# Patient Record
Sex: Female | Born: 1935 | Race: White | Hispanic: No | Smoking: Former smoker
Health system: Southern US, Community
[De-identification: ages and names within clinical notes are randomized; demographics above are authoritative.]

## PROBLEM LIST (undated history)

## (undated) DIAGNOSIS — IMO0002 Reserved for concepts with insufficient information to code with codable children: Secondary | ICD-10-CM

## (undated) DIAGNOSIS — I639 Cerebral infarction, unspecified: Secondary | ICD-10-CM

## (undated) DIAGNOSIS — N200 Calculus of kidney: Secondary | ICD-10-CM

## (undated) DIAGNOSIS — I10 Essential (primary) hypertension: Secondary | ICD-10-CM

## (undated) DIAGNOSIS — E7800 Pure hypercholesterolemia, unspecified: Secondary | ICD-10-CM

## (undated) HISTORY — DX: Essential (primary) hypertension: I10

## (undated) HISTORY — DX: Calculus of kidney: N20.0

## (undated) HISTORY — DX: Pure hypercholesterolemia, unspecified: E78.00

---

## 1965-02-23 HISTORY — PX: BREAST LUMPECTOMY: SHX2

## 1997-08-28 ENCOUNTER — Emergency Department (HOSPITAL_COMMUNITY): Admission: EM | Admit: 1997-08-28 | Discharge: 1997-08-28 | Payer: Self-pay | Admitting: Emergency Medicine

## 2005-11-03 ENCOUNTER — Ambulatory Visit: Payer: Self-pay | Admitting: Gastroenterology

## 2005-11-18 ENCOUNTER — Encounter (INDEPENDENT_AMBULATORY_CARE_PROVIDER_SITE_OTHER): Payer: Self-pay | Admitting: Specialist

## 2005-11-18 ENCOUNTER — Ambulatory Visit: Payer: Self-pay | Admitting: Gastroenterology

## 2006-03-23 ENCOUNTER — Encounter: Admission: RE | Admit: 2006-03-23 | Discharge: 2006-03-29 | Payer: Self-pay | Admitting: Internal Medicine

## 2006-10-06 ENCOUNTER — Encounter (HOSPITAL_COMMUNITY): Admission: RE | Admit: 2006-10-06 | Discharge: 2006-11-29 | Payer: Self-pay | Admitting: Internal Medicine

## 2007-11-23 ENCOUNTER — Ambulatory Visit (HOSPITAL_COMMUNITY): Admission: RE | Admit: 2007-11-23 | Discharge: 2007-11-23 | Payer: Self-pay | Admitting: Internal Medicine

## 2008-06-20 ENCOUNTER — Emergency Department (HOSPITAL_COMMUNITY): Admission: EM | Admit: 2008-06-20 | Discharge: 2008-06-20 | Payer: Self-pay | Admitting: Family Medicine

## 2008-10-09 ENCOUNTER — Encounter (INDEPENDENT_AMBULATORY_CARE_PROVIDER_SITE_OTHER): Payer: Self-pay | Admitting: *Deleted

## 2008-11-27 ENCOUNTER — Ambulatory Visit (HOSPITAL_COMMUNITY): Admission: RE | Admit: 2008-11-27 | Discharge: 2008-11-27 | Payer: Self-pay | Admitting: Internal Medicine

## 2009-04-25 ENCOUNTER — Telehealth: Payer: Self-pay | Admitting: Gastroenterology

## 2010-03-27 NOTE — Progress Notes (Signed)
Summary: Schedule Colonoscopy  Phone Note Outgoing Call Call back at [facility] Phone [phone]   Call placed by: Harlow Mares CMA Duncan Dull),  April 25, 2009 3:00 PM Call placed to: Patient Summary of Call: Left message on patients machine to call back. patient due for colonoscopy Initial call taken by: Harlow Mares CMA Duncan Dull),  April 25, 2009 3:00 PM  Follow-up for Phone Call        Has decided she is NOT going to have the Colonoscopy done. Did not want to give me a reason just repeated twice that she was not going to do it.  Follow-up by: Leanor Kail [facility] [place],  April 26, 2009 11:53 AM

## 2010-07-11 NOTE — Assessment & Plan Note (Signed)
 HEALTHCARE                           GASTROENTEROLOGY OFFICE NOTE   Jane Farley, Jane Farley                       MRN:          [id number]  DATE:11/03/2005                            DOB:          [birth year]    REASON FOR CONSULTATION:  Rectal bleeding.   Mrs. Jane Farley is a pleasant 77 year old white female referred through the  courtesy of Dr. Wylene Simmer, for evaluation.  Since her pregnancy 35 years ago,  she has been suffering from intermittent rectal bleeding consisting of blood  on the toilet tissue.  She may have protrusion of hemorrhoids with mild pain  as well.  Recently she passed some mucus along with blood.  Bowels are  regular.  She denies abdominal pain or melena.   PAST MEDICAL HISTORY:  Unremarkable.   FAMILY HISTORY:  Pertinent for 2 siblings and father with heart disease.   MEDICATIONS:  Boniva, calcium.   SHE HAS NO ALLERGIES.   She neither smokes nor drinks.  She is widowed and retired.   REVIEW OF SYSTEMS:  Reviewed and is negative.   PHYSICAL EXAMINATION:  VITAL SIGNS:  Pulse 64, blood pressure 168/98, weight  123.  HEENT: EOMI. PERRLA. Sclerae are anicteric.  Conjunctivae are pink.  NECK:  Supple without thyromegaly, adenopathy or carotid bruits.  CHEST:  Clear to auscultation and percussion without adventitious sounds.  CARDIAC:  Regular rhythm; normal S1 S2.  There are no murmurs, gallops or  rubs.  ABDOMEN:  Bowel sounds are normoactive.  Abdomen is soft, non-tender and non-  distended.  There are no abdominal masses, tenderness, splenic enlargement  or hepatomegaly.  EXTREMITIES:  Full range of motion.  No cyanosis, clubbing or edema.  RECTAL:  She has small external skin tags and a grade 3 hemorrhoid.  There  are no masses.  Stool is Hemoccult negative.   IMPRESSION:  Rectal bleeding probably secondary to hemorrhoids.   RECOMMENDATION:  1. AnaMantle p.r. q.h.s. for 7 days.  2. Colonoscopy.               Barbette Hair. Arlyce Dice, MD,FACG   RDK/MedQ  DD:  11/03/2005  DT:  11/03/2005  Job #:  [id number]   cc:   Gaspar Garbe, M.D.

## 2010-07-11 NOTE — Letter (Signed)
November 03, 2005     Ms. Jane Farley  [address]  [place], [place]  [address]   RE:  [name], [name]  MRN:  [id number]  /  DOB:  October 09, 1932   Dear Ms. Jane Farley:   It is my pleasure to have treated you recently as a new patient in my  office.  I appreciate your confidence and the opportunity to participate in  your care.   Since I do have a busy inpatient endoscopy schedule and office schedule, my  office hours vary weekly.  I am, however, available for emergency calls  every day through my office.  If I cannot promptly meet an urgent office  appointment, another one of our gastroenterologists will be able to assist  you.   My well-trained staff are prepared to help you at all times.  For  emergencies after office hours, a physician from our gastroenterology  section is always available through my 24-hour answering service.   While you are under my care, I encourage discussion of your questions and  concerns, and I will be happy to return your calls as soon as I am  available.   Once again, I welcome you as a new patient and I look forward to a happy and  healthy relationship.   Sincerely,     Jane Farley. Jane Dice, MD,FACG   RDK/MedQ  DD:  11/03/2005 DT:  11/03/2005 Job #:  [id number]

## 2010-07-11 NOTE — Letter (Signed)
November 03, 2005     Gaspar Garbe, M.D.  [address]  [place], [place] [address]   RE:  Jane Farley, Jane Farley  MRN:  [id number]  /  DOB:  Nov 13, 1932   Upon your kind referral, I had the pleasure of evaluating your patient and I  am pleased to offer my findings. I saw Dariane Natzke in the office today.  Enclosed is a copy of my progress note that details my findings and  recommendations.   Thank you for the opportunity to participate in your patient's care.    Sincerely,      Barbette Hair. Arlyce Dice, MD,FACG   RDK/MedQ  DD:  11/03/2005  DT:  11/03/2005  Job #:  [id number]

## 2010-07-16 ENCOUNTER — Ambulatory Visit (HOSPITAL_COMMUNITY): Payer: Medicare Other | Attending: Internal Medicine

## 2010-07-16 DIAGNOSIS — M810 Age-related osteoporosis without current pathological fracture: Secondary | ICD-10-CM | POA: Insufficient documentation

## 2010-09-21 ENCOUNTER — Emergency Department (HOSPITAL_COMMUNITY)
Admission: EM | Admit: 2010-09-21 | Discharge: 2010-09-21 | Disposition: A | Discharge status: Home / Self Care | Payer: Medicare Other | Attending: Emergency Medicine | Admitting: Emergency Medicine

## 2010-09-21 ENCOUNTER — Inpatient Hospital Stay (INDEPENDENT_AMBULATORY_CARE_PROVIDER_SITE_OTHER)
Admission: RE | Admit: 2010-09-21 | Discharge: 2010-09-21 | Disposition: A | Discharge status: Home / Self Care | Payer: Medicare Other | Source: Ambulatory Visit | Attending: Family Medicine | Admitting: Family Medicine

## 2010-09-21 ENCOUNTER — Emergency Department (HOSPITAL_COMMUNITY): Payer: Medicare Other

## 2010-09-21 DIAGNOSIS — N134 Hydroureter: Secondary | ICD-10-CM | POA: Insufficient documentation

## 2010-09-21 DIAGNOSIS — I714 Abdominal aortic aneurysm, without rupture, unspecified: Secondary | ICD-10-CM | POA: Insufficient documentation

## 2010-09-21 DIAGNOSIS — R109 Unspecified abdominal pain: Secondary | ICD-10-CM

## 2010-09-21 DIAGNOSIS — E7800 Pure hypercholesterolemia, unspecified: Secondary | ICD-10-CM | POA: Insufficient documentation

## 2010-09-21 DIAGNOSIS — M810 Age-related osteoporosis without current pathological fracture: Secondary | ICD-10-CM | POA: Insufficient documentation

## 2010-09-21 DIAGNOSIS — N200 Calculus of kidney: Secondary | ICD-10-CM | POA: Insufficient documentation

## 2010-09-21 DIAGNOSIS — I10 Essential (primary) hypertension: Secondary | ICD-10-CM | POA: Insufficient documentation

## 2010-09-21 LAB — POCT I-STAT, CHEM 8
Calcium, Ion: 1.19 mmol/L (ref 1.12–1.32)
Chloride: 107 mEq/L (ref 96–112)
HCT: 38 % (ref 36.0–46.0)
Sodium: 140 mEq/L (ref 135–145)

## 2010-09-21 LAB — POCT URINALYSIS DIP (DEVICE)
Bilirubin Urine: NEGATIVE
Glucose, UA: 100 mg/dL — AB
Ketones, ur: NEGATIVE mg/dL
Leukocytes, UA: NEGATIVE
Nitrite: NEGATIVE
pH: 5 (ref 5.0–8.0)

## 2010-09-26 ENCOUNTER — Other Ambulatory Visit (HOSPITAL_COMMUNITY): Payer: Self-pay | Admitting: Internal Medicine

## 2010-09-26 DIAGNOSIS — R160 Hepatomegaly, not elsewhere classified: Secondary | ICD-10-CM

## 2010-10-01 ENCOUNTER — Ambulatory Visit (HOSPITAL_COMMUNITY)
Admission: RE | Admit: 2010-10-01 | Discharge: 2010-10-01 | Disposition: A | Discharge status: Home / Self Care | Payer: Medicare Other | Source: Ambulatory Visit | Attending: Internal Medicine | Admitting: Internal Medicine

## 2010-10-01 DIAGNOSIS — R160 Hepatomegaly, not elsewhere classified: Secondary | ICD-10-CM

## 2010-10-01 DIAGNOSIS — I714 Abdominal aortic aneurysm, without rupture, unspecified: Secondary | ICD-10-CM | POA: Insufficient documentation

## 2010-10-01 DIAGNOSIS — K7689 Other specified diseases of liver: Secondary | ICD-10-CM | POA: Insufficient documentation

## 2010-10-01 DIAGNOSIS — R1909 Other intra-abdominal and pelvic swelling, mass and lump: Secondary | ICD-10-CM | POA: Insufficient documentation

## 2010-10-01 MED ORDER — GADOBENATE DIMEGLUMINE 529 MG/ML IV SOLN: 5.0000 mL | Freq: Once | INTRAVENOUS | Status: DC

## 2010-10-02 ENCOUNTER — Telehealth: Payer: Self-pay | Admitting: Gastroenterology

## 2010-10-02 NOTE — Telephone Encounter (Signed)
Pt scheduled to see Willette Cluster NP 10/03/10 @9am . Malachi Bonds to notify pt of appt date and time and fax records.

## 2010-10-03 ENCOUNTER — Encounter: Payer: Self-pay | Admitting: Nurse Practitioner

## 2010-10-03 ENCOUNTER — Ambulatory Visit (INDEPENDENT_AMBULATORY_CARE_PROVIDER_SITE_OTHER): Payer: Medicare Other | Admitting: Nurse Practitioner

## 2010-10-03 VITALS — BP 110/70 | HR 80 | Ht 62.0 in | Wt 128.0 lb

## 2010-10-03 DIAGNOSIS — R933 Abnormal findings on diagnostic imaging of other parts of digestive tract: Secondary | ICD-10-CM

## 2010-10-03 DIAGNOSIS — Z1211 Encounter for screening for malignant neoplasm of colon: Secondary | ICD-10-CM

## 2010-10-03 NOTE — Patient Instructions (Addendum)
We have scheduled the Endoscopic Ultrasound with Dr. Rob Bunting at [facility]. Directions provided.

## 2010-10-03 NOTE — Progress Notes (Signed)
10/03/2010 ANAPAULA SEVERT [id number] 1932-11-16   HISTORY OF PRESENT ILLNESS: Ms. Peeler is a 77 year old female who has not been seen here in several years but was formally followed by Dr. Arlyce Dice. She comes in for evaluation of an abnormal MRI of the abdomen. A few days ago the patient went to the emergency department with severe abdominal/flank pain, was found to have kidney stone which he later passed at home. CT urogram.incidental hepatic lesions. MRI done for followup and suggested that the hepatic lesions were actually hemangiomas but there was an incidental finding of an enhancing soft tissue mass located in the gastric body and left hepatic lobe. Findings discussed C4 disc. Patient had no abdominal pain or weight loss. No nausea vomiting. No signs of gastrointestinal bleeding.   Past Medical History  Diagnosis Date  . Hemorrhoids   . Nephrolithiasis   . Hypercholesterolemia   . Hypertension   . Osteoporosis    Past Surgical History  Procedure Date  . Breast lumpectomy [id number]    left    reports that she quit smoking about 8 years ago. She has never used smokeless tobacco. She reports that she does not drink alcohol or use illicit drugs. family history includes Diabetes in her brother and father. No Known Allergies    Outpatient Encounter Prescriptions as of 10/03/2010  Medication Sig Dispense Refill  . calcium carbonate (OS-CAL) 600 MG TABS Take 600 mg by mouth. Once daily       . losartan (COZAAR) 100 MG tablet Take 100 mg by mouth daily.        . simvastatin (ZOCOR) 20 MG tablet Take 20 mg by mouth at bedtime.        . Zoledronic Acid (RECLAST IV) Inject into the vein. Once a year          REVIEW OF SYSTEMS  : All other systems reviewed and negative except where noted in the History of Present Illness.   PHYSICAL EXAM: General: Well developed white female in no acute distress Head: Normocephalic and atraumatic Eyes:  sclerae anicteric,conjunctive pink. Ears: Normal  auditory acuity Mouth: No deformity or lesions Neck: Supple, no masses.  Lungs: Clear throughout to auscultation Heart: Regular rate and rhythm; no murmurs heard Abdomen: Soft, non distended, nontender. No masses or hepatomegaly noted. Normal Bowel sounds Rectal: not done Musculoskeletal: Symmetrical with no gross deformities  Skin: No lesions on visible extremities Extremities: No edema or deformities noted Neurological: Alert oriented x 4, grossly nonfocal Cervical Nodes:  No significant cervical adenopathy Psychological:  Alert and cooperative. Normal mood and affect  ASSESSMENT AND PLAN;

## 2010-10-03 NOTE — Assessment & Plan Note (Signed)
Our office call the patient March 2011 to schedule her call colonoscopy but patient declined. She may revisit this in the future once evaluation of abnormal MRI of the abdomen is complete and any necessary treatment carried out.

## 2010-10-03 NOTE — Assessment & Plan Note (Addendum)
Incidental finding on MRI of the abdomen:  Soft tissue mass between the gastric body and left hepatic lobe measuring 1.6 x 2.4 x 2.0 cm. This lesion demonstrates heterogeneous T2 signal and nonspecific enhancement. 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 No significant gastric wall thickening is seen. Patient will need an upper endoscopic ultrasound. I spoke with Dr. Christella Hartigan in our practice, he reviewed the MRI, patient will be scheduled for EUS  Of note: the MRI was done to followup on hepatic lesions seen on a CT urogram. The liver lesions felt to represent hemangiomas.

## 2010-10-06 NOTE — Progress Notes (Signed)
I AGREE.

## 2010-10-09 ENCOUNTER — Encounter: Payer: Medicare Other | Admitting: Gastroenterology

## 2010-10-09 ENCOUNTER — Ambulatory Visit (HOSPITAL_COMMUNITY)
Admission: RE | Admit: 2010-10-09 | Discharge: 2010-10-09 | Disposition: A | Discharge status: Home / Self Care | Payer: Medicare Other | Source: Ambulatory Visit | Attending: Gastroenterology | Admitting: Gastroenterology

## 2010-10-09 ENCOUNTER — Other Ambulatory Visit: Payer: Self-pay | Admitting: Gastroenterology

## 2010-10-09 DIAGNOSIS — Z79899 Other long term (current) drug therapy: Secondary | ICD-10-CM | POA: Insufficient documentation

## 2010-10-09 DIAGNOSIS — K319 Disease of stomach and duodenum, unspecified: Secondary | ICD-10-CM | POA: Insufficient documentation

## 2010-10-09 DIAGNOSIS — I10 Essential (primary) hypertension: Secondary | ICD-10-CM | POA: Insufficient documentation

## 2010-10-09 DIAGNOSIS — R1906 Epigastric swelling, mass or lump: Secondary | ICD-10-CM | POA: Insufficient documentation

## 2010-10-09 DIAGNOSIS — E7800 Pure hypercholesterolemia, unspecified: Secondary | ICD-10-CM | POA: Insufficient documentation

## 2010-10-09 DIAGNOSIS — R933 Abnormal findings on diagnostic imaging of other parts of digestive tract: Secondary | ICD-10-CM

## 2010-10-14 ENCOUNTER — Telehealth: Payer: Self-pay

## 2010-10-14 ENCOUNTER — Other Ambulatory Visit: Payer: Self-pay | Admitting: Gastroenterology

## 2010-10-14 DIAGNOSIS — K3189 Other diseases of stomach and duodenum: Secondary | ICD-10-CM

## 2010-10-14 NOTE — Telephone Encounter (Signed)
Pt aware.

## 2010-10-14 NOTE — Telephone Encounter (Signed)
Message copied by Donata Duff on Tue Oct 14, 2010  2:01 PM ------      Message from: Marnette Burgess      Created: Tue Oct 14, 2010  1:52 PM      Regarding: Appointment      Contact: [phone]       Patient is scheduled to see Dr. Violeta Gelinas on 10/29/10 @ 9:20A.M. And needs to arrive at 9:00A.M., pt has not been notified, if you have any questions please call [phone].            Thanks,      Elane Fritz      ----- Message -----         From: Chales Abrahams, CMA         Sent: 10/14/2010  12:55 PM           To: Marnette Burgess            referral to [facility] to consider resection of the mass next to stomach, liver.  Probably can be done laparascopically.                     Can you schedule Elane Fritz thanks

## 2010-10-29 ENCOUNTER — Encounter (INDEPENDENT_AMBULATORY_CARE_PROVIDER_SITE_OTHER): Payer: Self-pay | Admitting: General Surgery

## 2010-10-29 ENCOUNTER — Ambulatory Visit (INDEPENDENT_AMBULATORY_CARE_PROVIDER_SITE_OTHER): Payer: Medicare Other | Admitting: General Surgery

## 2010-10-29 VITALS — BP 170/98 | HR 64 | Temp 97.1°F | Ht 62.0 in | Wt 131.2 lb

## 2010-10-29 DIAGNOSIS — D214 Benign neoplasm of connective and other soft tissue of abdomen: Secondary | ICD-10-CM

## 2010-10-29 NOTE — Progress Notes (Signed)
Subjective:     Patient ID: Jane Farley, female   DOB: Mar 02, 1932, 77 y.o.   MRN: [id number]  HPI  Patient is referred for a stomach mass. Patient developed renal calculi and was seen in emergency department. At that time CT scan demonstrated a stone mass. Further evaluation with upper endoscopy by [facility] was done. MR of the abdomen was then completed. This demonstrates a mass in the stomach juxtaposed to the left hepatic lobe consistent with GI ST tumor. The patient has had no abdominal pain since resolution of her renal calculi. She is eating fine. She is moving her bowels without difficulty. She's had no blood in stools. Review of Systems  Constitutional: Negative.   HENT: Negative.   Eyes: Negative.   Respiratory: Negative.   Cardiovascular: Negative.  Negative for chest pain.  Gastrointestinal: Negative.  Negative for abdominal distention.  Musculoskeletal: Negative.   Neurological: Negative.   Hematological: Negative.        Objective:   Physical Exam  Constitutional: She is oriented to person, place, and time. She appears well-developed and well-nourished.  HENT:  Head: Normocephalic and atraumatic.  Eyes: Conjunctivae are normal. Pupils are equal, round, and reactive to light. Right eye exhibits no discharge.  Neck: Normal range of motion. Neck supple. No tracheal deviation present.  Cardiovascular: Normal rate and regular rhythm.  Exam reveals no friction rub.   Pulmonary/Chest: Effort normal. No respiratory distress. She has no wheezes. She has no rales. She exhibits no tenderness.  Abdominal: Soft. She exhibits no distension. There is no tenderness. There is no rebound and no guarding.  Musculoskeletal: Normal range of motion. She exhibits no tenderness.  Neurological: She is alert and oriented to person, place, and time.  Skin: Skin is warm and dry.  Lymph exam reveals no palpable cervical, supraclavicular, periumbilical, or inguinal lymphadenopathy bilaterally.   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 This will require general anesthesia and hospitalization. Procedure risks and benefits were discussed in some detail with the patient and her friend accompanies her today. These included but were not limited to bleeding, infection, injury to other organs, anastomotic leaks. In light of her incompletely controlled hypertension, we will have Dr. Wylene Simmer see her for preoperative evaluation, optimization of her blood pressure control, and clearance. We look forward to scheduling surgery shortly thereafter. I will be happy to see the patient again if she has further questions, otherwise we will see to scheduling at that time.

## 2010-10-29 NOTE — Patient Instructions (Signed)
We will request medical clearance from Dr Wylene Simmer and ask him to evaluate your blood pressure.  Once we get the clearance for surgery, we will schedule you and let you know.  Do not take any aspirin or blood thinners 5 days preop.

## 2010-11-07 ENCOUNTER — Telehealth (INDEPENDENT_AMBULATORY_CARE_PROVIDER_SITE_OTHER): Payer: Self-pay | Admitting: General Surgery

## 2010-11-07 NOTE — Telephone Encounter (Signed)
I have the clearance but Dr Janee Morn has been out of the office.  Orders are not ready but we should be able to get them Monday from him.

## 2010-12-01 ENCOUNTER — Encounter (HOSPITAL_COMMUNITY)
Admission: RE | Admit: 2010-12-01 | Discharge: 2010-12-01 | Disposition: A | Discharge status: Home / Self Care | Payer: Medicare Other | Source: Ambulatory Visit | Attending: General Surgery | Admitting: General Surgery

## 2010-12-01 ENCOUNTER — Other Ambulatory Visit (INDEPENDENT_AMBULATORY_CARE_PROVIDER_SITE_OTHER): Payer: Self-pay | Admitting: General Surgery

## 2010-12-01 DIAGNOSIS — K3189 Other diseases of stomach and duodenum: Secondary | ICD-10-CM

## 2010-12-01 LAB — CBC
Hemoglobin: 12.7 g/dL (ref 12.0–15.0)
MCV: 90.2 fL (ref 78.0–100.0)
Platelets: 268 10*3/uL (ref 150–400)
RBC: 4.27 MIL/uL (ref 3.87–5.11)
WBC: 7.3 10*3/uL (ref 4.0–10.5)

## 2010-12-01 LAB — BASIC METABOLIC PANEL
CO2: 27 mEq/L (ref 19–32)
Chloride: 101 mEq/L (ref 96–112)
Creatinine, Ser: 0.71 mg/dL (ref 0.50–1.10)
Sodium: 138 mEq/L (ref 135–145)

## 2010-12-01 LAB — SURGICAL PCR SCREEN
MRSA, PCR: NEGATIVE
Staphylococcus aureus: NEGATIVE

## 2010-12-02 NOTE — Progress Notes (Signed)
I called and spoke to [facility] at [facility] preop and told her labs and xray are ok for or.

## 2010-12-08 ENCOUNTER — Inpatient Hospital Stay (HOSPITAL_COMMUNITY)
Admission: RE | Admit: 2010-12-08 | Discharge: 2010-12-12 | DRG: 357 | Disposition: A | Discharge status: Home / Self Care | Payer: Medicare Other | Source: Ambulatory Visit | Attending: General Surgery | Admitting: General Surgery

## 2010-12-08 ENCOUNTER — Other Ambulatory Visit (INDEPENDENT_AMBULATORY_CARE_PROVIDER_SITE_OTHER): Payer: Self-pay | Admitting: General Surgery

## 2010-12-08 DIAGNOSIS — K560 Paralytic ileus: Secondary | ICD-10-CM | POA: Diagnosis not present

## 2010-12-08 DIAGNOSIS — Y838 Other surgical procedures as the cause of abnormal reaction of the patient, or of later complication, without mention of misadventure at the time of the procedure: Secondary | ICD-10-CM | POA: Diagnosis not present

## 2010-12-08 DIAGNOSIS — Z01812 Encounter for preprocedural laboratory examination: Secondary | ICD-10-CM

## 2010-12-08 DIAGNOSIS — K929 Disease of digestive system, unspecified: Secondary | ICD-10-CM | POA: Diagnosis not present

## 2010-12-08 DIAGNOSIS — Z01810 Encounter for preprocedural cardiovascular examination: Secondary | ICD-10-CM

## 2010-12-08 DIAGNOSIS — Y9210 Unspecified residential institution as the place of occurrence of the external cause: Secondary | ICD-10-CM | POA: Diagnosis not present

## 2010-12-08 DIAGNOSIS — R1909 Other intra-abdominal and pelvic swelling, mass and lump: Principal | ICD-10-CM | POA: Diagnosis present

## 2010-12-08 DIAGNOSIS — R110 Nausea: Secondary | ICD-10-CM | POA: Diagnosis not present

## 2010-12-08 DIAGNOSIS — Z01818 Encounter for other preprocedural examination: Secondary | ICD-10-CM

## 2010-12-08 DIAGNOSIS — R5381 Other malaise: Secondary | ICD-10-CM | POA: Diagnosis not present

## 2010-12-08 DIAGNOSIS — D367 Benign neoplasm of other specified sites: Secondary | ICD-10-CM

## 2010-12-08 HISTORY — PX: EXPLORATORY LAPAROTOMY WITH ABDOMINAL MASS EXCISION: SHX5169

## 2010-12-09 NOTE — Op Note (Signed)
NAMELASHAWNNA, LAMBRECHT NO.:  [id number]  MEDICAL RECORD NO.:  [id number]  LOCATION:  5127                         FACILITY:  [facility]  PHYSICIAN:  Gabrielle Dare. Janee Morn, M.D.DATE OF BIRTH:  Apr 05, 1932  DATE OF PROCEDURE:  12/08/2010 DATE OF DISCHARGE:                              OPERATIVE REPORT   PREOPERATIVE DIAGNOSIS:  Gastric mass.  POSTOPERATIVE DIAGNOSIS:  Mass in the falciform ligament.  PROCEDURES: 1. Exploratory laparotomies. 2. Excision of mass, falciform ligament 3. Esophagogastroduodenoscopy.  SURGEON:  Gabrielle Dare. Janee Morn, MD  ASSISTANT:  Anselm Pancoast. Zachery Dakins, MD  ANESTHESIA:  General endotracheal.  HISTORY OF PRESENT ILLNESS:  Ms. Waszak is a 77 year old female, who was noted on CT of the abdomen and pelvis to have a mass that appeared to be between the anterior wall of her stomach in the left lobe of her liver. MR of the abdomen and pelvis demonstrated this mass approximately 2 cm in size on the anterior gastric wall, to the left lobe of the liver. Upper endoscopy had previously been done by Dr. Christella Hartigan from [facility]. 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  The patient presents today for removal of this gastric mass.  PROCEDURE IN DETAIL:  Informed consent was obtained.  The patient was identified in the preop holding area.  She was brought to the operating room.  General endotracheal anesthesia was administered by the anesthesia staff.  She was prepped and draped in sterile fashion after Foley catheter was placed by nursing staff.  Time-out procedure was done.  Upper midline incision was made.  Subcutaneous tissues were dissected down revealing the anterior fascia.  This was divided along the midline and the peritoneal cavity was entered under direct vision without difficulty.  The fascia was opened to the length of  the incision.  Exploration revealed no mass directly on the anterior surface of the stomach; however, there was an approximately 2.2 x 2 cm mass lying on the anterior surface of the stomach arising from the falciform ligaments.  This was somewhat firm.  It had a thin pedicle coming off the falciform.  This was dissected down and margin of the normal falciform was taken and the mass was excised.  It was seemed well encapsulated.  The base of the pedicle was ligated with 2-0 silk.  There was excellent hemostasis.  The stomach was then further explored to make sure there were no other lesions.  The stomach was smooth and no other palpable masses.  The liver was smooth as well.  No other abnormalities were noted.  We then re-evaluated the stomach and again no palpable masses noted anywhere.  This appears to have been the lesion.  It is in the location consistent with the skin.  Decision was made at this time just to make sure that at this time the abdomen was first closed.  All counts were correct.  The fascia was closed with running looped #1 PDS from each end of the incision was closed in the middle.  Subcutaneous tissues were irrigated and skin was closed with staples.  Sponge, needle, and instrument counts were all correct.  Sterile dressing was applied.  We then proceeded with esophagogastroduodenoscopy.  The equipment was brought by the tech from endoscopy.  The upper endoscope was inserted via the patient's mouth down into the esophagus without difficulty.  The esophagus appeared grossly normal.  We advanced the scope into the stomach.  The stomach was insufflated.  There was no mass or discrete abnormality noted in the stomach.  Several pictures were taken.  The scope was advanced down to the second portion of the duodenum.  No ulcers or obstructing lesions or other masses were noted there.  The scope was withdrawn back into the stomach, and we took another look.  Again, no masses or  significant abnormalities were seen. The stomach was decompressed and the scope was removed.  The patient tolerated the procedure well without any apparent complications and was taken to the recovery room in stable condition.     Gabrielle Dare Janee Morn, M.D.     BET/MEDQ  D:  12/08/2010  T:  12/09/2010  Job:  [id number]  cc:   Rachael Fee, MD Gaspar Garbe, M.D. Barbette Hair. Arlyce Dice, MD,FACG  Electronically Signed by Violeta Gelinas M.D. on 12/09/2010 01:32:19 PM

## 2010-12-11 DIAGNOSIS — R5381 Other malaise: Secondary | ICD-10-CM

## 2010-12-15 ENCOUNTER — Encounter (INDEPENDENT_AMBULATORY_CARE_PROVIDER_SITE_OTHER): Payer: Self-pay

## 2010-12-17 ENCOUNTER — Ambulatory Visit (INDEPENDENT_AMBULATORY_CARE_PROVIDER_SITE_OTHER): Payer: Medicare Other | Admitting: General Surgery

## 2010-12-17 ENCOUNTER — Encounter (INDEPENDENT_AMBULATORY_CARE_PROVIDER_SITE_OTHER): Payer: Self-pay | Admitting: General Surgery

## 2010-12-17 VITALS — BP 138/90 | HR 60 | Temp 97.5°F | Resp 24 | Wt 129.0 lb

## 2010-12-17 DIAGNOSIS — R1900 Intra-abdominal and pelvic swelling, mass and lump, unspecified site: Secondary | ICD-10-CM

## 2010-12-17 NOTE — Progress Notes (Signed)
Subjective:     Patient ID: Jane Farley, female   DOB: August 08, 1932, 77 y.o.   MRN: [id number]  HPI  Patient is status post resection of mass falciform ligament. This was a benign vascular tumor. She's been doing well postoperatively. She is not taking any pain medication. She is eating well and moving her bowels. She is sleeping much better than she was in the hospital. Review of Systems     Objective:   Physical Exam On exam her upper midline incision is well-healed. Her abdomen is soft and nontender. The staples were removed from her wound and Steri-Strips were placed.    Assessment:     Doing well Status post resection of mass falciform ligament.   Plan:     No heavy lifting for 6 weeks after surgery. We will plan to see her back as needed.

## 2010-12-17 NOTE — Patient Instructions (Signed)
No lifting for 6 weeks after surgery

## 2010-12-21 NOTE — Discharge Summary (Signed)
  NAMEALEXIYA, Farley NO.:  [id number]  MEDICAL RECORD NO.:  [id number]  LOCATION:  5127                         FACILITY:  [facility]  PHYSICIAN:  Gabrielle Dare. Janee Morn, M.D.DATE OF BIRTH:  1932-03-05  DATE OF ADMISSION:  12/08/2010 DATE OF DISCHARGE:                              DISCHARGE SUMMARY   DISCHARGE DIAGNOSIS:  Status post resection of mass, falciform ligament.  HISTORY OF PRESENT ILLNESS:  Ms. Chestine Farley is a 77 year old female who was noted on CT scan of the abdomen and pelvis to have a mass that appeared to arise from the anterior wall of her stomach.  Upper endoscopy was unrevealing and she underwent MR.  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  HOSPITAL COURSE:  The patient underwent exploration for operative resection.  It was found that this mass actually rose from the falciform ligament.  It was excised with a good margin.  There were no masses of the stomach itself found intraoperatively.  At the completion of that procedure, I also performed an  EGD confirming no intragastric abnormalities.  This was consistent with Dr. Birdena Crandall findings. Postoperatively, the patient remained afebrile and hemodynamically stable.  She initially had some nausea but her mild postoperative ileus resolved.  She tolerated advancement of her diet and is discharged home on postop day 4 in stable condition.  Her pathology report revealed a benign vascular proliferation consistent with either a hemangioma or an AVM.  There were no malignant features.  DISCHARGED DIET:  Regular.  DISCHARGE ACTIVITY:  No lifting.  DISCHARGE MEDICATIONS:  Hydrocodone/APAP 5/325 one to two p.o. q.4 hours as needed for pain.  The patient is also to continue her home medications including losartan, NyQuil, calcium carbonate, Reclast, and simvastatin.  FOLLOWUP:  Follow up in 1 week with  myself.     Gabrielle Dare Janee Morn, M.D.     BET/MEDQ  D:  12/12/2010  T:  12/12/2010  Job:  [id number]  cc:   Rachael Fee, MD Gaspar Garbe, M.D. Barbette Hair. Arlyce Dice, MD,FACG  Electronically Signed by Violeta Gelinas M.D. on 12/21/2010 08:50:09 AM

## 2011-03-17 DIAGNOSIS — R3129 Other microscopic hematuria: Secondary | ICD-10-CM | POA: Diagnosis not present

## 2011-03-17 DIAGNOSIS — N201 Calculus of ureter: Secondary | ICD-10-CM | POA: Diagnosis not present

## 2011-06-16 DIAGNOSIS — R3129 Other microscopic hematuria: Secondary | ICD-10-CM | POA: Diagnosis not present

## 2011-11-05 ENCOUNTER — Other Ambulatory Visit: Payer: Self-pay | Admitting: Dermatology

## 2011-11-05 DIAGNOSIS — C44390 Other specified malignant neoplasm of skin of unspecified parts of face: Secondary | ICD-10-CM | POA: Diagnosis not present

## 2011-11-05 DIAGNOSIS — D485 Neoplasm of uncertain behavior of skin: Secondary | ICD-10-CM | POA: Diagnosis not present

## 2011-11-05 DIAGNOSIS — C44319 Basal cell carcinoma of skin of other parts of face: Secondary | ICD-10-CM | POA: Diagnosis not present

## 2011-12-30 DIAGNOSIS — M810 Age-related osteoporosis without current pathological fracture: Secondary | ICD-10-CM | POA: Diagnosis not present

## 2012-01-11 DIAGNOSIS — Z961 Presence of intraocular lens: Secondary | ICD-10-CM | POA: Diagnosis not present

## 2012-01-11 DIAGNOSIS — H52209 Unspecified astigmatism, unspecified eye: Secondary | ICD-10-CM | POA: Diagnosis not present

## 2012-01-13 DIAGNOSIS — M810 Age-related osteoporosis without current pathological fracture: Secondary | ICD-10-CM | POA: Diagnosis not present

## 2012-01-13 DIAGNOSIS — R82998 Other abnormal findings in urine: Secondary | ICD-10-CM | POA: Diagnosis not present

## 2012-01-13 DIAGNOSIS — E785 Hyperlipidemia, unspecified: Secondary | ICD-10-CM | POA: Diagnosis not present

## 2012-01-13 DIAGNOSIS — I10 Essential (primary) hypertension: Secondary | ICD-10-CM | POA: Diagnosis not present

## 2012-01-18 DIAGNOSIS — N200 Calculus of kidney: Secondary | ICD-10-CM | POA: Diagnosis not present

## 2012-01-18 DIAGNOSIS — E785 Hyperlipidemia, unspecified: Secondary | ICD-10-CM | POA: Diagnosis not present

## 2012-01-18 DIAGNOSIS — Z1331 Encounter for screening for depression: Secondary | ICD-10-CM | POA: Diagnosis not present

## 2012-01-18 DIAGNOSIS — Z0000 Encounter for general adult medical examination without abnormal findings: Secondary | ICD-10-CM | POA: Diagnosis not present

## 2012-01-18 DIAGNOSIS — I10 Essential (primary) hypertension: Secondary | ICD-10-CM | POA: Diagnosis not present

## 2012-01-19 DIAGNOSIS — Z1231 Encounter for screening mammogram for malignant neoplasm of breast: Secondary | ICD-10-CM | POA: Diagnosis not present

## 2012-01-25 DIAGNOSIS — Z1212 Encounter for screening for malignant neoplasm of rectum: Secondary | ICD-10-CM | POA: Diagnosis not present

## 2012-01-27 ENCOUNTER — Other Ambulatory Visit (HOSPITAL_COMMUNITY): Payer: Self-pay | Admitting: *Deleted

## 2012-01-27 DIAGNOSIS — N63 Unspecified lump in unspecified breast: Secondary | ICD-10-CM | POA: Diagnosis not present

## 2012-01-27 DIAGNOSIS — Z853 Personal history of malignant neoplasm of breast: Secondary | ICD-10-CM | POA: Diagnosis not present

## 2012-01-28 ENCOUNTER — Encounter (HOSPITAL_COMMUNITY): Payer: Medicare Other

## 2012-02-03 ENCOUNTER — Ambulatory Visit (HOSPITAL_COMMUNITY)
Admission: RE | Admit: 2012-02-03 | Discharge: 2012-02-03 | Disposition: A | Discharge status: Home / Self Care | Payer: Medicare Other | Source: Ambulatory Visit | Attending: Internal Medicine | Admitting: Internal Medicine

## 2012-02-03 DIAGNOSIS — M810 Age-related osteoporosis without current pathological fracture: Secondary | ICD-10-CM | POA: Insufficient documentation

## 2012-02-03 MED ORDER — ZOLEDRONIC ACID 5 MG/100ML IV SOLN
INTRAVENOUS | Status: AC
  Administered 2012-02-03: 5 mg via INTRAVENOUS
  Filled 2012-02-03: qty 100

## 2012-02-03 MED ORDER — ZOLEDRONIC ACID 5 MG/100ML IV SOLN
5.0000 mg | Freq: Once | INTRAVENOUS | Status: AC
  Administered 2012-02-03: 5 mg via INTRAVENOUS

## 2012-02-05 ENCOUNTER — Inpatient Hospital Stay (HOSPITAL_COMMUNITY): Admission: RE | Admit: 2012-02-05 | Payer: Medicare Other | Source: Ambulatory Visit

## 2012-02-08 DIAGNOSIS — L570 Actinic keratosis: Secondary | ICD-10-CM | POA: Diagnosis not present

## 2012-02-08 DIAGNOSIS — Z85828 Personal history of other malignant neoplasm of skin: Secondary | ICD-10-CM | POA: Diagnosis not present

## 2012-02-29 DIAGNOSIS — Z23 Encounter for immunization: Secondary | ICD-10-CM | POA: Diagnosis not present

## 2012-05-09 DIAGNOSIS — Z85828 Personal history of other malignant neoplasm of skin: Secondary | ICD-10-CM | POA: Diagnosis not present

## 2012-06-06 DIAGNOSIS — R5381 Other malaise: Secondary | ICD-10-CM | POA: Diagnosis not present

## 2012-06-06 DIAGNOSIS — IMO0002 Reserved for concepts with insufficient information to code with codable children: Secondary | ICD-10-CM | POA: Diagnosis not present

## 2012-06-06 DIAGNOSIS — I10 Essential (primary) hypertension: Secondary | ICD-10-CM | POA: Diagnosis not present

## 2012-06-06 DIAGNOSIS — G459 Transient cerebral ischemic attack, unspecified: Secondary | ICD-10-CM | POA: Diagnosis not present

## 2012-06-06 DIAGNOSIS — E785 Hyperlipidemia, unspecified: Secondary | ICD-10-CM | POA: Diagnosis not present

## 2012-06-08 DIAGNOSIS — G459 Transient cerebral ischemic attack, unspecified: Secondary | ICD-10-CM | POA: Diagnosis not present

## 2012-06-08 DIAGNOSIS — I658 Occlusion and stenosis of other precerebral arteries: Secondary | ICD-10-CM | POA: Diagnosis not present

## 2012-06-08 DIAGNOSIS — G9389 Other specified disorders of brain: Secondary | ICD-10-CM | POA: Diagnosis not present

## 2012-06-09 DIAGNOSIS — I10 Essential (primary) hypertension: Secondary | ICD-10-CM | POA: Diagnosis not present

## 2012-06-09 DIAGNOSIS — E785 Hyperlipidemia, unspecified: Secondary | ICD-10-CM | POA: Diagnosis not present

## 2012-06-09 DIAGNOSIS — I6350 Cerebral infarction due to unspecified occlusion or stenosis of unspecified cerebral artery: Secondary | ICD-10-CM | POA: Diagnosis not present

## 2012-06-09 DIAGNOSIS — IMO0002 Reserved for concepts with insufficient information to code with codable children: Secondary | ICD-10-CM | POA: Diagnosis not present

## 2012-06-13 ENCOUNTER — Other Ambulatory Visit (HOSPITAL_COMMUNITY): Payer: Self-pay | Admitting: Internal Medicine

## 2012-06-13 DIAGNOSIS — I639 Cerebral infarction, unspecified: Secondary | ICD-10-CM

## 2012-06-14 ENCOUNTER — Other Ambulatory Visit: Payer: Self-pay

## 2012-06-14 ENCOUNTER — Ambulatory Visit (HOSPITAL_COMMUNITY): Payer: Medicare Other | Attending: Cardiovascular Disease | Admitting: Radiology

## 2012-06-14 DIAGNOSIS — I6789 Other cerebrovascular disease: Secondary | ICD-10-CM

## 2012-06-14 DIAGNOSIS — I10 Essential (primary) hypertension: Secondary | ICD-10-CM | POA: Insufficient documentation

## 2012-06-14 DIAGNOSIS — E785 Hyperlipidemia, unspecified: Secondary | ICD-10-CM | POA: Insufficient documentation

## 2012-06-14 DIAGNOSIS — Z8673 Personal history of transient ischemic attack (TIA), and cerebral infarction without residual deficits: Secondary | ICD-10-CM | POA: Insufficient documentation

## 2012-06-14 DIAGNOSIS — I079 Rheumatic tricuspid valve disease, unspecified: Secondary | ICD-10-CM | POA: Diagnosis not present

## 2012-06-14 DIAGNOSIS — Z87891 Personal history of nicotine dependence: Secondary | ICD-10-CM | POA: Insufficient documentation

## 2012-06-14 DIAGNOSIS — I639 Cerebral infarction, unspecified: Secondary | ICD-10-CM

## 2012-06-14 NOTE — Progress Notes (Signed)
Echocardiogram performed.  

## 2012-06-15 ENCOUNTER — Encounter (HOSPITAL_COMMUNITY): Payer: Self-pay | Admitting: Internal Medicine

## 2012-06-15 NOTE — Progress Notes (Signed)
Echo Report faxed to Dr. Wylene Simmer

## 2012-06-23 DIAGNOSIS — H5340 Unspecified visual field defects: Secondary | ICD-10-CM | POA: Diagnosis not present

## 2012-06-23 DIAGNOSIS — H532 Diplopia: Secondary | ICD-10-CM | POA: Diagnosis not present

## 2012-06-23 DIAGNOSIS — H5010 Unspecified exotropia: Secondary | ICD-10-CM | POA: Diagnosis not present

## 2012-07-19 DIAGNOSIS — IMO0002 Reserved for concepts with insufficient information to code with codable children: Secondary | ICD-10-CM | POA: Diagnosis not present

## 2012-07-19 DIAGNOSIS — I10 Essential (primary) hypertension: Secondary | ICD-10-CM | POA: Diagnosis not present

## 2012-07-19 DIAGNOSIS — E785 Hyperlipidemia, unspecified: Secondary | ICD-10-CM | POA: Diagnosis not present

## 2012-07-19 DIAGNOSIS — R5383 Other fatigue: Secondary | ICD-10-CM | POA: Diagnosis not present

## 2012-07-19 DIAGNOSIS — I6350 Cerebral infarction due to unspecified occlusion or stenosis of unspecified cerebral artery: Secondary | ICD-10-CM | POA: Diagnosis not present

## 2012-07-19 DIAGNOSIS — M810 Age-related osteoporosis without current pathological fracture: Secondary | ICD-10-CM | POA: Diagnosis not present

## 2012-07-25 DIAGNOSIS — H532 Diplopia: Secondary | ICD-10-CM | POA: Diagnosis not present

## 2012-07-25 DIAGNOSIS — H5010 Unspecified exotropia: Secondary | ICD-10-CM | POA: Diagnosis not present

## 2012-07-28 DIAGNOSIS — Z09 Encounter for follow-up examination after completed treatment for conditions other than malignant neoplasm: Secondary | ICD-10-CM | POA: Diagnosis not present

## 2012-09-28 DIAGNOSIS — H532 Diplopia: Secondary | ICD-10-CM | POA: Diagnosis not present

## 2012-09-28 DIAGNOSIS — H5010 Unspecified exotropia: Secondary | ICD-10-CM | POA: Diagnosis not present

## 2012-10-03 ENCOUNTER — Other Ambulatory Visit: Payer: Self-pay | Admitting: Dermatology

## 2012-10-03 DIAGNOSIS — C44319 Basal cell carcinoma of skin of other parts of face: Secondary | ICD-10-CM | POA: Diagnosis not present

## 2012-10-03 DIAGNOSIS — D2330 Other benign neoplasm of skin of unspecified part of face: Secondary | ICD-10-CM | POA: Diagnosis not present

## 2012-10-11 ENCOUNTER — Emergency Department (INDEPENDENT_AMBULATORY_CARE_PROVIDER_SITE_OTHER)
Admission: EM | Admit: 2012-10-11 | Discharge: 2012-10-11 | Disposition: A | Discharge status: Nursing Home (Includes ICF) | Payer: Medicare Other | Source: Home / Self Care

## 2012-10-11 ENCOUNTER — Emergency Department (HOSPITAL_COMMUNITY)
Admission: EM | Admit: 2012-10-11 | Discharge: 2012-10-11 | Disposition: A | Discharge status: Home / Self Care | Payer: Medicare Other | Attending: Emergency Medicine | Admitting: Emergency Medicine

## 2012-10-11 ENCOUNTER — Encounter (HOSPITAL_COMMUNITY): Payer: Self-pay

## 2012-10-11 ENCOUNTER — Encounter (HOSPITAL_COMMUNITY): Payer: Self-pay | Admitting: Emergency Medicine

## 2012-10-11 ENCOUNTER — Emergency Department (HOSPITAL_COMMUNITY): Payer: Medicare Other

## 2012-10-11 DIAGNOSIS — Z8679 Personal history of other diseases of the circulatory system: Secondary | ICD-10-CM | POA: Diagnosis not present

## 2012-10-11 DIAGNOSIS — Z8673 Personal history of transient ischemic attack (TIA), and cerebral infarction without residual deficits: Secondary | ICD-10-CM

## 2012-10-11 DIAGNOSIS — Z7982 Long term (current) use of aspirin: Secondary | ICD-10-CM | POA: Insufficient documentation

## 2012-10-11 DIAGNOSIS — F29 Unspecified psychosis not due to a substance or known physiological condition: Secondary | ICD-10-CM | POA: Diagnosis not present

## 2012-10-11 DIAGNOSIS — R51 Headache: Secondary | ICD-10-CM | POA: Diagnosis not present

## 2012-10-11 DIAGNOSIS — Z79899 Other long term (current) drug therapy: Secondary | ICD-10-CM | POA: Diagnosis not present

## 2012-10-11 DIAGNOSIS — E7800 Pure hypercholesterolemia, unspecified: Secondary | ICD-10-CM | POA: Diagnosis not present

## 2012-10-11 DIAGNOSIS — Z87442 Personal history of urinary calculi: Secondary | ICD-10-CM | POA: Diagnosis not present

## 2012-10-11 DIAGNOSIS — I10 Essential (primary) hypertension: Secondary | ICD-10-CM | POA: Insufficient documentation

## 2012-10-11 DIAGNOSIS — Z87891 Personal history of nicotine dependence: Secondary | ICD-10-CM | POA: Insufficient documentation

## 2012-10-11 DIAGNOSIS — R410 Disorientation, unspecified: Secondary | ICD-10-CM

## 2012-10-11 LAB — POCT I-STAT, CHEM 8
BUN: 10 mg/dL (ref 6–23)
Creatinine, Ser: 0.6 mg/dL (ref 0.50–1.10)
Hemoglobin: 12.2 g/dL (ref 12.0–15.0)
Potassium: 3.8 mEq/L (ref 3.5–5.1)
Sodium: 140 mEq/L (ref 135–145)
TCO2: 23 mmol/L (ref 0–100)

## 2012-10-11 LAB — DIFFERENTIAL
Basophils Absolute: 0 10*3/uL (ref 0.0–0.1)
Basophils Relative: 0 % (ref 0–1)
Eosinophils Absolute: 0.1 10*3/uL (ref 0.0–0.7)
Eosinophils Relative: 1 % (ref 0–5)
Neutrophils Relative %: 77 % (ref 43–77)

## 2012-10-11 LAB — CBC
MCH: 30.4 pg (ref 26.0–34.0)
MCHC: 34.1 g/dL (ref 30.0–36.0)
MCV: 89.1 fL (ref 78.0–100.0)
Platelets: 229 10*3/uL (ref 150–400)
RBC: 4.14 MIL/uL (ref 3.87–5.11)
RDW: 13.3 % (ref 11.5–15.5)

## 2012-10-11 LAB — COMPREHENSIVE METABOLIC PANEL
ALT: 8 U/L (ref 0–35)
Albumin: 3.6 g/dL (ref 3.5–5.2)
Alkaline Phosphatase: 46 U/L (ref 39–117)
Calcium: 8.8 mg/dL (ref 8.4–10.5)
GFR calc Af Amer: >90 mL/min (ref >90)
Glucose, Bld: 104 mg/dL — ABNORMAL HIGH (ref 70–99)
Potassium: 4.4 mEq/L (ref 3.5–5.1)
Sodium: 139 mEq/L (ref 135–145)
Total Protein: 6.3 g/dL (ref 6.0–8.3)

## 2012-10-11 LAB — PROTIME-INR
INR: 0.95 (ref 0.00–1.49)
Prothrombin Time: 12.5 seconds (ref 11.6–15.2)

## 2012-10-11 LAB — GLUCOSE, CAPILLARY

## 2012-10-11 MED ORDER — MORPHINE SULFATE 4 MG/ML IJ SOLN
4.0000 mg | Freq: Once | INTRAMUSCULAR | Status: DC
  Filled 2012-10-11: qty 1

## 2012-10-11 MED ORDER — ACETAMINOPHEN 325 MG PO TABS
650.0000 mg | ORAL_TABLET | Freq: Once | ORAL | Status: AC
  Administered 2012-10-11: 650 mg via ORAL
  Filled 2012-10-11: qty 2

## 2012-10-11 MED ORDER — ONDANSETRON HCL 4 MG/2ML IJ SOLN
4.0000 mg | Freq: Once | INTRAMUSCULAR | Status: DC
  Filled 2012-10-11: qty 2

## 2012-10-11 MED ORDER — SODIUM CHLORIDE 0.9 % IV SOLN
Freq: Once | INTRAVENOUS | Status: AC
  Administered 2012-10-11: 10:00:00 via INTRAVENOUS

## 2012-10-11 NOTE — ED Notes (Signed)
[facility] called and given report. 

## 2012-10-11 NOTE — ED Notes (Signed)
Pt. Woke up with a headache between 2-3 am and also feeling tired.  Pt. Went to out urgent care and was transferred via [facility] for further eval.  [facility] reports pt. Ambulated to [place] without any difficulty.  Alert and oriented X4.  Continues to have the headache..  No neuro deficits noted.

## 2012-10-11 NOTE — ED Provider Notes (Signed)
CSN: [id number]     Arrival date & time 10/11/12  1055 History   First MD Initiated Contact with Patient 10/11/12 1057     Chief Complaint  Patient presents with  . Headache   HPI Comments: Pt woke up with a headache this am.  She has history of strokes and was concerned.  She went to an UC and was sent to the ED for further evaluation.  No slurred speech.  NO weakness or numbness.  No trouble with speech.  Patient is a 79 y.o. female presenting with headaches. The history is provided by the patient.  Headache Pain location:  Occipital Quality:  Sharp Radiates to:  Does not radiate Severity currently:  6/10 Onset quality: Pt woke up with a headache around 2am.  She does not usually have headaches. Timing:  Constant Chronicity:  New Similar to prior headaches: no   Context: not activity, not exposure to bright light, not caffeine, not eating, not stress and not loud noise   Worsened by:  Nothing tried Associated symptoms: no abdominal pain, no blurred vision, no dizziness, no fever, no focal weakness, no hearing loss, no nausea, no photophobia and no seizures     Past Medical History  Diagnosis Date  . Hemorrhoids   . Nephrolithiasis   . Hypercholesterolemia   . Hypertension   . Osteoporosis    Past Surgical History  Procedure Laterality Date  . Breast lumpectomy  02/23/1965    left  . Exploratory laparotomy with abdominal mass excision  12/08/2010   Family History  Problem Relation Age of Onset  . Diabetes Father   . Diabetes Brother    History  Substance Use Topics  . Smoking status: Former Smoker    Quit date: 03/04/2002  . Smokeless tobacco: Never Used  . Alcohol Use: No   OB History   Grav Para Term Preterm Abortions TAB SAB Ect Mult Living                 Review of Systems  Constitutional: Negative for fever.  HENT: Negative for hearing loss.   Eyes: Negative for blurred vision and photophobia.  Gastrointestinal: Negative for nausea and abdominal pain.   Neurological: Positive for headaches. Negative for dizziness, focal weakness and seizures.  All other systems reviewed and are negative.    Allergies  Review of patient's allergies indicates no known allergies.  Home Medications   Current Outpatient Rx  Name  Route  Sig  Dispense  Refill  . aspirin EC 81 MG tablet   Oral   Take 81 mg by mouth daily.         . calcium carbonate (OS-CAL) 600 MG TABS   Oral   Take 600 mg by mouth daily.          Marland Kitchen losartan (COZAAR) 100 MG tablet   Oral   Take 100 mg by mouth daily.           . simvastatin (ZOCOR) 20 MG tablet   Oral   Take 20 mg by mouth at bedtime.           . Zoledronic Acid (RECLAST IV)   Intravenous   Inject into the vein. Once a year           BP 113/73  Pulse 72  Temp(Src) 97.9 F (36.6 C) (Oral)  Resp 22  SpO2 96% Physical Exam  Nursing note and vitals reviewed. Constitutional: She is oriented to person, place, and time. She appears well-developed  and well-nourished. No distress.  HENT:  Head: Normocephalic and atraumatic.  Right Ear: External ear normal.  Left Ear: External ear normal.  Mouth/Throat: Oropharynx is clear and moist.  Eyes: Conjunctivae are normal. Right eye exhibits no discharge. Left eye exhibits no discharge. No scleral icterus.  Neck: Neck supple. No tracheal deviation present.  Cardiovascular: Normal rate, regular rhythm and intact distal pulses.   Pulmonary/Chest: Effort normal and breath sounds normal. No stridor. No respiratory distress. She has no wheezes. She has no rales.  Abdominal: Soft. Bowel sounds are normal. She exhibits no distension. There is no tenderness. There is no rebound and no guarding.  Musculoskeletal: She exhibits no edema and no tenderness.  Neurological: She is alert and oriented to person, place, and time. She has normal strength. No cranial nerve deficit ( no gross defecits noted) or sensory deficit. She exhibits normal muscle tone. She displays no  seizure activity. Coordination normal.  No pronator drift bilateral upper extrem, able to hold both legs off bed for 5 seconds, sensation intact in all extremities, no visual field cuts, no left or right sided neglect; oriented to month and year  Skin: Skin is warm and dry. No rash noted.  Psychiatric: She has a normal mood and affect.    ED Course  Procedures (including critical care time)  Labs Reviewed  COMPREHENSIVE METABOLIC PANEL - Abnormal; Notable for the following:    Glucose, Bld 104 (*)    GFR calc non Af Amer 81 (*)    All other components within normal limits  GLUCOSE, CAPILLARY - Abnormal; Notable for the following:    Glucose-Capillary 108 (*)    All other components within normal limits  POCT I-STAT, CHEM 8 - Abnormal; Notable for the following:    Calcium, Ion 1.06 (*)    All other components within normal limits  PROTIME-INR  APTT  CBC  DIFFERENTIAL   Ct Head Wo Contrast  10/11/2012   *RADIOLOGY REPORT*  Clinical Data: Headache  CT HEAD WITHOUT CONTRAST  Technique:  Contiguous axial images were obtained from the base of the skull through the vertex without contrast.  Comparison: None  Findings: Low density in the left putamen compatible with chronic ischemia.  Low densities in the sub insular white matter on the left also compatible with chronic ischemia. Mild chronic microvascular ischemia in the periventricular white matter.  Negative for acute infarct, hemorrhage, or mass lesion.  Ventricle size is normal.  2 cm retention cyst left maxillary sinus.  No focal skull abnormality.  IMPRESSION: Chronic ischemic changes left basal ganglia.  No acute abnormality.   Original Report Authenticated By: Janeece Riggers, M.D.   1. Headache     MDM  Patient has a normal neurologic exam. She's not complaining any difficulties with weakness or numbness. Patient's CT scan does not show any evidence of intracerebral hemorrhage or any other abnormality. The patient was offered pain  medications but just requested a Tylenol. Have a low suspicion for subarachnoid hemorrhage or meningitis. Feel that lumbar puncture is not necessary at this time.  Discussed the findings with the patient and her son. Warning signs and questions were discussed.  Celene Kras, MD 10/11/12 1400

## 2012-10-11 NOTE — ED Provider Notes (Addendum)
Jane Farley is a 79 y.o. female who presents to Urgent Care today for headache confusion and dizziness. The symptoms started at about 2:00 in the morning however the patient does not 100% sure. She notes a splitting headache associated with some dizziness. Her son notes that she seems to be a bit confused. She also notes fatigue. She denies any weakness numbness difficulty swallowing or speaking fevers or chills. She denies any nausea vomiting or diarrhea. She has not taken any medication for headache. She takes her medications at night. Her past medical history significant for a stroke several months ago   PMH reviewed. Recent stroke History  Substance Use Topics  . Smoking status: Former Smoker    Quit date: 03/04/2002  . Smokeless tobacco: Never Used  . Alcohol Use: No   ROS as above Medications reviewed. Current Facility-Administered Medications  Medication Dose Route Frequency Provider Last Rate Last Dose  . 0.9 %  sodium chloride infusion   Intravenous Once Rodolph Bong, MD       Current Outpatient Prescriptions  Medication Sig Dispense Refill  . calcium carbonate (OS-CAL) 600 MG TABS Take 600 mg by mouth. Once daily       . losartan (COZAAR) 100 MG tablet Take 100 mg by mouth daily.        . simvastatin (ZOCOR) 20 MG tablet Take 20 mg by mouth at bedtime.        . Zoledronic Acid (RECLAST IV) Inject into the vein. Once a year         Exam:  BP 187/107  Pulse 67  Temp(Src) 98.1 F (36.7 C) (Oral)  Resp 16  SpO2 99% Gen: Well NAD HEENT: EOMI,  MMM, PERRLA Lungs: CTABL Nl WOB Heart: RRR no MRG Abd: NABS, NT, ND Exts: Non edematous BL  LE, warm and well perfused.  Neuro: Alert. Not oriented to date. Not oriented to who the president is. Patient is oriented to location month year and day of the week.  Normal speech.  Cranial nerves II through XII are intact Sensation and strength are intact in bilateral upper and lower extremities Coordination is intact bilateral upper  extremities  No results found for this or any previous visit (from the past 24 hour(s)). No results found.  EKG: Normal sinus rhythm at 63 beats per minute. Flat lateral precordial T waves. No ST elevation or depression.  Assessment and Plan: 79 y.o. female with possible repeat stroke. Patient has a headache and some mild confusion without any other clear neurologic deficits. Her recent history significant for stroke.  I am concerned enough that I feel that she should be transferred to the emergency room for further evaluation and management.  Patient and son both expressed understanding and agreement.  We'll transfer via EMS for fear of fall with dizziness.  Started on IV at [facility]. Oxygen therapy and monitoring while pending transfer.       Rodolph Bong, MD 10/11/12 1018  Rodolph Bong, MD 10/11/12 1020

## 2012-10-11 NOTE — ED Notes (Signed)
Pt. Eating graham crackers and sprite

## 2012-10-11 NOTE — ED Notes (Signed)
Pt c/o headache onset this am around 0200/0300 BP is 182/107 upon arrival... Also states she feels very tired Hx of stroke back in April Denies: numbness/tingly, slurry speech, weakness Alert and talking to Dr. Denyse Amass w/no signs of acute distress.

## 2012-10-18 DIAGNOSIS — M810 Age-related osteoporosis without current pathological fracture: Secondary | ICD-10-CM | POA: Diagnosis not present

## 2012-10-18 DIAGNOSIS — D214 Benign neoplasm of connective and other soft tissue of abdomen: Secondary | ICD-10-CM | POA: Diagnosis not present

## 2012-10-18 DIAGNOSIS — I10 Essential (primary) hypertension: Secondary | ICD-10-CM | POA: Diagnosis not present

## 2012-10-18 DIAGNOSIS — R413 Other amnesia: Secondary | ICD-10-CM | POA: Diagnosis not present

## 2012-10-18 DIAGNOSIS — I6350 Cerebral infarction due to unspecified occlusion or stenosis of unspecified cerebral artery: Secondary | ICD-10-CM | POA: Diagnosis not present

## 2012-10-18 DIAGNOSIS — E785 Hyperlipidemia, unspecified: Secondary | ICD-10-CM | POA: Diagnosis not present

## 2012-10-18 DIAGNOSIS — Z23 Encounter for immunization: Secondary | ICD-10-CM | POA: Diagnosis not present

## 2012-10-19 ENCOUNTER — Emergency Department (HOSPITAL_COMMUNITY)
Admission: EM | Admit: 2012-10-19 | Discharge: 2012-10-19 | Disposition: A | Discharge status: Home / Self Care | Payer: Medicare Other | Attending: Emergency Medicine | Admitting: Emergency Medicine

## 2012-10-19 ENCOUNTER — Emergency Department (HOSPITAL_COMMUNITY): Payer: Medicare Other

## 2012-10-19 ENCOUNTER — Encounter (HOSPITAL_COMMUNITY): Payer: Self-pay | Admitting: Emergency Medicine

## 2012-10-19 DIAGNOSIS — Z7982 Long term (current) use of aspirin: Secondary | ICD-10-CM | POA: Insufficient documentation

## 2012-10-19 DIAGNOSIS — Z79899 Other long term (current) drug therapy: Secondary | ICD-10-CM | POA: Insufficient documentation

## 2012-10-19 DIAGNOSIS — E7800 Pure hypercholesterolemia, unspecified: Secondary | ICD-10-CM | POA: Diagnosis not present

## 2012-10-19 DIAGNOSIS — H5340 Unspecified visual field defects: Secondary | ICD-10-CM | POA: Diagnosis not present

## 2012-10-19 DIAGNOSIS — Z8719 Personal history of other diseases of the digestive system: Secondary | ICD-10-CM | POA: Insufficient documentation

## 2012-10-19 DIAGNOSIS — M810 Age-related osteoporosis without current pathological fracture: Secondary | ICD-10-CM | POA: Diagnosis not present

## 2012-10-19 DIAGNOSIS — Z87442 Personal history of urinary calculi: Secondary | ICD-10-CM | POA: Diagnosis not present

## 2012-10-19 DIAGNOSIS — H538 Other visual disturbances: Secondary | ICD-10-CM | POA: Diagnosis not present

## 2012-10-19 DIAGNOSIS — R51 Headache: Secondary | ICD-10-CM | POA: Diagnosis not present

## 2012-10-19 DIAGNOSIS — I10 Essential (primary) hypertension: Secondary | ICD-10-CM | POA: Diagnosis not present

## 2012-10-19 DIAGNOSIS — Z87891 Personal history of nicotine dependence: Secondary | ICD-10-CM | POA: Insufficient documentation

## 2012-10-19 DIAGNOSIS — G459 Transient cerebral ischemic attack, unspecified: Secondary | ICD-10-CM | POA: Insufficient documentation

## 2012-10-19 DIAGNOSIS — R6889 Other general symptoms and signs: Secondary | ICD-10-CM | POA: Diagnosis not present

## 2012-10-19 DIAGNOSIS — R42 Dizziness and giddiness: Secondary | ICD-10-CM | POA: Diagnosis not present

## 2012-10-19 LAB — CBC WITH DIFFERENTIAL/PLATELET
Hemoglobin: 12.7 g/dL (ref 12.0–15.0)
Lymphocytes Relative: 18 % (ref 12–46)
Lymphs Abs: 1.6 10*3/uL (ref 0.7–4.0)
MCV: 90.5 fL (ref 78.0–100.0)
Monocytes Relative: 6 % (ref 3–12)
Neutrophils Relative %: 74 % (ref 43–77)
Platelets: 211 10*3/uL (ref 150–400)
RBC: 4.02 MIL/uL (ref 3.87–5.11)
WBC: 8.6 10*3/uL (ref 4.0–10.5)

## 2012-10-19 LAB — URINALYSIS, ROUTINE W REFLEX MICROSCOPIC
Bilirubin Urine: NEGATIVE
Ketones, ur: NEGATIVE mg/dL
Nitrite: NEGATIVE
Protein, ur: NEGATIVE mg/dL
Urobilinogen, UA: 0.2 mg/dL (ref 0.0–1.0)
pH: 7 (ref 5.0–8.0)

## 2012-10-19 LAB — COMPREHENSIVE METABOLIC PANEL
ALT: 8 U/L (ref 0–35)
Alkaline Phosphatase: 46 U/L (ref 39–117)
BUN: 13 mg/dL (ref 6–23)
CO2: 24 mEq/L (ref 19–32)
GFR calc Af Amer: >90 mL/min (ref >90)
GFR calc non Af Amer: 81 mL/min — ABNORMAL LOW (ref >90)
Glucose, Bld: 105 mg/dL — ABNORMAL HIGH (ref 70–99)
Potassium: 3.9 mEq/L (ref 3.5–5.1)
Sodium: 137 mEq/L (ref 135–145)
Total Bilirubin: 0.5 mg/dL (ref 0.3–1.2)

## 2012-10-19 LAB — POCT I-STAT TROPONIN I: Troponin i, poc: 0.01 ng/mL (ref 0.00–0.08)

## 2012-10-19 LAB — URINE MICROSCOPIC-ADD ON

## 2012-10-19 MED ORDER — CLOPIDOGREL BISULFATE 75 MG PO TABS: 75.0000 mg | ORAL_TABLET | Freq: Every day | ORAL | Status: DC

## 2012-10-19 NOTE — ED Provider Notes (Signed)
CSN: [id number]     Arrival date & time 10/19/12  1114 History   First MD Initiated Contact with Patient 10/19/12 1157     Chief Complaint  Patient presents with  . Headache  . Visual Field Change    HPI   Patient has history of CVA in April of this year. She had some loss of vision. She waited 3 days. She saw her physician. She states he told her that she had a stroke.. was placed on aspirin. She takes a baby aspirin every night. She was watching television last night when she states her vision on one side became poor. She cannot recall which side. She still wears a grid on her right lens or glasses because of difficulty with vision related to her previous stroke. She did not have a headache. She states she "just didn't feel good". She denies headache nausea chest pain palpitations numbness weakness tingling disuse of extremities or other symptoms now. Past Medical History  Diagnosis Date  . Hemorrhoids   . Nephrolithiasis   . Hypercholesterolemia   . Hypertension   . Osteoporosis    Past Surgical History  Procedure Laterality Date  . Breast lumpectomy  02/23/1965    left  . Exploratory laparotomy with abdominal mass excision  12/08/2010   Family History  Problem Relation Age of Onset  . Diabetes Father   . Diabetes Brother    History  Substance Use Topics  . Smoking status: Former Smoker    Quit date: 03/04/2002  . Smokeless tobacco: Never Used  . Alcohol Use: No   OB History   Grav Para Term Preterm Abortions TAB SAB Ect Mult Living                 Review of Systems  Constitutional: Negative for fever, chills, diaphoresis, appetite change and fatigue.  HENT: Negative for sore throat, mouth sores and trouble swallowing.   Eyes: Positive for visual disturbance. Negative for photophobia.  Respiratory: Negative for cough, chest tightness, shortness of breath and wheezing.   Cardiovascular: Negative for chest pain.  Gastrointestinal: Negative for nausea, vomiting,  abdominal pain, diarrhea and abdominal distention.  Endocrine: Negative for polydipsia, polyphagia and polyuria.  Genitourinary: Negative for dysuria, frequency and hematuria.  Musculoskeletal: Negative for gait problem.  Skin: Negative for color change, pallor and rash.  Neurological: Positive for headaches. Negative for dizziness, syncope and light-headedness.  Hematological: Does not bruise/bleed easily.  Psychiatric/Behavioral: Negative for behavioral problems and confusion.    Allergies  Review of patient's allergies indicates no known allergies.  Home Medications   Current Outpatient Rx  Name  Route  Sig  Dispense  Refill  . aspirin EC 81 MG tablet   Oral   Take 81 mg by mouth daily.         . calcium carbonate (OS-CAL) 600 MG TABS   Oral   Take 600 mg by mouth daily.          Marland Kitchen losartan (COZAAR) 100 MG tablet   Oral   Take 100 mg by mouth daily.           . simvastatin (ZOCOR) 20 MG tablet   Oral   Take 20 mg by mouth at bedtime.           . Zoledronic Acid (RECLAST IV)   Intravenous   Inject into the vein. Once a year          . clopidogrel (PLAVIX) 75 MG tablet   Oral  Take 1 tablet (75 mg total) by mouth daily.   30 tablet   0    BP 170/89  Pulse 61  Temp(Src) 99.1 F (37.3 C) (Oral)  Resp 23  Wt 125 lb (56.7 kg)  BMI 22.86 kg/m2  SpO2 96% Physical Exam  Constitutional: She is oriented to person, place, and time. She appears well-developed and well-nourished. No distress.  HENT:  Head: Normocephalic.  Eyes: Conjunctivae are normal. Pupils are equal, round, and reactive to light. No scleral icterus.  Neck: Normal range of motion. Neck supple. No thyromegaly present.  Cardiovascular: Normal rate and regular rhythm.  Exam reveals no gallop and no friction rub.   No murmur heard. Sinus rhythm on the monitor  Pulmonary/Chest: Effort normal and breath sounds normal. No respiratory distress. She has no wheezes. She has no rales.  Abdominal:  Soft. Bowel sounds are normal. She exhibits no distension. There is no tenderness. There is no rebound.  Musculoskeletal: Normal range of motion.  Neurological: She is alert and oriented to person, place, and time.  Her cranial nerves are intact and symmetric. She has no visual field deficit to confrontation. She has no upper or lower facial droop. She has normal sensation to the face. Normal palate, normal tongue protrusion. Normal shoulder shrug. No pronator drift. Normal use of extremities. She has normal gait observed she walked to the room  Skin: Skin is warm and dry. No rash noted.  Psychiatric: She has a normal mood and affect. Her behavior is normal.    ED Course  Procedures (including critical care time)  EKG: Indication possible stroke. Interpretation sinus rhythm. Normal intervals. No acute ischemic changes. No ectopy.  Labs Review Labs Reviewed  COMPREHENSIVE METABOLIC PANEL - Abnormal; Notable for the following:    Glucose, Bld 105 (*)    GFR calc non Af Amer 81 (*)    All other components within normal limits  URINALYSIS, ROUTINE W REFLEX MICROSCOPIC - Abnormal; Notable for the following:    Hgb urine dipstick LARGE (*)    All other components within normal limits  URINE CULTURE  CBC WITH DIFFERENTIAL  URINE MICROSCOPIC-ADD ON  POCT I-STAT TROPONIN I   Imaging Review Ct Head Wo Contrast  10/19/2012   *RADIOLOGY REPORT*  Clinical Data: Vertigo, visual changes and headache.  History of prior cerebral infarction.  CT HEAD WITHOUT CONTRAST  Technique:  Contiguous axial images were obtained from the base of the skull through the vertex without contrast.  Comparison: 10/11/2012  Findings: Stable small vessel disease and old lacunar infarcts on the left.  The brain demonstrates no evidence of hemorrhage, infarction, edema, mass effect, extra-axial fluid collection, hydrocephalus or mass lesion.  The skull is unremarkable.  IMPRESSION: Stable head CT demonstrating chronic small  vessel disease and old lacunar infarcts.  No acute abnormalities are identified.   Original Report Authenticated By: Irish Lack, M.D.    MDM   1. TIA (transient ischemic attack)    No sign of acute stroke clinically now this may have been a TIA. EKG CT head labs pending urinalysis pending will reevaluate.  On reevaluation she states she feels "much better". No neurological symptoms at this time. Has small vessel disease on head CT but no acute changes.  She takes a full aspirin daily. Her discontinue this in favor of Plavix. Asked her to call her physician tomorrow regarding this change. Recheck with any symptoms do not resolve within one hours time.    Claudean Kinds, MD 10/19/12  1535 

## 2012-10-19 NOTE — ED Notes (Signed)
Pt arrives via EMS from home with sx overnight similar to previous stroke. States around lastnight had leftsided headache and partial vision loss. Pt then went to bed. Woke with headaceh resolved but just "didnt feel right." Called EMs. 12 lead unremarkable. Initial bp on scene 210/120, 20g rhand placed, cbg 155. Denies pain, alert, oriented x4, NAD at present.

## 2012-10-19 NOTE — ED Notes (Signed)
Pt aware we need urine sample.  

## 2012-10-19 NOTE — ED Notes (Signed)
Pt stated she was not able to provide urine specimen at this time. Urine cup placed at bedside.

## 2012-10-20 LAB — URINE CULTURE: Culture: NO GROWTH

## 2012-11-21 ENCOUNTER — Other Ambulatory Visit: Payer: Self-pay | Admitting: Dermatology

## 2012-11-21 DIAGNOSIS — L259 Unspecified contact dermatitis, unspecified cause: Secondary | ICD-10-CM | POA: Diagnosis not present

## 2012-11-21 DIAGNOSIS — C44319 Basal cell carcinoma of skin of other parts of face: Secondary | ICD-10-CM | POA: Diagnosis not present

## 2012-11-21 DIAGNOSIS — Z85828 Personal history of other malignant neoplasm of skin: Secondary | ICD-10-CM | POA: Diagnosis not present

## 2012-11-21 DIAGNOSIS — C4401 Basal cell carcinoma of skin of lip: Secondary | ICD-10-CM | POA: Diagnosis not present

## 2012-11-22 ENCOUNTER — Encounter (HOSPITAL_COMMUNITY): Payer: Self-pay | Admitting: Cardiology

## 2012-11-22 ENCOUNTER — Inpatient Hospital Stay (HOSPITAL_COMMUNITY)
Admission: EM | Admit: 2012-11-22 | Discharge: 2012-11-25 | DRG: 065 | Disposition: A | Discharge status: Skilled Nursing Facility | Payer: Medicare Other | Attending: Internal Medicine | Admitting: Internal Medicine

## 2012-11-22 ENCOUNTER — Emergency Department (HOSPITAL_COMMUNITY): Payer: Medicare Other

## 2012-11-22 DIAGNOSIS — Z833 Family history of diabetes mellitus: Secondary | ICD-10-CM | POA: Diagnosis not present

## 2012-11-22 DIAGNOSIS — Q211 Atrial septal defect: Secondary | ICD-10-CM | POA: Diagnosis not present

## 2012-11-22 DIAGNOSIS — R4789 Other speech disturbances: Secondary | ICD-10-CM | POA: Diagnosis not present

## 2012-11-22 DIAGNOSIS — Z7902 Long term (current) use of antithrombotics/antiplatelets: Secondary | ICD-10-CM | POA: Diagnosis not present

## 2012-11-22 DIAGNOSIS — Z87891 Personal history of nicotine dependence: Secondary | ICD-10-CM | POA: Diagnosis not present

## 2012-11-22 DIAGNOSIS — I639 Cerebral infarction, unspecified: Secondary | ICD-10-CM | POA: Diagnosis present

## 2012-11-22 DIAGNOSIS — E785 Hyperlipidemia, unspecified: Secondary | ICD-10-CM | POA: Diagnosis present

## 2012-11-22 DIAGNOSIS — M810 Age-related osteoporosis without current pathological fracture: Secondary | ICD-10-CM | POA: Diagnosis present

## 2012-11-22 DIAGNOSIS — Z8673 Personal history of transient ischemic attack (TIA), and cerebral infarction without residual deficits: Secondary | ICD-10-CM | POA: Diagnosis not present

## 2012-11-22 DIAGNOSIS — E7800 Pure hypercholesterolemia, unspecified: Secondary | ICD-10-CM | POA: Diagnosis present

## 2012-11-22 DIAGNOSIS — R29898 Other symptoms and signs involving the musculoskeletal system: Secondary | ICD-10-CM | POA: Diagnosis present

## 2012-11-22 DIAGNOSIS — R471 Dysarthria and anarthria: Secondary | ICD-10-CM | POA: Diagnosis present

## 2012-11-22 DIAGNOSIS — R4701 Aphasia: Secondary | ICD-10-CM | POA: Diagnosis present

## 2012-11-22 DIAGNOSIS — R29810 Facial weakness: Secondary | ICD-10-CM | POA: Diagnosis present

## 2012-11-22 DIAGNOSIS — Z8249 Family history of ischemic heart disease and other diseases of the circulatory system: Secondary | ICD-10-CM | POA: Diagnosis not present

## 2012-11-22 DIAGNOSIS — I6789 Other cerebrovascular disease: Secondary | ICD-10-CM | POA: Diagnosis not present

## 2012-11-22 DIAGNOSIS — I10 Essential (primary) hypertension: Secondary | ICD-10-CM | POA: Diagnosis not present

## 2012-11-22 DIAGNOSIS — I369 Nonrheumatic tricuspid valve disorder, unspecified: Secondary | ICD-10-CM | POA: Diagnosis not present

## 2012-11-22 DIAGNOSIS — I6340 Cerebral infarction due to embolism of unspecified cerebral artery: Secondary | ICD-10-CM | POA: Diagnosis not present

## 2012-11-22 DIAGNOSIS — I119 Hypertensive heart disease without heart failure: Secondary | ICD-10-CM | POA: Diagnosis not present

## 2012-11-22 DIAGNOSIS — I69920 Aphasia following unspecified cerebrovascular disease: Secondary | ICD-10-CM | POA: Diagnosis not present

## 2012-11-22 DIAGNOSIS — G8190 Hemiplegia, unspecified affecting unspecified side: Secondary | ICD-10-CM | POA: Diagnosis present

## 2012-11-22 DIAGNOSIS — G459 Transient cerebral ischemic attack, unspecified: Secondary | ICD-10-CM | POA: Diagnosis not present

## 2012-11-22 DIAGNOSIS — I6350 Cerebral infarction due to unspecified occlusion or stenosis of unspecified cerebral artery: Principal | ICD-10-CM

## 2012-11-22 DIAGNOSIS — Q2111 Secundum atrial septal defect: Secondary | ICD-10-CM

## 2012-11-22 DIAGNOSIS — I679 Cerebrovascular disease, unspecified: Secondary | ICD-10-CM | POA: Diagnosis not present

## 2012-11-22 DIAGNOSIS — Z79899 Other long term (current) drug therapy: Secondary | ICD-10-CM | POA: Diagnosis not present

## 2012-11-22 DIAGNOSIS — Z86718 Personal history of other venous thrombosis and embolism: Secondary | ICD-10-CM | POA: Diagnosis not present

## 2012-11-22 HISTORY — DX: Cerebral infarction, unspecified: I63.9

## 2012-11-22 LAB — CBC WITH DIFFERENTIAL/PLATELET
Lymphocytes Relative: 19 % (ref 12–46)
Lymphs Abs: 1.2 10*3/uL (ref 0.7–4.0)
Neutro Abs: 4.6 10*3/uL (ref 1.7–7.7)
Platelets: 253 10*3/uL (ref 150–400)
RBC: 4.02 MIL/uL (ref 3.87–5.11)
RDW: 13.1 % (ref 11.5–15.5)
WBC: 6.2 10*3/uL (ref 4.0–10.5)

## 2012-11-22 LAB — COMPREHENSIVE METABOLIC PANEL
Albumin: 3.7 g/dL (ref 3.5–5.2)
BUN: 10 mg/dL (ref 6–23)
Calcium: 8.9 mg/dL (ref 8.4–10.5)
Creatinine, Ser: 0.65 mg/dL (ref 0.50–1.10)
GFR calc Af Amer: >90 mL/min (ref >90)
Glucose, Bld: 107 mg/dL — ABNORMAL HIGH (ref 70–99)
Total Protein: 6.8 g/dL (ref 6.0–8.3)

## 2012-11-22 LAB — URINE MICROSCOPIC-ADD ON

## 2012-11-22 LAB — PROTIME-INR: Prothrombin Time: 12.4 seconds (ref 11.6–15.2)

## 2012-11-22 LAB — RAPID URINE DRUG SCREEN, HOSP PERFORMED
Amphetamines: NOT DETECTED
Opiates: NOT DETECTED
Tetrahydrocannabinol: NOT DETECTED

## 2012-11-22 LAB — URINALYSIS, ROUTINE W REFLEX MICROSCOPIC
Glucose, UA: NEGATIVE mg/dL
Leukocytes, UA: NEGATIVE
Nitrite: NEGATIVE
pH: 6.5 (ref 5.0–8.0)

## 2012-11-22 LAB — POCT I-STAT TROPONIN I: Troponin i, poc: 0.02 ng/mL (ref 0.00–0.08)

## 2012-11-22 LAB — ETHANOL: Alcohol, Ethyl (B): <11 mg/dL (ref 0–11)

## 2012-11-22 MED ORDER — ACETAMINOPHEN 325 MG PO TABS: 650.0000 mg | ORAL_TABLET | Freq: Four times a day (QID) | ORAL | Status: DC | PRN

## 2012-11-22 MED ORDER — ASPIRIN 300 MG RE SUPP
300.0000 mg | Freq: Once | RECTAL | Status: AC
  Administered 2012-11-22: 300 mg via RECTAL
  Filled 2012-11-22: qty 1

## 2012-11-22 MED ORDER — SODIUM CHLORIDE 0.9 % IV SOLN
250.0000 mL | INTRAVENOUS | Status: DC | PRN
  Administered 2012-11-23: 10 mL/h via INTRAVENOUS

## 2012-11-22 MED ORDER — ENOXAPARIN SODIUM 40 MG/0.4ML ~~LOC~~ SOLN
40.0000 mg | SUBCUTANEOUS | Status: DC
  Administered 2012-11-22 – 2012-11-24 (×3): 40 mg via SUBCUTANEOUS
  Filled 2012-11-22 (×4): qty 0.4

## 2012-11-22 MED ORDER — LOSARTAN POTASSIUM 50 MG PO TABS
100.0000 mg | ORAL_TABLET | Freq: Every day | ORAL | Status: DC
  Administered 2012-11-23 – 2012-11-25 (×3): 100 mg via ORAL
  Filled 2012-11-22 (×4): qty 2

## 2012-11-22 MED ORDER — CLOPIDOGREL BISULFATE 75 MG PO TABS
75.0000 mg | ORAL_TABLET | Freq: Every day | ORAL | Status: DC
  Administered 2012-11-23 – 2012-11-25 (×3): 75 mg via ORAL
  Filled 2012-11-22 (×3): qty 1

## 2012-11-22 MED ORDER — CALCIUM CARBONATE 1250 (500 CA) MG PO TABS
1.0000 | ORAL_TABLET | Freq: Every day | ORAL | Status: DC
  Administered 2012-11-23 – 2012-11-25 (×3): 500 mg via ORAL
  Filled 2012-11-22 (×4): qty 1

## 2012-11-22 MED ORDER — LORAZEPAM 2 MG/ML IJ SOLN
1.0000 mg | Freq: Once | INTRAMUSCULAR | Status: AC
  Administered 2012-11-22: 1 mg via INTRAVENOUS
  Filled 2012-11-22: qty 1

## 2012-11-22 MED ORDER — ASPIRIN EC 81 MG PO TBEC
81.0000 mg | DELAYED_RELEASE_TABLET | Freq: Every day | ORAL | Status: DC
  Administered 2012-11-23 – 2012-11-25 (×3): 81 mg via ORAL
  Filled 2012-11-22 (×4): qty 1

## 2012-11-22 MED ORDER — ACETAMINOPHEN 650 MG RE SUPP: 650.0000 mg | Freq: Four times a day (QID) | RECTAL | Status: DC | PRN

## 2012-11-22 MED ORDER — SIMVASTATIN 20 MG PO TABS
20.0000 mg | ORAL_TABLET | Freq: Every day | ORAL | Status: DC
  Administered 2012-11-23 – 2012-11-24 (×2): 20 mg via ORAL
  Filled 2012-11-22 (×4): qty 1

## 2012-11-22 MED ORDER — SODIUM CHLORIDE 0.9 % IJ SOLN
3.0000 mL | Freq: Two times a day (BID) | INTRAMUSCULAR | Status: DC
  Administered 2012-11-22 – 2012-11-24 (×4): 3 mL via INTRAVENOUS

## 2012-11-22 MED ORDER — SODIUM CHLORIDE 0.9 % IJ SOLN: 3.0000 mL | INTRAMUSCULAR | Status: DC | PRN

## 2012-11-22 MED ORDER — CALCIUM CARBONATE 600 MG PO TABS
600.0000 mg | ORAL_TABLET | Freq: Every day | ORAL | Status: DC
  Filled 2012-11-22: qty 1

## 2012-11-22 NOTE — ED Provider Notes (Signed)
CSN: [id number]     Arrival date & time 11/22/12  0945 History   First MD Initiated Contact with Patient 11/22/12 [phone]     Chief Complaint  Patient presents with  . Aphasia   (Consider location/radiation/quality/duration/timing/severity/associated sxs/prior Treatment) HPI Comments: Patient is a 79 year old female past medical history significant for CVA, TIA, hypertension, hypercholesterolemia presented to the emergency department for aphasia. She was last seen normal around 9 PM last evening, patient states she felt nauseous and developed vomiting last evening. She states that she woke up she was having difficulty with word finding. Patient's son states that this is exact same presentation that occurred last month when patient was diagnosed with a TIA. Patient had a full stroke evaluation done in April by Dr. Wylene Simmer including Echo, and Carotid studies.    The history is provided by the patient, the EMS personnel and a relative.    Past Medical History  Diagnosis Date  . Hemorrhoids   . Nephrolithiasis   . Hypercholesterolemia   . Hypertension   . Osteoporosis   . Stroke     TIA   Past Surgical History  Procedure Laterality Date  . Breast lumpectomy  02/23/1965    left  . Exploratory laparotomy with abdominal mass excision  12/08/2010   Family History  Problem Relation Age of Onset  . Diabetes Father   . Diabetes Brother    History  Substance Use Topics  . Smoking status: Former Smoker    Quit date: 03/04/2002  . Smokeless tobacco: Never Used  . Alcohol Use: No   OB History   Grav Para Term Preterm Abortions TAB SAB Ect Mult Living                 Review of Systems  Gastrointestinal: Positive for nausea and vomiting.  Neurological: Positive for speech difficulty and weakness.  All other systems reviewed and are negative.    Allergies  Review of patient's allergies indicates no known allergies.  Home Medications   Current Outpatient Rx  Name  Route  Sig   Dispense  Refill  . calcium carbonate (OS-CAL) 600 MG TABS   Oral   Take 600 mg by mouth daily.          . clopidogrel (PLAVIX) 75 MG tablet   Oral   Take 1 tablet (75 mg total) by mouth daily.   30 tablet   0   . losartan (COZAAR) 100 MG tablet   Oral   Take 100 mg by mouth daily.           Marland Kitchen OVER THE COUNTER MEDICATION   Oral   Take 15 mLs by mouth daily as needed (sleep).         . simvastatin (ZOCOR) 20 MG tablet   Oral   Take 20 mg by mouth at bedtime.           . Zoledronic Acid (RECLAST IV)   Intravenous   Inject into the vein. Once a year           BP 151/81  Pulse 65  Temp(Src) 98.1 F (36.7 C) (Oral)  Resp 19  SpO2 99% Physical Exam  Constitutional: She is oriented to person, place, and time. She appears well-developed and well-nourished.  Tearful   HENT:  Head: Normocephalic and atraumatic.  Right Ear: External ear normal.  Left Ear: External ear normal.  Nose: Nose normal.  Mouth/Throat: Oropharynx is clear and moist. No oropharyngeal exudate.  Eyes: Conjunctivae and EOM  are normal. Pupils are equal, round, and reactive to light.  Neck: Normal range of motion. Neck supple.  Cardiovascular: Normal rate, regular rhythm, normal heart sounds and intact distal pulses.   Pulmonary/Chest: Effort normal and breath sounds normal. No respiratory distress.  Abdominal: Soft. She exhibits no distension. There is no tenderness. There is no rebound and no guarding.  Musculoskeletal: She exhibits no edema and no tenderness.  Lymphadenopathy:    She has no cervical adenopathy.  Neurological: She is alert and oriented to person, place, and time. No cranial nerve deficit or sensory deficit.  Aphasic.  Right UE 4+/5. All other extremities 5/5 No pronator drift.  Bilateral heel-knee-shin intact.  Bilateral finger-nose-finger intact.   Skin: Skin is warm and dry. She is not diaphoretic.  Psychiatric: Her mood appears anxious. Her speech is slurred.    ED  Course  Procedures (including critical care time)  Medications  LORazepam (ATIVAN) injection 1 mg (1 mg Intravenous Given 11/22/12 1031)  aspirin suppository 300 mg (300 mg Rectal Given 11/22/12 1316)    Date: 11/22/2012  Rate: 65  Rhythm: normal sinus rhythm  QRS Axis: normal  Intervals: PR shortened  ST/T Wave abnormalities: normal  Conduction Disutrbances:none  Narrative Interpretation:   Old EKG Reviewed: changes noted  Labs Review Labs Reviewed  CBC WITH DIFFERENTIAL - Abnormal; Notable for the following:    HCT 35.3 (*)    All other components within normal limits  COMPREHENSIVE METABOLIC PANEL - Abnormal; Notable for the following:    Glucose, Bld 107 (*)    GFR calc non Af Amer 83 (*)    All other components within normal limits  URINALYSIS, ROUTINE W REFLEX MICROSCOPIC - Abnormal; Notable for the following:    Hgb urine dipstick MODERATE (*)    All other components within normal limits  GLUCOSE, CAPILLARY - Abnormal; Notable for the following:    Glucose-Capillary 115 (*)    All other components within normal limits  URINE CULTURE  URINE MICROSCOPIC-ADD ON  ETHANOL  PROTIME-INR  APTT  URINE RAPID DRUG SCREEN (HOSP PERFORMED)  POCT I-STAT TROPONIN I   Imaging Review Mr Shirlee Latch Wo Contrast  11/22/2012   CLINICAL DATA:  Nausea. Speech difficulty.  Evaluate for stroke.  EXAM: MRI HEAD WITHOUT CONTRAST  MRA HEAD WITHOUT CONTRAST  TECHNIQUE: Multiplanar, multiecho pulse sequences of the brain and surrounding structures were obtained without intravenous contrast. Angiographic images of the head were obtained using MRA technique without contrast.  COMPARISON:  CT head 10/19/2012 and 10/11/2012.  FINDINGS: MRI HEAD FINDINGS  Multiple foci of acute infarction are present in the posterior circulation. The largest area of infarction affects the left much greater than right mid to upper pons. Smaller multifocal areas of acute infarction are seen in the right and left cerebellar  hemispheres, and left occipital cortex and white matter. There is no associated acute or chronic hemorrhage. Flow voids are maintained.  Moderate atrophy is present. Chronic microvascular ischemic change affects the periventricular and subcortical white matter. Prominent perivascular spaces suggests longstanding hypertension. Remote left basal ganglia and left periventricular white matter lacunar infarcts. No chronic large vessel infarct.  Scalp and extracranial soft tissues unremarkable. Negative osseous structures. Bilateral cataract extraction. Chronic sinus disease with moderate retention cyst left maxillary region. No acute mastoid fluid.  Compared with prior CT, these findings are not visible.  MRA HEAD FINDINGS  The internal carotid arteries are patent throughout their upper cervical, skull base, and cavernous segments. There is mild non-stenotic  irregularity of the supraclinoid ICA vessels. ICA termini are widely patent. Mild irregularity both proximal anterior cerebral arteries. Mild irregularity of both proximal middle cerebral arteries. Fetal origin right PCA with mild irregularity of both proximal posterior cerebral vessels. No intracranial aneurysm.  There is moderate non stenotic irregularity of the proximal basilar artery (arrows) with the left vertebral as the dominant contributor. There is severe disease of the much smaller distal right vertebral with multiple tandem high-grade stenoses in its distal V4 segment. No stenosis or irregularity of the distal left vertebral. No flow-limiting stenosis of the basilar.  IMPRESSION: MRI HEAD IMPRESSION  Multifocal areas of acute infarction within the posterior circulation. The area of most significant involvement includes the left greater than right mid to upper pons. Atrophy and chronic microvascular ischemic change. Findings discussed with ordering provider.  MRA HEAD IMPRESSION  Mild non stenotic irregularity of the proximal basilar artery and both  supraclinoid internal carotid arteries. No flow-limiting basilar stenosis. Severe irregularity distal right vertebral.   Electronically Signed   By: Davonna Belling M.D.   On: 11/22/2012 13:43   Mr Brain Wo Contrast  11/22/2012   CLINICAL DATA:  Nausea. Speech difficulty.  Evaluate for stroke.  EXAM: MRI HEAD WITHOUT CONTRAST  MRA HEAD WITHOUT CONTRAST  TECHNIQUE: Multiplanar, multiecho pulse sequences of the brain and surrounding structures were obtained without intravenous contrast. Angiographic images of the head were obtained using MRA technique without contrast.  COMPARISON:  CT head 10/19/2012 and 10/11/2012.  FINDINGS: MRI HEAD FINDINGS  Multiple foci of acute infarction are present in the posterior circulation. The largest area of infarction affects the left much greater than right mid to upper pons. Smaller multifocal areas of acute infarction are seen in the right and left cerebellar hemispheres, and left occipital cortex and white matter. There is no associated acute or chronic hemorrhage. Flow voids are maintained.  Moderate atrophy is present. Chronic microvascular ischemic change affects the periventricular and subcortical white matter. Prominent perivascular spaces suggests longstanding hypertension. Remote left basal ganglia and left periventricular white matter lacunar infarcts. No chronic large vessel infarct.  Scalp and extracranial soft tissues unremarkable. Negative osseous structures. Bilateral cataract extraction. Chronic sinus disease with moderate retention cyst left maxillary region. No acute mastoid fluid.  Compared with prior CT, these findings are not visible.  MRA HEAD FINDINGS  The internal carotid arteries are patent throughout their upper cervical, skull base, and cavernous segments. There is mild non-stenotic irregularity of the supraclinoid ICA vessels. ICA termini are widely patent. Mild irregularity both proximal anterior cerebral arteries. Mild irregularity of both proximal  middle cerebral arteries. Fetal origin right PCA with mild irregularity of both proximal posterior cerebral vessels. No intracranial aneurysm.  There is moderate non stenotic irregularity of the proximal basilar artery (arrows) with the left vertebral as the dominant contributor. There is severe disease of the much smaller distal right vertebral with multiple tandem high-grade stenoses in its distal V4 segment. No stenosis or irregularity of the distal left vertebral. No flow-limiting stenosis of the basilar.  IMPRESSION: MRI HEAD IMPRESSION  Multifocal areas of acute infarction within the posterior circulation. The area of most significant involvement includes the left greater than right mid to upper pons. Atrophy and chronic microvascular ischemic change. Findings discussed with ordering provider.  MRA HEAD IMPRESSION  Mild non stenotic irregularity of the proximal basilar artery and both supraclinoid internal carotid arteries. No flow-limiting basilar stenosis. Severe irregularity distal right vertebral.   Electronically Signed   By: Jonny Ruiz  Curnes M.D.   On: 11/22/2012 13:43   CRITICAL CARE Performed by: Francee Piccolo L   Total critical care time: 30 minutes  Critical care time was exclusive of separately billable procedures and treating other patients.  Critical care was necessary to treat or prevent imminent or life-threatening deterioration.  Critical care was time spent personally by me on the following activities: development of treatment plan with patient and/or surrogate as well as nursing, discussions with consultants, evaluation of patient's response to treatment, examination of patient, obtaining history from patient or surrogate, ordering and performing treatments and interventions, ordering and review of laboratory studies, ordering and review of radiographic studies, pulse oximetry and re-evaluation of patient's condition.   MDM   1. Stroke    Patient presenting with aphasia  and right extremity weakness. No other neurofocal deficits on examination from patient's baseline. Patient has history of CVA and TIA within the past year. MR and MRA obtained and revealed large posterior and brainstem infarct.  I have reviewed nursing notes, vital signs, and all appropriate lab and imaging results for this patient. Patient will be admitted to Dr. Deneen Harts service with consultation obtained from Dr. Thad Ranger, from neurology. The patient appears reasonably stabilized for admission considering the current resources, flow, and capabilities available in the ED at this time, and I doubt any other [facility] requiring further screening and/or treatment in the ED prior to admission.Patient d/w with Dr. Patria Mane, agrees with plan.        Jeannetta Ellis, PA-C 11/22/12 1517

## 2012-11-22 NOTE — ED Notes (Signed)
Pt returned from MRI °

## 2012-11-22 NOTE — ED Notes (Signed)
Patient transported to MRI 

## 2012-11-22 NOTE — ED Provider Notes (Signed)
Medical screening examination/treatment/procedure(s) were conducted as a shared visit with non-physician practitioner(s) and myself.  I personally evaluated the patient during the encounter  Patient with acute stroke on MRI.  Patient be admitted to the hospital.  Neurology consultation.  Rectal aspirin given.  Failed her swallow study  Lyanne Co, MD 11/22/12 [phone]

## 2012-11-22 NOTE — Progress Notes (Signed)
Pt received from ED   with dx brain stem CVA . Transferred to bed and made comfortable . Cardiac monitor applied and CMT notified  Vs done with initial assessment  Rn will monitor

## 2012-11-22 NOTE — H&P (Addendum)
Jane Farley is an 79 y.o. female.   PCP:   Gaspar Garbe, MD   Chief Complaint:  Subacute Posterior CVA, Dysarthria.  HPI: 89 F with HTN, recent former smoker, Hyperlipemia who had a CVA 05/2012 in L Occiput/Thalmus c L field of vision Loss - needing Prisms.  She was worked up with Carotids and ECHO in April.  EF 60-65%.  LVH.  No Wall motion abnormalities.  No clot.  MRI c MRA carotids and circle of Willis 06/08/12:  Multifocal regions of Iscemic changes in L Cerebellar hemisphere and L Occipital Lobe.  Small additional lesions are present within the medial L Temporal Lobe and Upper Thalmus.  Moderately advanced chronic microvascular disease.  Maxillary Retention cyst. Negative MRA of Carotids.  Developmental varients of the circle of Willis without stenosis.  Carotid Dopplers = Multifocal plaque L>R carotids with < 60% stenosis B.  ASA 325 started.  She has done OK since April.  Seen by Dr Wylene Simmer 06/2012 and LDL72 c good BP control.  HA W/up c UC and ED in August.  Memory loss OV end of August with Dr T = B12, RPR, Thyroid and CMET all Normal.  She was switched to Plavix at end of August according to notes.  She was normal @ 9pm last night.  She felt nauseous and developed vomiting last evening. She states that she woke up and was having difficulty with word finding.  She presented to ED and MRI confirmed a acute posterior CVA.  Called for inpt admission. Labs reviewed and fine.  Seen in ED at 5:30 pm and she looks weak, frail, exhausted.  No pains. Having difficulty voicing her thoughts.  Received ASA 300 PR and Ativan in ED.  Past Medical History:  Past Medical History  Diagnosis Date  . Hemorrhoids   . Nephrolithiasis   . Hypercholesterolemia   . Hypertension   . Osteoporosis   . Stroke     TIA   Hyperlipidemia Hypertension 2012 Osteoporosis, Reclast at hospital for treatment Skin cancer face GI Stromal Tumor, 2012, s/p resection CVA, L occiput/thalamic, presumed, 05/2012 (L  field of vision loss), using prismatic glassses per [facility]   Past Surgical History  Procedure Laterality Date  . Breast lumpectomy  02/23/1965    left  . Exploratory laparotomy with abdominal mass excision  12/08/2010    Breast cyst Cataract B 2007 Dr Charlotte Sanes GI Stromal tumor, 2012, [place]   Allergies:  No Known Allergies   Medications: Prior to Admission medications   Medication Sig Start Date End Date Taking? Authorizing Provider  calcium carbonate (OS-CAL) 600 MG TABS Take 600 mg by mouth daily.    Yes Historical Provider, MD  clopidogrel (PLAVIX) 75 MG tablet Take 1 tablet (75 mg total) by mouth daily. 10/19/12  Yes Roney Marion, MD  losartan (COZAAR) 100 MG tablet Take 100 mg by mouth daily.     Yes Historical Provider, MD  OVER THE COUNTER MEDICATION Take 15 mLs by mouth daily as needed (sleep).   Yes Historical Provider, MD  simvastatin (ZOCOR) 20 MG tablet Take 20 mg by mouth at bedtime.     Yes Historical Provider, MD  Zoledronic Acid (RECLAST IV) Inject into the vein. Once a year     Historical Provider, MD   1)  Aspirin 325 Mg Tabs (Aspirin) .Marland Kitchen.. 1 tablet once daily 2)  Caltrate 600 1500 Mg Tabs (Calcium carbonate) .Marland Kitchen.. 1 tab po bid 3)  Reclast 5 Mg/149ml Soln (Zoledronic acid) .... Iv  q year @hosp . 4)  Simvastatin 40 Mg Tabs (Simvastatin) .Marland Kitchen.. 1 tab po qd 5)  Losartan Potassium 100 Mg Tabs (Losartan potassium) .Marland Kitchen.. 1 tablet once daily     (Not in a hospital admission)   Social History:  reports that she quit smoking about 10 years ago. She has never used smokeless tobacco. She reports that she does not drink alcohol or use illicit drugs. Patient is widowed w/1 son.   She Retired from [employer]. Smoker, 20+ years.   Does not drink alcohol.  Family History: Family History  Problem Relation Age of Onset  . Diabetes Father   . Diabetes Brother     Father died at age 45, MI DM2 Mother died at age 30, CVA 75 Brothers, 1 alve DM, 1 deceased at 15 MI, 1  deceased at 26 MI. 2 Sisters, 1 Pt of GMA Vanessa Kick (CHF) 1 Son   Review of Systems:  Review of Systems - Full ROS obtained. CVA. Global Weakness. Dysarthria. Aphasia    Physical Exam:  Blood pressure 151/81, pulse 65, temperature 98.1 F (36.7 C), temperature source Oral, resp. rate 19, SpO2 99.00%. Filed Vitals:   11/22/12 1130 11/22/12 1145 11/22/12 1200 11/22/12 1326  BP: 160/119 141/87 139/65 151/81  Pulse: 70 63 67 65  Temp:      TempSrc:      Resp: 15 17 18 19   SpO2: 98% 96% 98% 99%   General appearance: Alert, oriented, speech dysarthric speech. Very frustrated trying to talk. Tongue slightly to R. Head: Normocephalic, bandaid on R lip Eyes: conjunctivae/corneas clear. PERRL, EOM's intact.  Nose: Nares normal. Septum midline. Mucosa normal. No drainage or sinus tenderness. Throat/Op dry  Neck: no adenopathy, no carotid bruit, no JVD and thyroid not enlarged, symmetric, no tenderness/mass/nodules Resp: CTA Cardio: reg, no murmur.  Reg Rhythm on Tele GI: soft, non-tender; bowel sounds normal; no masses,  no organomegaly Extremities: extremities s edema r side upper and Lower Ext 4/5 weakness noted. Pulses: 2+ and symmetric Lymph nodes: no cervical lymphadenopathy Neurologic: See above    Labs on Admission:   Recent Labs  11/22/12 1030  NA 139  K 4.0  CL 105  CO2 24  GLUCOSE 107*  BUN 10  CREATININE 0.65  CALCIUM 8.9    Recent Labs  11/22/12 1030  AST 12  ALT 8  ALKPHOS 46  BILITOT 0.4  PROT 6.8  ALBUMIN 3.7   No results found for this basename: LIPASE, AMYLASE,  in the last 72 hours  Recent Labs  11/22/12 1030  WBC 6.2  NEUTROABS 4.6  HGB 12.2  HCT 35.3*  MCV 87.8  PLT 253   No results found for this basename: CKTOTAL, CKMB, CKMBINDEX, TROPONINI,  in the last 72 hours Lab Results  Component Value Date   INR 0.94 11/22/2012   INR 0.95 10/11/2012     LAB RESULT POCT:  Results for orders placed during the hospital  encounter of 11/22/12  CBC WITH DIFFERENTIAL      Result Value Range   WBC 6.2  4.0 - 10.5 K/uL   RBC 4.02  3.87 - 5.11 MIL/uL   Hemoglobin 12.2  12.0 - 15.0 g/dL   HCT 41.3 (*) 24.4 - 01.0 %   MCV 87.8  78.0 - 100.0 fL   MCH 30.3  26.0 - 34.0 pg   MCHC 34.6  30.0 - 36.0 g/dL   RDW 27.2  53.6 - 64.4 %   Platelets 253  150 -  400 K/uL   Neutrophils Relative % 73  43 - 77 %   Neutro Abs 4.6  1.7 - 7.7 K/uL   Lymphocytes Relative 19  12 - 46 %   Lymphs Abs 1.2  0.7 - 4.0 K/uL   Monocytes Relative 7  3 - 12 %   Monocytes Absolute 0.4  0.1 - 1.0 K/uL   Eosinophils Relative 1  0 - 5 %   Eosinophils Absolute 0.1  0.0 - 0.7 K/uL   Basophils Relative 0  0 - 1 %   Basophils Absolute 0.0  0.0 - 0.1 K/uL  COMPREHENSIVE METABOLIC PANEL      Result Value Range   Sodium 139  135 - 145 mEq/L   Potassium 4.0  3.5 - 5.1 mEq/L   Chloride 105  96 - 112 mEq/L   CO2 24  19 - 32 mEq/L   Glucose, Bld 107 (*) 70 - 99 mg/dL   BUN 10  6 - 23 mg/dL   Creatinine, Ser 1.61  0.50 - 1.10 mg/dL   Calcium 8.9  8.4 - 09.6 mg/dL   Total Protein 6.8  6.0 - 8.3 g/dL   Albumin 3.7  3.5 - 5.2 g/dL   AST 12  0 - 37 U/L   ALT 8  0 - 35 U/L   Alkaline Phosphatase 46  39 - 117 U/L   Total Bilirubin 0.4  0.3 - 1.2 mg/dL   GFR calc non Af Amer 83 (*) >90 mL/min   GFR calc Af Amer >90  >90 mL/min  URINALYSIS, ROUTINE W REFLEX MICROSCOPIC      Result Value Range   Color, Urine YELLOW  YELLOW   APPearance CLEAR  CLEAR   Specific Gravity, Urine 1.009  1.005 - 1.030   pH 6.5  5.0 - 8.0   Glucose, UA NEGATIVE  NEGATIVE mg/dL   Hgb urine dipstick MODERATE (*) NEGATIVE   Bilirubin Urine NEGATIVE  NEGATIVE   Ketones, ur NEGATIVE  NEGATIVE mg/dL   Protein, ur NEGATIVE  NEGATIVE mg/dL   Urobilinogen, UA 0.2  0.0 - 1.0 mg/dL   Nitrite NEGATIVE  NEGATIVE   Leukocytes, UA NEGATIVE  NEGATIVE  GLUCOSE, CAPILLARY      Result Value Range   Glucose-Capillary 115 (*) 70 - 99 mg/dL  URINE MICROSCOPIC-ADD ON      Result Value  Range   RBC / HPF 7-10  <3 RBC/hpf  ETHANOL      Result Value Range   Alcohol, Ethyl (B) <11  0 - 11 mg/dL  PROTIME-INR      Result Value Range   Prothrombin Time 12.4  11.6 - 15.2 seconds   INR 0.94  0.00 - 1.49  APTT      Result Value Range   aPTT 26  24 - 37 seconds  URINE RAPID DRUG SCREEN (HOSP PERFORMED)      Result Value Range   Opiates NONE DETECTED  NONE DETECTED   Cocaine NONE DETECTED  NONE DETECTED   Benzodiazepines NONE DETECTED  NONE DETECTED   Amphetamines NONE DETECTED  NONE DETECTED   Tetrahydrocannabinol NONE DETECTED  NONE DETECTED   Barbiturates NONE DETECTED  NONE DETECTED  POCT I-STAT TROPONIN I      Result Value Range   Troponin i, poc 0.02  0.00 - 0.08 ng/mL   Comment 3               Radiological Exams on Admission: Mr [facility] Contrast  11/22/2012  CLINICAL DATA:  Nausea. Speech difficulty.  Evaluate for stroke.  EXAM: MRI HEAD WITHOUT CONTRAST  MRA HEAD WITHOUT CONTRAST  TECHNIQUE: Multiplanar, multiecho pulse sequences of the brain and surrounding structures were obtained without intravenous contrast. Angiographic images of the head were obtained using MRA technique without contrast.  COMPARISON:  CT head 10/19/2012 and 10/11/2012.  FINDINGS: MRI HEAD FINDINGS  Multiple foci of acute infarction are present in the posterior circulation. The largest area of infarction affects the left much greater than right mid to upper pons. Smaller multifocal areas of acute infarction are seen in the right and left cerebellar hemispheres, and left occipital cortex and white matter. There is no associated acute or chronic hemorrhage. Flow voids are maintained.  Moderate atrophy is present. Chronic microvascular ischemic change affects the periventricular and subcortical white matter. Prominent perivascular spaces suggests longstanding hypertension. Remote left basal ganglia and left periventricular white matter lacunar infarcts. No chronic large vessel infarct.  Scalp and  extracranial soft tissues unremarkable. Negative osseous structures. Bilateral cataract extraction. Chronic sinus disease with moderate retention cyst left maxillary region. No acute mastoid fluid.  Compared with prior CT, these findings are not visible.  MRA HEAD FINDINGS  The internal carotid arteries are patent throughout their upper cervical, skull base, and cavernous segments. There is mild non-stenotic irregularity of the supraclinoid ICA vessels. ICA termini are widely patent. Mild irregularity both proximal anterior cerebral arteries. Mild irregularity of both proximal middle cerebral arteries. Fetal origin right PCA with mild irregularity of both proximal posterior cerebral vessels. No intracranial aneurysm.  There is moderate non stenotic irregularity of the proximal basilar artery (arrows) with the left vertebral as the dominant contributor. There is severe disease of the much smaller distal right vertebral with multiple tandem high-grade stenoses in its distal V4 segment. No stenosis or irregularity of the distal left vertebral. No flow-limiting stenosis of the basilar.  IMPRESSION: MRI HEAD IMPRESSION  Multifocal areas of acute infarction within the posterior circulation. The area of most significant involvement includes the left greater than right mid to upper pons. Atrophy and chronic microvascular ischemic change. Findings discussed with ordering provider.  MRA HEAD IMPRESSION  Mild non stenotic irregularity of the proximal basilar artery and both supraclinoid internal carotid arteries. No flow-limiting basilar stenosis. Severe irregularity distal right vertebral.   Electronically Signed   By: Davonna Belling M.D.   On: 11/22/2012 13:43   Mr Brain Wo Contrast  11/22/2012   CLINICAL DATA:  Nausea. Speech difficulty.  Evaluate for stroke.  EXAM: MRI HEAD WITHOUT CONTRAST  MRA HEAD WITHOUT CONTRAST  TECHNIQUE: Multiplanar, multiecho pulse sequences of the brain and surrounding structures were obtained  without intravenous contrast. Angiographic images of the head were obtained using MRA technique without contrast.  COMPARISON:  CT head 10/19/2012 and 10/11/2012.  FINDINGS: MRI HEAD FINDINGS  Multiple foci of acute infarction are present in the posterior circulation. The largest area of infarction affects the left much greater than right mid to upper pons. Smaller multifocal areas of acute infarction are seen in the right and left cerebellar hemispheres, and left occipital cortex and white matter. There is no associated acute or chronic hemorrhage. Flow voids are maintained.  Moderate atrophy is present. Chronic microvascular ischemic change affects the periventricular and subcortical white matter. Prominent perivascular spaces suggests longstanding hypertension. Remote left basal ganglia and left periventricular white matter lacunar infarcts. No chronic large vessel infarct.  Scalp and extracranial soft tissues unremarkable. Negative osseous structures. Bilateral cataract extraction. Chronic sinus  disease with moderate retention cyst left maxillary region. No acute mastoid fluid.  Compared with prior CT, these findings are not visible.  MRA HEAD FINDINGS  The internal carotid arteries are patent throughout their upper cervical, skull base, and cavernous segments. There is mild non-stenotic irregularity of the supraclinoid ICA vessels. ICA termini are widely patent. Mild irregularity both proximal anterior cerebral arteries. Mild irregularity of both proximal middle cerebral arteries. Fetal origin right PCA with mild irregularity of both proximal posterior cerebral vessels. No intracranial aneurysm.  There is moderate non stenotic irregularity of the proximal basilar artery (arrows) with the left vertebral as the dominant contributor. There is severe disease of the much smaller distal right vertebral with multiple tandem high-grade stenoses in its distal V4 segment. No stenosis or irregularity of the distal left  vertebral. No flow-limiting stenosis of the basilar.  IMPRESSION: MRI HEAD IMPRESSION  Multifocal areas of acute infarction within the posterior circulation. The area of most significant involvement includes the left greater than right mid to upper pons. Atrophy and chronic microvascular ischemic change. Findings discussed with ordering provider.  MRA HEAD IMPRESSION  Mild non stenotic irregularity of the proximal basilar artery and both supraclinoid internal carotid arteries. No flow-limiting basilar stenosis. Severe irregularity distal right vertebral.   Electronically Signed   By: Davonna Belling M.D.   On: 11/22/2012 13:43      Orders placed during the hospital encounter of 11/22/12  . ED EKG  . ED EKG  . EKG 12-LEAD  . EKG 12-LEAD     Assessment/Plan Principal Problem:   CVA (cerebral infarction) Active Problems:   Aphasia   HTN (hypertension)   Hyperlipidemia  Acute-subacute CVA - See MRI Report and Sxs of dysarthria and right hemiparesis Dr Thad Ranger from Neuro consulted. Her Recs: 1. ASA 81 mg to be taken along with Plavix 75mg   2. Echocardiogram  3. Will obtain records on previous work up  = See my HPI for Details 4. Frequent neuro checks  5. PT/OT/Speech therapy Her RFs had been well controlled. Admit and continue eval. Will need PT/OT/CW and admission to SNF after acute hospitalization done. Tele. She was on ASA until August and switched to Plavix. Dr Basilio Cairo recommends both. ST ordered as she failed bedside swallow.  Memory loss  Sleepy due to Ativan?  HTN - Normally well controlled at home and in office.  Continue current meds.  BP erratic in ED.  Do not lower too fast.  Lipids on Simvastatin c last LDL 72.  OSTEOPOROSIS - on Reclast and Ca/D  Former smoker  DVT Proph.  Devaris Quirk M 11/22/2012, 2:09 PM

## 2012-11-22 NOTE — ED Notes (Signed)
Pt to department via EMS- pt to department from home, reports she started feeling bad last night. LSN around 2200 last night. Pt with hx of TIA's over the past couple of months. Pt reports she is having trouble with her words and also feels nauseated. Pt A&Ox4 on arrival. Bp-153/100 HR-68 CBG-84

## 2012-11-22 NOTE — ED Notes (Signed)
Dr. Reynolds at the bedside.  

## 2012-11-22 NOTE — ED Notes (Signed)
Neurology MD at bedside

## 2012-11-22 NOTE — Consult Note (Signed)
Referring Physician: Patria Mane    Chief Complaint: Stroke  HPI:                                                                                                                                         Jane Farley is an 79 y.o. female with previous left thalamic stroke noted by CT on 10/11/12. Patient is a poor historian but states she felt sick last night and called her sister.  Due to having dysarthric speech her sister brought her to hospital. Per note from Dr. Timothy Lasso patient recently suffered from a CVA in April and had a stroke work up "showing Multifocal regions of Iscemic changes in L Cerebellar hemisphere and L Occipital Lobe. Small additional lesions are present within the medial L Temporal Lobe and Upper Thalmus. Moderately advanced chronic microvascular disease. Maxillary Retention cyst. Negative MRA of Carotids. Developmental varients of the circle of Willis without stenosis. Carotid Dopplers = Multifocal plaque L>R carotids with < 60% stenosis B."  Patient states she recently was placed on Plavix one week ago. Currently she is showing a right facial droop, severely dysarthric, showing right arm and legs weakness.    Date last known well: Date: 11/21/2012 Time last known well: Time: 21:00 tPA Given: No: out of window  Past Medical History  Diagnosis Date  . Hemorrhoids   . Nephrolithiasis   . Hypercholesterolemia   . Hypertension   . Osteoporosis   . Stroke     TIA    Past Surgical History  Procedure Laterality Date  . Breast lumpectomy  02/23/1965    left  . Exploratory laparotomy with abdominal mass excision  12/08/2010    Family History  Problem Relation Age of Onset  . Diabetes Father   . Diabetes Brother    Social History:  reports that she quit smoking about 10 years ago. She has never used smokeless tobacco. She reports that she does not drink alcohol or use illicit drugs.  Allergies: No Known Allergies  Medications:                                                                                                                            No current facility-administered medications for this encounter.   Current Outpatient Prescriptions  Medication Sig Dispense Refill  . calcium carbonate (OS-CAL) 600 MG TABS Take 600 mg by mouth  daily.       . clopidogrel (PLAVIX) 75 MG tablet Take 1 tablet (75 mg total) by mouth daily.  30 tablet  0  . losartan (COZAAR) 100 MG tablet Take 100 mg by mouth daily.        Marland Kitchen OVER THE COUNTER MEDICATION Take 15 mLs by mouth daily as needed (sleep).      . simvastatin (ZOCOR) 20 MG tablet Take 20 mg by mouth at bedtime.        . Zoledronic Acid (RECLAST IV) Inject into the vein. Once a year          ROS:                                                                                                                                       History obtained from the patient   General ROS: negative for - chills, fatigue, fever, night sweats, weight gain or weight loss Psychological ROS: negative for - behavioral disorder, hallucinations, memory difficulties, mood swings or suicidal ideation Ophthalmic ROS: negative for - blurry vision, double vision, eye pain or loss of vision ENT ROS: negative for - epistaxis, nasal discharge, oral lesions, sore throat, tinnitus or vertigo Allergy and Immunology ROS: negative for - hives or itchy/watery eyes Hematological and Lymphatic ROS: negative for - bleeding problems, bruising or swollen lymph nodes Endocrine ROS: negative for - galactorrhea, hair pattern changes, polydipsia/polyuria or temperature intolerance Respiratory ROS: negative for - cough, hemoptysis, shortness of breath or wheezing Cardiovascular ROS: negative for - chest pain, dyspnea on exertion, edema or irregular heartbeat Gastrointestinal ROS: negative for - abdominal pain, diarrhea, hematemesis, nausea/vomiting or stool incontinence Genito-Urinary ROS: negative for - dysuria, hematuria, incontinence or urinary  frequency/urgency Musculoskeletal ROS: negative for - joint swelling or muscular weakness Neurological ROS: as noted in HPI Dermatological ROS: negative for rash and skin lesion changes  Neurologic Examination:                                                                                                      Blood pressure 151/81, pulse 65, temperature 98.1 F (36.7 C), temperature source Oral, resp. rate 19, SpO2 99.00%.  Mental Status: Alert, oriented, speech dysarthric without evidence of aphasia.  Able to follow 3 step commands without difficulty. Cranial Nerves: II: Discs flat bilaterally; Visual fields grossly normal, pupils equal, round, reactive to light and accommodation III,IV, VI: ptosis not present, extra-ocular motions intact bilaterally V,VII: smile asymmetric on the right, facial light  touch sensation normal bilaterally VIII: hearing normal bilaterally IX,X: gag reflex present XI: bilateral shoulder shrug XII: midline tongue extension Motor: Right : Upper extremity   4/5    Left:     Upper extremity   5/5  Lower extremity   4/5     Lower extremity   5/5 --Drift on the right UE and LE Tone and bulk:normal tone throughout; no atrophy noted Sensory: Pinprick and light touch intact throughout, bilaterally Deep Tendon Reflexes:  Right: Upper Extremity   Left: Upper extremity   biceps (C-5 to C-6) 2/4   biceps (C-5 to C-6) 2/4 tricep (C7) 2/4    triceps (C7) 2/4 Brachioradialis (C6) 2/4  Brachioradialis (C6) 2/4 --brisk bilaterallyy  Lower Extremity Lower Extremity  quadriceps (L-2 to L-4) 2/4   quadriceps (L-2 to L-4) 2/4 Achilles (S1) 2/4   Achilles (S1) 2/4 Plantars: Up going bilaterally Cerebellar: normal finger-to-nose,  normal heel-to-shin test Gait: not tested CV: pulses palpable throughout    Results for orders placed during the hospital encounter of 11/22/12 (from the past 48 hour(s))  GLUCOSE, CAPILLARY     Status: Abnormal   Collection Time     11/22/12 10:27 AM      Result Value Range   Glucose-Capillary 115 (*) 70 - 99 mg/dL  CBC WITH DIFFERENTIAL     Status: Abnormal   Collection Time    11/22/12 10:30 AM      Result Value Range   WBC 6.2  4.0 - 10.5 K/uL   RBC 4.02  3.87 - 5.11 MIL/uL   Hemoglobin 12.2  12.0 - 15.0 g/dL   HCT 04.5 (*) 40.9 - 81.1 %   MCV 87.8  78.0 - 100.0 fL   MCH 30.3  26.0 - 34.0 pg   MCHC 34.6  30.0 - 36.0 g/dL   RDW 91.4  78.2 - 95.6 %   Platelets 253  150 - 400 K/uL   Neutrophils Relative % 73  43 - 77 %   Neutro Abs 4.6  1.7 - 7.7 K/uL   Lymphocytes Relative 19  12 - 46 %   Lymphs Abs 1.2  0.7 - 4.0 K/uL   Monocytes Relative 7  3 - 12 %   Monocytes Absolute 0.4  0.1 - 1.0 K/uL   Eosinophils Relative 1  0 - 5 %   Eosinophils Absolute 0.1  0.0 - 0.7 K/uL   Basophils Relative 0  0 - 1 %   Basophils Absolute 0.0  0.0 - 0.1 K/uL  COMPREHENSIVE METABOLIC PANEL     Status: Abnormal   Collection Time    11/22/12 10:30 AM      Result Value Range   Sodium 139  135 - 145 mEq/L   Potassium 4.0  3.5 - 5.1 mEq/L   Chloride 105  96 - 112 mEq/L   CO2 24  19 - 32 mEq/L   Glucose, Bld 107 (*) 70 - 99 mg/dL   BUN 10  6 - 23 mg/dL   Creatinine, Ser 2.13  0.50 - 1.10 mg/dL   Calcium 8.9  8.4 - 08.6 mg/dL   Total Protein 6.8  6.0 - 8.3 g/dL   Albumin 3.7  3.5 - 5.2 g/dL   AST 12  0 - 37 U/L   ALT 8  0 - 35 U/L   Alkaline Phosphatase 46  39 - 117 U/L   Total Bilirubin 0.4  0.3 - 1.2 mg/dL   GFR calc non Af Amer 83 (*) >  90 mL/min   GFR calc Af Amer >90  >90 mL/min   Comment: (NOTE)     The eGFR has been calculated using the CKD EPI equation.     This calculation has not been validated in all clinical situations.     eGFR's persistently <90 mL/min signify possible Chronic Kidney     Disease.  POCT I-STAT TROPONIN I     Status: None   Collection Time    11/22/12 10:34 AM      Result Value Range   Troponin i, poc 0.02  0.00 - 0.08 ng/mL   Comment 3            Comment: Due to the release kinetics of  cTnI,     a negative result within the first hours     of the onset of symptoms does not rule out     myocardial infarction with certainty.     If myocardial infarction is still suspected,     repeat the test at appropriate intervals.  URINALYSIS, ROUTINE W REFLEX MICROSCOPIC     Status: Abnormal   Collection Time    11/22/12 11:05 AM      Result Value Range   Color, Urine YELLOW  YELLOW   APPearance CLEAR  CLEAR   Specific Gravity, Urine 1.009  1.005 - 1.030   pH 6.5  5.0 - 8.0   Glucose, UA NEGATIVE  NEGATIVE mg/dL   Hgb urine dipstick MODERATE (*) NEGATIVE   Bilirubin Urine NEGATIVE  NEGATIVE   Ketones, ur NEGATIVE  NEGATIVE mg/dL   Protein, ur NEGATIVE  NEGATIVE mg/dL   Urobilinogen, UA 0.2  0.0 - 1.0 mg/dL   Nitrite NEGATIVE  NEGATIVE   Leukocytes, UA NEGATIVE  NEGATIVE  URINE MICROSCOPIC-ADD ON     Status: None   Collection Time    11/22/12 11:05 AM      Result Value Range   RBC / HPF 7-10  <3 RBC/hpf  URINE RAPID DRUG SCREEN (HOSP PERFORMED)     Status: None   Collection Time    11/22/12 11:05 AM      Result Value Range   Opiates NONE DETECTED  NONE DETECTED   Cocaine NONE DETECTED  NONE DETECTED   Benzodiazepines NONE DETECTED  NONE DETECTED   Amphetamines NONE DETECTED  NONE DETECTED   Tetrahydrocannabinol NONE DETECTED  NONE DETECTED   Barbiturates NONE DETECTED  NONE DETECTED   Comment:            DRUG SCREEN FOR MEDICAL PURPOSES     ONLY.  IF CONFIRMATION IS NEEDED     FOR ANY PURPOSE, NOTIFY LAB     WITHIN 5 DAYS.                LOWEST DETECTABLE LIMITS     FOR URINE DRUG SCREEN     Drug Class       Cutoff (ng/mL)     Amphetamine      1000     Barbiturate      200     Benzodiazepine   200     Tricyclics       300     Opiates          300     Cocaine          300     THC              50  ETHANOL     Status: None  Collection Time    11/22/12 12:17 PM      Result Value Range   Alcohol, Ethyl (B) <11  0 - 11 mg/dL   Comment:            LOWEST  DETECTABLE LIMIT FOR     SERUM ALCOHOL IS 11 mg/dL     FOR MEDICAL PURPOSES ONLY  PROTIME-INR     Status: None   Collection Time    11/22/12 12:17 PM      Result Value Range   Prothrombin Time 12.4  11.6 - 15.2 seconds   INR 0.94  0.00 - 1.49  APTT     Status: None   Collection Time    11/22/12 12:17 PM      Result Value Range   aPTT 26  24 - 37 seconds   Mr [facility] Contrast  11/22/2012   CLINICAL DATA:  Nausea. Speech difficulty.  Evaluate for stroke.  EXAM: MRI HEAD WITHOUT CONTRAST  MRA HEAD WITHOUT CONTRAST  TECHNIQUE: Multiplanar, multiecho pulse sequences of the brain and surrounding structures were obtained without intravenous contrast. Angiographic images of the head were obtained using MRA technique without contrast.  COMPARISON:  CT head 10/19/2012 and 10/11/2012.  FINDINGS: MRI HEAD FINDINGS  Multiple foci of acute infarction are present in the posterior circulation. The largest area of infarction affects the left much greater than right mid to upper pons. Smaller multifocal areas of acute infarction are seen in the right and left cerebellar hemispheres, and left occipital cortex and white matter. There is no associated acute or chronic hemorrhage. Flow voids are maintained.  Moderate atrophy is present. Chronic microvascular ischemic change affects the periventricular and subcortical white matter. Prominent perivascular spaces suggests longstanding hypertension. Remote left basal ganglia and left periventricular white matter lacunar infarcts. No chronic large vessel infarct.  Scalp and extracranial soft tissues unremarkable. Negative osseous structures. Bilateral cataract extraction. Chronic sinus disease with moderate retention cyst left maxillary region. No acute mastoid fluid.  Compared with prior CT, these findings are not visible.  MRA HEAD FINDINGS  The internal carotid arteries are patent throughout their upper cervical, skull base, and cavernous segments. There is mild  non-stenotic irregularity of the supraclinoid ICA vessels. ICA termini are widely patent. Mild irregularity both proximal anterior cerebral arteries. Mild irregularity of both proximal middle cerebral arteries. Fetal origin right PCA with mild irregularity of both proximal posterior cerebral vessels. No intracranial aneurysm.  There is moderate non stenotic irregularity of the proximal basilar artery (arrows) with the left vertebral as the dominant contributor. There is severe disease of the much smaller distal right vertebral with multiple tandem high-grade stenoses in its distal V4 segment. No stenosis or irregularity of the distal left vertebral. No flow-limiting stenosis of the basilar.  IMPRESSION: MRI HEAD IMPRESSION  Multifocal areas of acute infarction within the posterior circulation. The area of most significant involvement includes the left greater than right mid to upper pons. Atrophy and chronic microvascular ischemic change. Findings discussed with ordering provider.  MRA HEAD IMPRESSION  Mild non stenotic irregularity of the proximal basilar artery and both supraclinoid internal carotid arteries. No flow-limiting basilar stenosis. Severe irregularity distal right vertebral.   Electronically Signed   By: Davonna Belling M.D.   On: 11/22/2012 13:43   Mr [facility] Contrast  11/22/2012   CLINICAL DATA:  Nausea. Speech difficulty.  Evaluate for stroke.  EXAM: MRI HEAD WITHOUT CONTRAST  MRA HEAD WITHOUT CONTRAST  TECHNIQUE: Multiplanar, multiecho pulse  sequences of the brain and surrounding structures were obtained without intravenous contrast. Angiographic images of the head were obtained using MRA technique without contrast.  COMPARISON:  CT head 10/19/2012 and 10/11/2012.  FINDINGS: MRI HEAD FINDINGS  Multiple foci of acute infarction are present in the posterior circulation. The largest area of infarction affects the left much greater than right mid to upper pons. Smaller multifocal areas of acute  infarction are seen in the right and left cerebellar hemispheres, and left occipital cortex and white matter. There is no associated acute or chronic hemorrhage. Flow voids are maintained.  Moderate atrophy is present. Chronic microvascular ischemic change affects the periventricular and subcortical white matter. Prominent perivascular spaces suggests longstanding hypertension. Remote left basal ganglia and left periventricular white matter lacunar infarcts. No chronic large vessel infarct.  Scalp and extracranial soft tissues unremarkable. Negative osseous structures. Bilateral cataract extraction. Chronic sinus disease with moderate retention cyst left maxillary region. No acute mastoid fluid.  Compared with prior CT, these findings are not visible.  MRA HEAD FINDINGS  The internal carotid arteries are patent throughout their upper cervical, skull base, and cavernous segments. There is mild non-stenotic irregularity of the supraclinoid ICA vessels. ICA termini are widely patent. Mild irregularity both proximal anterior cerebral arteries. Mild irregularity of both proximal middle cerebral arteries. Fetal origin right PCA with mild irregularity of both proximal posterior cerebral vessels. No intracranial aneurysm.  There is moderate non stenotic irregularity of the proximal basilar artery (arrows) with the left vertebral as the dominant contributor. There is severe disease of the much smaller distal right vertebral with multiple tandem high-grade stenoses in its distal V4 segment. No stenosis or irregularity of the distal left vertebral. No flow-limiting stenosis of the basilar.  IMPRESSION: MRI HEAD IMPRESSION  Multifocal areas of acute infarction within the posterior circulation. The area of most significant involvement includes the left greater than right mid to upper pons. Atrophy and chronic microvascular ischemic change. Findings discussed with ordering provider.  MRA HEAD IMPRESSION  Mild non stenotic  irregularity of the proximal basilar artery and both supraclinoid internal carotid arteries. No flow-limiting basilar stenosis. Severe irregularity distal right vertebral.   Electronically Signed   By: Davonna Belling M.D.   On: 11/22/2012 13:43   Echo 06/14/12 Study Conclusions  - Left ventricle: The cavity size was normal. Wall thickness was increased in a pattern of mild LVH. Systolic function was normal. The estimated ejection fraction was in the range of 60% to 65%. Wall motion was normal; there were no regional wall motion abnormalities. There was an increased relative contribution of atrial contraction to ventricular filling. - Pulmonary arteries: PA peak pressure: 33mm Hg (S).   Felicie Morn PA-C [facility] [phone]  11/22/2012, 2:50 PM   Patient seen and examined.  Clinical course and management discussed.  Necessary edits performed.  I agree with the above.  Assessment and plan of care developed and discussed below.    Assessment: 79 y.o. female presenting with dysarthria and right hemiparesis.  Recently changed to Plavix from ASA.  MRI reviewed and shows scattered bilateral acute infarcts in the distribution of the posterior circulation.  Although these may be embolic origin, can not rule out a posterior circulation etiology.  MRA does show stenosis but not flow limiting.   Patient outside time window for tPA.  Stroke Risk Factors - stroke  Recommendations: 1.  ASA 81 mg to be taken along with Plavix 75mg  2.  Echocardiogram 3.  Will obtain records on  previous work up 4.  Frequent neuro checks 5.  PT/OT/Speech therapy  Thana Farr, MD [facility] [phone]  11/22/2012  4:54 PM

## 2012-11-22 NOTE — ED Notes (Signed)
Provider at the bedside to talk with family and patient.

## 2012-11-23 ENCOUNTER — Inpatient Hospital Stay (HOSPITAL_COMMUNITY): Payer: Medicare Other

## 2012-11-23 DIAGNOSIS — G8190 Hemiplegia, unspecified affecting unspecified side: Secondary | ICD-10-CM | POA: Diagnosis not present

## 2012-11-23 DIAGNOSIS — I679 Cerebrovascular disease, unspecified: Secondary | ICD-10-CM | POA: Diagnosis not present

## 2012-11-23 DIAGNOSIS — I6350 Cerebral infarction due to unspecified occlusion or stenosis of unspecified cerebral artery: Secondary | ICD-10-CM | POA: Diagnosis not present

## 2012-11-23 DIAGNOSIS — I369 Nonrheumatic tricuspid valve disorder, unspecified: Secondary | ICD-10-CM | POA: Diagnosis not present

## 2012-11-23 DIAGNOSIS — I69920 Aphasia following unspecified cerebrovascular disease: Secondary | ICD-10-CM | POA: Diagnosis not present

## 2012-11-23 DIAGNOSIS — E785 Hyperlipidemia, unspecified: Secondary | ICD-10-CM | POA: Diagnosis not present

## 2012-11-23 DIAGNOSIS — I10 Essential (primary) hypertension: Secondary | ICD-10-CM | POA: Diagnosis not present

## 2012-11-23 LAB — CBC
HCT: 34.2 % — ABNORMAL LOW (ref 36.0–46.0)
Hemoglobin: 11.9 g/dL — ABNORMAL LOW (ref 12.0–15.0)
MCHC: 34.8 g/dL (ref 30.0–36.0)
MCV: 87.9 fL (ref 78.0–100.0)
RDW: 13.1 % (ref 11.5–15.5)
WBC: 5.6 10*3/uL (ref 4.0–10.5)

## 2012-11-23 LAB — COMPREHENSIVE METABOLIC PANEL
ALT: 7 U/L (ref 0–35)
BUN: 10 mg/dL (ref 6–23)
CO2: 25 mEq/L (ref 19–32)
Calcium: 8.5 mg/dL (ref 8.4–10.5)
GFR calc Af Amer: >90 mL/min (ref >90)
GFR calc non Af Amer: 82 mL/min — ABNORMAL LOW (ref >90)
Glucose, Bld: 89 mg/dL (ref 70–99)
Total Bilirubin: 0.5 mg/dL (ref 0.3–1.2)
Total Protein: 6.4 g/dL (ref 6.0–8.3)

## 2012-11-23 LAB — URINE CULTURE: Culture: NO GROWTH

## 2012-11-23 LAB — HEMOGLOBIN A1C
Hgb A1c MFr Bld: 6 % — ABNORMAL HIGH (ref <5.7)
Mean Plasma Glucose: 126 mg/dL — ABNORMAL HIGH (ref <117)

## 2012-11-23 MED ORDER — IOHEXOL 350 MG/ML SOLN: 50.0000 mL | Freq: Once | INTRAVENOUS | Status: AC | PRN

## 2012-11-23 MED ORDER — ENSURE COMPLETE PO LIQD: 237.0000 mL | Freq: Two times a day (BID) | ORAL | Status: DC | PRN

## 2012-11-23 NOTE — Progress Notes (Signed)
*  PRELIMINARY RESULTS* Echocardiogram 2D Echocardiogram has been performed.  Jane Farley 11/23/2012, 12:12 PM

## 2012-11-23 NOTE — Progress Notes (Signed)
Subjective: She is by herself this AM, has a son who lives locally who will be here later today.  She feels frustrated.  Having some difficulty pronouncing words and getting them out.  Objective: Vital signs in last 24 hours: Temp:  [97.9 F (36.6 C)-98.9 F (37.2 C)] 98.4 F (36.9 C) (10/01 0600) Pulse Rate:  [57-96] 65 (10/01 0600) Resp:  [14-25] 18 (10/01 0600) BP: (119-168)/(63-134) 145/84 mmHg (10/01 0600) SpO2:  [92 %-99 %] 97 % (10/01 0600) Weight:  [58.514 kg (129 lb)] 58.514 kg (129 lb) (09/30 2209) Weight change:  Last BM Date: 11/21/12  Intake/Output from previous day:   Intake/Output this shift:   General appearance: Alert, oriented, dysarthric speech.  Head: Normocephalic, band-aid on lip  Eyes: conjunctivae/corneas clear. PERRL, EOM's intact.  Nose: Nares normal. Septum midline. Mucosa normal. No drainage or sinus tenderness.  Neck: no adenopathy, no carotid bruit, no JVD and thyroid not enlarged, symmetric, no tenderness/mass/nodules  Resp: Clear bilaterally Cardio: regular, no murmur. GI: soft, non-tender; bowel sounds normal; no masses, no organomegaly  Extremities: extremities s edema  r side upper and Lower Ext 4/5 weakness noted.  Pulses: 2+ and symmetric  Lymph nodes: no cervical lymphadenopathy  Neurologic: Grip weaker on R side, less leg strength on R as well.  Noted dysarthria   Lab Results:  Recent Labs  11/22/12 1030 11/23/12 0600  WBC 6.2 5.6  HGB 12.2 11.9*  HCT 35.3* 34.2*  PLT 253 224   BMET  Recent Labs  11/22/12 1030 11/23/12 0600  NA 139 139  K 4.0 3.5  CL 105 104  CO2 24 25  GLUCOSE 107* 89  BUN 10 10  CREATININE 0.65 0.69  CALCIUM 8.9 8.5    Studies/Results: Mr [facility] Contrast  11/22/2012   CLINICAL DATA:  Nausea. Speech difficulty.  Evaluate for stroke.  EXAM: MRI HEAD WITHOUT CONTRAST  MRA HEAD WITHOUT CONTRAST  TECHNIQUE: Multiplanar, multiecho pulse sequences of the brain and surrounding structures were  obtained without intravenous contrast. Angiographic images of the head were obtained using MRA technique without contrast.  COMPARISON:  CT head 10/19/2012 and 10/11/2012.  FINDINGS: MRI HEAD FINDINGS  Multiple foci of acute infarction are present in the posterior circulation. The largest area of infarction affects the left much greater than right mid to upper pons. Smaller multifocal areas of acute infarction are seen in the right and left cerebellar hemispheres, and left occipital cortex and white matter. There is no associated acute or chronic hemorrhage. Flow voids are maintained.  Moderate atrophy is present. Chronic microvascular ischemic change affects the periventricular and subcortical white matter. Prominent perivascular spaces suggests longstanding hypertension. Remote left basal ganglia and left periventricular white matter lacunar infarcts. No chronic large vessel infarct.  Scalp and extracranial soft tissues unremarkable. Negative osseous structures. Bilateral cataract extraction. Chronic sinus disease with moderate retention cyst left maxillary region. No acute mastoid fluid.  Compared with prior CT, these findings are not visible.  MRA HEAD FINDINGS  The internal carotid arteries are patent throughout their upper cervical, skull base, and cavernous segments. There is mild non-stenotic irregularity of the supraclinoid ICA vessels. ICA termini are widely patent. Mild irregularity both proximal anterior cerebral arteries. Mild irregularity of both proximal middle cerebral arteries. Fetal origin right PCA with mild irregularity of both proximal posterior cerebral vessels. No intracranial aneurysm.  There is moderate non stenotic irregularity of the proximal basilar artery (arrows) with the left vertebral as the dominant contributor. There is severe disease  of the much smaller distal right vertebral with multiple tandem high-grade stenoses in its distal V4 segment. No stenosis or irregularity of the  distal left vertebral. No flow-limiting stenosis of the basilar.  IMPRESSION: MRI HEAD IMPRESSION  Multifocal areas of acute infarction within the posterior circulation. The area of most significant involvement includes the left greater than right mid to upper pons. Atrophy and chronic microvascular ischemic change. Findings discussed with ordering provider.  MRA HEAD IMPRESSION  Mild non stenotic irregularity of the proximal basilar artery and both supraclinoid internal carotid arteries. No flow-limiting basilar stenosis. Severe irregularity distal right vertebral.   Electronically Signed   By: Davonna Belling M.D.   On: 11/22/2012 13:43   Mr Brain Wo Contrast  11/22/2012   CLINICAL DATA:  Nausea. Speech difficulty.  Evaluate for stroke.  EXAM: MRI HEAD WITHOUT CONTRAST  MRA HEAD WITHOUT CONTRAST  TECHNIQUE: Multiplanar, multiecho pulse sequences of the brain and surrounding structures were obtained without intravenous contrast. Angiographic images of the head were obtained using MRA technique without contrast.  COMPARISON:  CT head 10/19/2012 and 10/11/2012.  FINDINGS: MRI HEAD FINDINGS  Multiple foci of acute infarction are present in the posterior circulation. The largest area of infarction affects the left much greater than right mid to upper pons. Smaller multifocal areas of acute infarction are seen in the right and left cerebellar hemispheres, and left occipital cortex and white matter. There is no associated acute or chronic hemorrhage. Flow voids are maintained.  Moderate atrophy is present. Chronic microvascular ischemic change affects the periventricular and subcortical white matter. Prominent perivascular spaces suggests longstanding hypertension. Remote left basal ganglia and left periventricular white matter lacunar infarcts. No chronic large vessel infarct.  Scalp and extracranial soft tissues unremarkable. Negative osseous structures. Bilateral cataract extraction. Chronic sinus disease with  moderate retention cyst left maxillary region. No acute mastoid fluid.  Compared with prior CT, these findings are not visible.  MRA HEAD FINDINGS  The internal carotid arteries are patent throughout their upper cervical, skull base, and cavernous segments. There is mild non-stenotic irregularity of the supraclinoid ICA vessels. ICA termini are widely patent. Mild irregularity both proximal anterior cerebral arteries. Mild irregularity of both proximal middle cerebral arteries. Fetal origin right PCA with mild irregularity of both proximal posterior cerebral vessels. No intracranial aneurysm.  There is moderate non stenotic irregularity of the proximal basilar artery (arrows) with the left vertebral as the dominant contributor. There is severe disease of the much smaller distal right vertebral with multiple tandem high-grade stenoses in its distal V4 segment. No stenosis or irregularity of the distal left vertebral. No flow-limiting stenosis of the basilar.  IMPRESSION: MRI HEAD IMPRESSION  Multifocal areas of acute infarction within the posterior circulation. The area of most significant involvement includes the left greater than right mid to upper pons. Atrophy and chronic microvascular ischemic change. Findings discussed with ordering provider.  MRA HEAD IMPRESSION  Mild non stenotic irregularity of the proximal basilar artery and both supraclinoid internal carotid arteries. No flow-limiting basilar stenosis. Severe irregularity distal right vertebral.   Electronically Signed   By: Davonna Belling M.D.   On: 11/22/2012 13:43    Medications:  I have reviewed the patient's current medications. Scheduled: . aspirin EC  81 mg Oral Daily  . calcium carbonate  1 tablet Oral Daily  . clopidogrel  75 mg Oral Daily  . enoxaparin (LOVENOX) injection  40 mg Subcutaneous Q24H  . losartan  100 mg Oral Daily  . simvastatin  20 mg Oral QHS  . 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  Continuing antiplatelets, further workup underway per Stroke service including Echo and carotids/transcranial dopplers.  Her workup last April was done as an outpatient as she presented several days post event with ocular symptoms and confirmed CVA.  Will forward records to her chart here. Her last LDL was 72 on Simvastatin.  PT/OT and Speech evals pending, will get swallowing eval this AM to assess safety of feeding and thickener if needed.  HTN- Continue Losartan  Lipids- Simvastatin.  LOS: 1 day   Alayha Babineaux W 11/23/2012, 7:44 AM

## 2012-11-23 NOTE — Progress Notes (Signed)
Stroke Team Progress Note  HISTORY Jane Farley is an 79 y.o. female with previous left thalamic stroke noted by CT on 10/11/12. Patient is a poor historian but states she felt sick last night and called her sister. Due to having dysarthric speech her sister brought her to hospital. Per note from Dr. Timothy Lasso patient recently suffered from a CVA in April and had a stroke work up "showing Multifocal regions of Iscemic changes in L Cerebellar hemisphere and L Occipital Lobe. Small additional lesions are present within the medial L Temporal Lobe and Upper Thalmus. Moderately advanced chronic microvascular disease. Maxillary Retention cyst. Negative MRA of Carotids. Developmental varients of the circle of Willis without stenosis. Carotid Dopplers = Multifocal plaque L>R carotids with < 60% stenosis B."  Patient states she recently was placed on Plavix one week ago. Currently she is showing a right facial droop, severely dysarthric, showing right arm and legs weakness.   Date last known well: Date: 11/21/2012  Time last known well: Time: 21:00  tPA Given: No: out of window  She was admitted  for further evaluation and treatment.  SUBJECTIVE Her family is at the bedside.  Overall she feels her condition is stable. Dysarthric. Patient poor historian.   OBJECTIVE Most recent Vital Signs: Filed Vitals:   11/23/12 0400 11/23/12 0600 11/23/12 0900 11/23/12 1500  BP: 134/86 145/84 138/65 154/70  Pulse: 75 65 67 57  Temp: 98.6 F (37 C) 98.4 F (36.9 C) 98.7 F (37.1 C) 98.2 F (36.8 C)  TempSrc: Oral Oral Oral Oral  Resp: 18 18 18 20   Height:      Weight:      SpO2: 97% 97% 98% 97%   CBG (last 3)   Recent Labs  11/22/12 1027  GLUCAP 115*    IV Fluid Intake:     MEDICATIONS  . aspirin EC  81 mg Oral Daily  . calcium carbonate  1 tablet Oral Daily  . clopidogrel  75 mg Oral Daily  . enoxaparin (LOVENOX) injection  40 mg Subcutaneous Q24H  . losartan  100 mg Oral Daily  . simvastatin   20 mg Oral QHS  . sodium chloride  3 mL Intravenous Q12H   PRN:  sodium chloride, acetaminophen, acetaminophen, feeding supplement, iohexol, sodium chloride  Diet:  Cardiac thin liquids Activity:  Up with assistance DVT Prophylaxis:  Lovenox, SCD  CLINICALLY SIGNIFICANT STUDIES Basic Metabolic Panel:  Recent Labs Lab 11/22/12 1030 11/23/12 0600  NA 139 139  K 4.0 3.5  CL 105 104  CO2 24 25  GLUCOSE 107* 89  BUN 10 10  CREATININE 0.65 0.69  CALCIUM 8.9 8.5   Liver Function Tests:  Recent Labs Lab 11/22/12 1030 11/23/12 0600  AST 12 11  ALT 8 7  ALKPHOS 46 47  BILITOT 0.4 0.5  PROT 6.8 6.4  ALBUMIN 3.7 3.3*   CBC:  Recent Labs Lab 11/22/12 1030 11/23/12 0600  WBC 6.2 5.6  NEUTROABS 4.6  --   HGB 12.2 11.9*  HCT 35.3* 34.2*  MCV 87.8 87.9  PLT 253 224   Coagulation:  Recent Labs Lab 11/22/12 1217  LABPROT 12.4  INR 0.94   Cardiac Enzymes: No results found for this basename: CKTOTAL, CKMB, CKMBINDEX, TROPONINI,  in the last 168 hours Urinalysis:  Recent Labs Lab 11/22/12 1105  COLORURINE YELLOW  LABSPEC 1.009  PHURINE 6.5  GLUCOSEU NEGATIVE  HGBUR MODERATE*  BILIRUBINUR NEGATIVE  KETONESUR NEGATIVE  PROTEINUR NEGATIVE  UROBILINOGEN 0.2  NITRITE NEGATIVE  LEUKOCYTESUR NEGATIVE   Lipid Panel No results found for this basename: chol, trig, hdl, cholhdl, vldl, ldlcalc   HgbA1C  No results found for this basename: HGBA1C    Urine Drug Screen:     Component Value Date/Time   LABOPIA NONE DETECTED 11/22/2012 1105   COCAINSCRNUR NONE DETECTED 11/22/2012 1105   LABBENZ NONE DETECTED 11/22/2012 1105   AMPHETMU NONE DETECTED 11/22/2012 1105   THCU NONE DETECTED 11/22/2012 1105   LABBARB NONE DETECTED 11/22/2012 1105    Alcohol Level:  Recent Labs Lab 11/22/12 1217  ETH <11    [name] Wo Contrast 11/22/2012   Mild non stenotic irregularity of the proximal basilar artery and both supraclinoid internal carotid arteries. No flow-limiting  basilar stenosis. Severe irregularity distal right vertebral.     Mr Brain Wo Contrast 11/22/2012   Multifocal areas of acute infarction within the posterior circulation. The area of most significant involvement includes the left greater than right mid to upper pons. Atrophy and chronic microvascular ischemic change. Findings discussed with ordering provider.  CT of the brain    2D Echocardiogram  EF 60%, wall motion normal, No ASD or PFO.  Carotid Doppler  Bilateral: 1-39% ICA stenosis. Vertebral artery flow is antegrade  CXR    EKG  normal sinus rhythm.   Therapy Recommendations CIR  Physical Exam    Mental Status:  Alert, oriented, speech dysarthric without evidence of aphasia. Able to follow 3 step commands without difficulty.  Cranial Nerves:  II: Discs flat bilaterally; Visual fields grossly normal, pupils equal, round, reactive to light and accommodation  III,IV, VI: ptosis not present, extra-ocular motions intact bilaterally  V,VII: smile asymmetric on the right, facial light touch sensation normal bilaterally  VIII: hearing normal bilaterally  IX,X: gag reflex present  XI: bilateral shoulder shrug  XII: midline tongue extension  Motor:  Right : Upper extremity 4/5 Left: Upper extremity 5/5  Lower extremity 4/5 Lower extremity 5/5  --Drift on the right UE and LE  Tone and bulk:normal tone throughout; no atrophy noted  Sensory: Pinprick and light touch intact throughout, bilaterally  Deep Tendon Reflexes:  Right: Upper Extremity Left: Upper extremity  biceps (C-5 to C-6) 2/4 biceps (C-5 to C-6) 2/4  tricep (C7) 2/4 triceps (C7) 2/4  Brachioradialis (C6) 2/4 Brachioradialis (C6) 2/4  --brisk bilaterallyy  Lower Extremity Lower Extremity  quadriceps (L-2 to L-4) 2/4 quadriceps (L-2 to L-4) 2/4  Achilles (S1) 2/4 Achilles (S1) 2/4  Plantars:  Up going bilaterally  Cerebellar:  normal finger-to-nose, normal heel-to-shin test  Gait: not tested  CV: pulses palpable  throughout    ASSESSMENT Ms. Jane Farley is a 79 y.o. female presenting with dysarthria, right facial droop, right hemiparesis. Imaging confirms multifocal areas of acute infarction within the posterior circulation including the left greater than right mid to upper pons.  Infarct felt to be due to right vertebral artery occlusion/high grade stenosis with distal embolization  On clopidogrel 75 mg orally every day prior to admission. Now on aspirin 81 mg orally every day and clopidogrel 75 mg orally every day for secondary stroke prevention. Patient with resultant dysarthria, right hemiparesis. Work up underway.   Dyslipidemia  Hypertension  posterior  Hospital day # 1  TREATMENT/PLAN  Continue aspirin 81 mg orally every day and clopidogrel 75 mg orally every day for secondary stroke prevention.  Lipid, hgb a1c pending  CT Angio head/neck to evaluate anatomy, post circulation  CIR recommended  Risk factor management  Gwendolyn Lima. Manson Passey, [facility], MBA, MHA [facility] Pager: [phone] 11/23/2012 5:11 PM  I have personally obtained a history, examined the patient, evaluated imaging results, and formulated the assessment and plan of care. I agree with the above. Delia Heady, MD

## 2012-11-23 NOTE — Progress Notes (Signed)
Rehab Admissions Coordinator Note:  Patient was screened by Brock Ra for appropriateness for an Inpatient Acute Rehab Consult.  At this time, we are recommending Inpatient Rehab consult.  Brock Ra 11/23/2012, 11:43 AM  I can be reached at [phone].

## 2012-11-23 NOTE — Progress Notes (Signed)
VASCULAR LAB PRELIMINARY  PRELIMINARY  PRELIMINARY  PRELIMINARY  Carotid duplex  completed.    Preliminary report:  Bilateral:  1-39% ICA stenosis.  Vertebral artery flow is antegrade.      Monice Lundy, RVT 11/23/2012, 11:49 AM

## 2012-11-23 NOTE — Evaluation (Signed)
Occupational Therapy Evaluation Patient Details Name: Jane Farley MRN: [id number] DOB: 1933/02/05 Today's Date: 11/23/2012 Time: 4098-1191 OT Time Calculation (min): 30 min  OT Assessment / Plan / Recommendation History of present illness 79 yo female admitted for difficulty with words. MRI (+) for posterior CVA. Pt with hx in APril 2014 CVA with visual deficits. Pt currently with correct lens in glasses to prevent diplopia.   Clinical Impression   PT admitted with posterior CVA. Pt currently with functional limitiations due to the deficits listed below (see OT problem list).  Pt will benefit from skilled OT to increase their independence and safety with adls and balance to allow discharge CIR.     OT Assessment  Patient needs continued OT Services    Follow Up Recommendations  CIR    Barriers to Discharge      Equipment Recommendations  None recommended by OT    Recommendations for Other Services Rehab consult  Frequency  Min 2X/week    Precautions / Restrictions Precautions Precautions: Fall   Pertinent Vitals/Pain No pain "can't stand staying in this bed"    ADL  Eating/Feeding: Set up (pt with spillage on bed and gown from lunch meal) Where Assessed - Eating/Feeding: Bed level Toilet Transfer: Minimal assistance Toilet Transfer Method: Sit to stand Toilet Transfer Equipment: Raised toilet seat with arms (or 3-in-1 over toilet) Equipment Used: Gait belt;Rolling walker Transfers/Ambulation Related to ADLs: Pt required extended time to initiate transfer. Pt verbalized it takes forever for me to get out of my bed. Pt reluctant to use RW and ask "Do I really have to use that?  " Pt required (A) for sit<>Stand pt allowed extended time to attempt and unsuccessful ADL Comments: Pt reports vision is fine. She states "i see double sometimes but if i have my glasses its normal" Pt with lines in Rt lens from previous visual deficits in April 2014. Pt completing problem solving  worksheet that requires number cancellation with scanning. pt is able to complete worksheet with initial cues to complete with extended time. pt able to copy house drawing without deficits. Pt demonstrates decr transfers, incr time for bed mobility and general weakness. Pt states "i am not going to a nursing home" Pt refusing any optino other than CIR or home. Pt wants to go home tomorrow. Pt educated multiple times by PA Pam from CIR and OT that she is unable to go home tomorrow. Pt when asked "what is different" pt states I am not back to normal my speech isnt good and neither is my balance. Pt is very independent and unsafe to live alone at this time. Pt will require constant (A) for mobility. pt reports " at home I never sit there is always something to be doing"    OT Diagnosis: Generalized weakness;Disturbance of vision  OT Problem List: Decreased strength;Decreased activity tolerance;Impaired balance (sitting and/or standing);Impaired vision/perception;Decreased safety awareness;Decreased knowledge of use of DME or AE;Decreased knowledge of precautions OT Treatment Interventions: Self-care/ADL training;Therapeutic exercise;Energy conservation;Therapeutic activities;Visual/perceptual remediation/compensation;Patient/family education;Balance training   OT Goals(Current goals can be found in the care plan section) Acute Rehab OT Goals Patient Stated Goal: to go home tomorrow OT Goal Formulation: With patient Time For Goal Achievement: 12/07/12 Potential to Achieve Goals: Good ADL Goals Pt Will Perform Upper Body Bathing: with min assist;standing Pt Will Perform Lower Body Bathing: with min assist;sit to/from stand Pt Will Transfer to Toilet: with min assist;bedside commode Pt Will Perform Tub/Shower Transfer: with min assist;tub bench  Visit Information  Last OT Received On: 11/23/12 Assistance Needed: +1 History of Present Illness: 79 yo female admitted for difficulty with words. MRI (+)  for posterior CVA. Pt with hx in APril 2014 CVA with visual deficits. Pt currently with correct lens in glasses to prevent diplopia.       Prior Functioning     Home Living Family/patient expects to be discharged to:: Private residence Living Arrangements: Alone Available Help at Discharge: Available PRN/intermittently;Family (per pt, son can stay 24 hours if needed) Type of Home: House Home Access: Level entry Home Layout: One level Home Equipment: Bedside commode;Walker - 2 wheels (x4 - they are at her sisters house)  Lives With: Alone Prior Function Level of Independence: Independent Comments: driving, takes care of 9 cats, helps a second sister with transportation, cares for sister who just lost husband "I guess I'll have to stop doing that now"   Communication Communication: Expressive difficulties Dominant Hand: Right         Vision/Perception Vision - History Baseline Vision: Wears glasses all the time Visual History: Other (comment) (corrective lens and visual deficits from 05/2012) Vision - Assessment Vision Assessment: Vision tested Ocular Range of Motion: Within Functional Limits Additional Comments: pt with baseline visual deficits that include diplopia. Pt able to complete task in central vision without deficits.    Cognition  Cognition Arousal/Alertness: Awake/alert Behavior During Therapy: WFL for tasks assessed/performed;Flat affect Overall Cognitive Status: Within Functional Limits for tasks assessed    Extremity/Trunk Assessment Upper Extremity Assessment Upper Extremity Assessment: Generalized weakness Lower Extremity Assessment Lower Extremity Assessment: Generalized weakness     Mobility Bed Mobility Bed Mobility: Supine to Sit;Sitting - Scoot to Edge of Bed Supine to Sit: 4: Min guard;With rails Sitting - Scoot to Edge of Bed: 4: Min guard;With rail Details for Bed Mobility Assistance: pt needed extended time multiple attempts and extensive  effort to complete bed mobility. pt very persistent and determined to do it without therapist (A) Transfers Transfers: Sit to Stand;Stand to Sit Sit to Stand: 4: Min assist;With upper extremity assist;From bed Stand to Sit: 4: Min assist;With upper extremity assist;To chair/3-in-1 Details for Transfer Assistance: pt with uncontrolled descend to chair surface and needing BIL UE support on arm rest. Pt unable to complete sit<>Stand to lift buttock off the surface.      Exercise     Balance     End of Session OT - End of Session Activity Tolerance: Patient tolerated treatment well Patient left: in chair;with call bell/phone within reach Nurse Communication: Mobility status;Precautions  GO     Harolyn Rutherford 11/23/2012, 4:02 PM Pager: [phone]

## 2012-11-23 NOTE — Evaluation (Signed)
Physical Therapy Evaluation Patient Details Name: Jane Farley MRN: [id number] DOB: [birth year] Today's Date: 11/23/2012 Time: 4098-1191 PT Time Calculation (min): 32 min  PT Assessment / Plan / Recommendation History of Present Illness   79 yo pt with hx previous CVA 05/2010 w/ L visual field loss presented to ED where confirmed acute posterior CVA per MRI  Clinical Impression  Pt presents with significant changes in functional mobility following posterior circulation cva impacting ability to walk, now requiring assistive device and human assistance.  Will benefit from intensive rehab to restore previous independent function and has family assist to support her.  Plan to treat acutely until d/c is arranged.  Inpt Rehab consultation/screening order has been placed.      PT Assessment  Patient needs continued PT services    Follow Up Recommendations  CIR    Does the patient have the potential to tolerate intense rehabilitation      Barriers to Discharge        Equipment Recommendations  Rolling walker with 5" wheels    Recommendations for Other Services Rehab consult   Frequency Min 4X/week    Precautions / Restrictions Precautions Precautions: Fall Precaution Comments: discoordination, weakness   Pertinent Vitals/Pain C/o dizziness upon sitting; demonstrates impaired smooth pursuits to right worse than left, impaired finger to nose; BP = 176/76 upon sitting 3 mintues and 136/76 after ambulation and urination      Mobility  Bed Mobility Bed Mobility: Supine to Sit;Sitting - Scoot to Edge of Bed Supine to Sit: 4: Min assist;HOB elevated;With rails Sitting - Scoot to Edge of Bed: 4: Min assist;With rail Details for Bed Mobility Assistance: encouragement followed by physical assist to reach for rail, to pivot hips to Left EOB, and to raise trunk into sitting; used bed pad to assist Transfers Transfers: Sit to Stand;Stand to Sit Sit to Stand: 4: Min assist;From elevated  surface;With upper extremity assist;From bed;From toilet Stand to Sit: 4: Min assist;With upper extremity assist;To chair/3-in-1 Details for Transfer Assistance: physical assist to achieve liftoff from surface and to provide steady assist due to apprehension upon first time up.  Improved from toilet using rail and transitioning to RW, needs increased time and effort especially with initial liftoff and hands on standby for safety.  Occassional cues for problem solving safest hand placement Ambulation/Gait Ambulation/Gait Assistance: 3: Mod assist Ambulation Distance (Feet): 30 Feet Assistive device: Rolling walker;1 person hand held assist Ambulation/Gait Assistance Details: bed to bathroom 1 hand assist and mod assist for stability, pt reaching for external support entire distance (15 feet) and fearful.  Improved from toilet to chair with RW and increased stability needing close supervision to occassional hands on for security and verbal cues to navigate around doorway to find recliner in room Gait Pattern: Step-through pattern;Ataxic;Trunk flexed;Narrow base of support Gait velocity: decr, unable to measure in cramped space in room General Gait Details: slow, apprehensive, unsteady without external support and far from baseline of independent without device Stairs: No Wheelchair Mobility Wheelchair Mobility: No Modified Rankin (Stroke Patients Only) Pre-Morbid Rankin Score: No significant disability Modified Rankin: Moderately severe disability    Exercises     PT Diagnosis: Hemiplegia dominant side  PT Problem List: Decreased safety awareness;Decreased knowledge of use of DME;Decreased cognition;Decreased coordination;Decreased mobility;Decreased balance;Decreased activity tolerance PT Treatment Interventions: Patient/family education;Neuromuscular re-education;Balance training;Therapeutic exercise;Therapeutic activities;Functional mobility training;Gait training;DME instruction     PT  Goals(Current goals can be found in the care plan section) Acute Rehab PT Goals Patient Stated Goal:  back to old self PT Goal Formulation: With patient Time For Goal Achievement: 12/07/12 Potential to Achieve Goals: Good  Visit Information  Last PT Received On: 11/23/12 Assistance Needed: +1       Prior Functioning  Home Living Family/patient expects to be discharged to:: Private residence Living Arrangements: Alone Available Help at Discharge: Available PRN/intermittently;Family (per pt, son can stay 24 hours if needed) Type of Home: House Home Access: Level entry Home Layout: One level Home Equipment: None  Lives With: Alone Prior Function Level of Independence: Independent Comments: driving, cares for sister who just lost husband "I guess I'll have to stop doing that now"   Communication Communication: Expressive difficulties Dominant Hand: Right    Cognition  Cognition Arousal/Alertness: Awake/alert Behavior During Therapy: WFL for tasks assessed/performed;Flat affect Overall Cognitive Status: Within Functional Limits for tasks assessed    Extremity/Trunk Assessment Upper Extremity Assessment Upper Extremity Assessment: Defer to OT evaluation;RUE deficits/detail RUE Deficits / Details: finger to nose abnormal with 60-70% undershooting Lower Extremity Assessment Lower Extremity Assessment: Generalized weakness   Balance    End of Session PT - End of Session Equipment Utilized During Treatment: Gait belt Activity Tolerance: Patient limited by fatigue;Patient tolerated treatment well Patient left: in chair;with call bell/phone within reach Nurse Communication: Mobility status  GP     Dennis Bast 11/23/2012, 11:22 AM

## 2012-11-23 NOTE — Evaluation (Signed)
Speech Language Pathology Evaluation Patient Details Name: Jane Farley MRN: [id number] DOB: [birth year] Today's Date: 11/23/2012 Time: 9629-5284 SLP Time Calculation (min): 35 min  Problem List:  Patient Active Problem List   Diagnosis Date Noted  . CVA (cerebral infarction) 11/22/2012  . Aphasia 11/22/2012  . HTN (hypertension) 11/22/2012  . Hyperlipidemia 11/22/2012  . Nonspecific (abnormal) findings on radiological and other examination of gastrointestinal tract 10/03/2010  . Colon cancer screening 10/03/2010   Past Medical History:  Past Medical History  Diagnosis Date  . Hemorrhoids   . Nephrolithiasis   . Hypercholesterolemia   . Hypertension   . Osteoporosis   . Stroke     TIA   Past Surgical History:  Past Surgical History  Procedure Laterality Date  . Breast lumpectomy  02/23/1965    left  . Exploratory laparotomy with abdominal mass excision  12/08/2010   HPI:  Jane Farley is an 79 y.o. female with previous left thalamic stroke noted by CT on 10/11/12. Patient is a poor historian but states she felt sick last night and called her sister. Due to having dysarthric speech her sister brought her to hospital. Per note from Dr. Timothy Lasso patient recently suffered from a CVA in April and had a stroke work up "showing Multifocal regions of Iscemic changes in L Cerebellar hemisphere and L Occipital Lobe. Small additional lesions are present within the medial L Temporal Lobe and Upper Thalmus. New MRI shows Multiple foci of acute infarction are present in the posterior circulation. The largest area of infarction affects the left much greater than right mid to upper pons. Smaller multifocal areas of acute infarction are seen in the right and left cerebellar hemispheres, and left occipital cortex and white matter.   Assessment / Plan / Recommendation Clinical Impression  Pt demonstrates a mild dysarthria due to poor control of fine motor movement for articulation. Strength and  range of motion appear WNL. Deficits primarily heard at phrase or conversation level; pt speaks with low vocal intensity, deletes and misarticulates multisyllabic words and consonant clusters. With frequent verbal cues needed, pt is able to increase vocal intensity, slow her rate of speech and overarticulate for much more intelligible conversation. No aphasia observed. Pt does also exhibit higher level cognitive defictis in areas of memory and complex functional problem solving. Pt will need acute SLP f/u and may benefit from home health f/u depending on physical needs.     SLP Assessment  Patient needs continued Speech Lanaguage Pathology Services    Follow Up Recommendations  Home health SLP    Frequency and Duration min 2x/week  2 weeks   Pertinent Vitals/Pain NA   SLP Goals  SLP Goals Potential to Achieve Goals: Good Potential Considerations: Co-morbidities;Family/community support Progress/Goals/Alternative treatment plan discussed with pt/caregiver and they: Agree SLP Goal #1: Pt will increase vocal intensity, slow rate of speech and overarticulate at conversation level with supervision cues over 10 minutes.  SLP Goal #1 - Progress: Progressing toward goal SLP Goal #2: Pt will correctly complete complex functional problem solving task (medication management/finances) with supervision cues.  SLP Goal #2 - Progress: Progressing toward goal SLP Goal #3: Pt will verbalize new learning with min question cues x3.  SLP Goal #3 - Progress: Progressing toward goal  SLP Evaluation Prior Functioning  Cognitive/Linguistic Baseline: Within functional limits Type of Home: House  Lives With: Alone Available Help at Discharge: Available PRN/intermittently;Family Vocation: Retired (cares for sister with medical problems)   Cognition  Overall Cognitive Status: Impaired/Different  from baseline Arousal/Alertness: Awake/alert Orientation Level: Oriented X4 Attention:  Focused;Sustained;Selective;Alternating Focused Attention: Appears intact Sustained Attention: Appears intact Selective Attention: Appears intact Alternating Attention: Appears intact Memory: Impaired Memory Impairment: Retrieval deficit;Decreased short term memory Decreased Short Term Memory: Verbal complex Awareness: Appears intact Problem Solving: Impaired Problem Solving Impairment: Functional complex Executive Function: Reasoning;Sequencing;Organizing Reasoning: Impaired Reasoning Impairment: Verbal complex;Functional complex Sequencing: Impaired Sequencing Impairment: Functional complex Organizing: Impaired Organizing Impairment: Functional complex Safety/Judgment: Appears intact    Comprehension  Auditory Comprehension Overall Auditory Comprehension: Impaired Yes/No Questions: Within Functional Limits Commands: Within Functional Limits Conversation: Simple (needs some repetition with complex conversation) Visual Recognition/Discrimination Discrimination: Within Function Limits Reading Comprehension Reading Status: Within funtional limits    Expression Verbal Expression Overall Verbal Expression: Appears within functional limits for tasks assessed Initiation: No impairment Automatic Speech: Name;Social Response;Counting;Day of week Level of Generative/Spontaneous Verbalization: Conversation Repetition: No impairment Naming: No impairment Pragmatics: No impairment Interfering Components: Speech intelligibility Written Expression Dominant Hand: Right Written Expression: Within Functional Limits   Oral / Motor Oral Motor/Sensory Function Overall Oral Motor/Sensory Function: Impaired Labial ROM: Within Functional Limits Labial Symmetry: Within Functional Limits Labial Strength: Within Functional Limits Labial Sensation: Within Functional Limits Lingual ROM: Other (Comment) (slow) Lingual Symmetry: Within Functional Limits Lingual Strength: Within Functional  Limits Lingual Sensation: Within Functional Limits Facial ROM: Within Functional Limits Facial Symmetry: Within Functional Limits Facial Strength: Within Functional Limits Facial Sensation: Within Functional Limits Velum: Within Functional Limits Mandible: Within Functional Limits Motor Speech Overall Motor Speech: Impaired Respiration: Within functional limits Phonation: Low vocal intensity Resonance: Within functional limits Articulation: Impaired Level of Impairment: Word Intelligibility: Intelligibility reduced Word: 50-74% accurate Phrase: 50-74% accurate Sentence: 50-74% accurate Conversation: 0-24% accurate Motor Planning: Witnin functional limits Motor Speech Errors: Aware;Consistent Effective Techniques: Slow rate;Increased vocal intensity;Over-articulate   GO    Harlon Ditty, MA CCC-SLP [phone]  Claudine Mouton 11/23/2012, 8:29 AM

## 2012-11-23 NOTE — Evaluation (Signed)
Clinical/Bedside Swallow Evaluation Patient Details  Name: Jane Farley MRN: [id number] Date of Birth: December 21, 1932  Today's Date: 11/23/2012 Time: 7829-5621 SLP Time Calculation (min): 35 min  Past Medical History:  Past Medical History  Diagnosis Date  . Hemorrhoids   . Nephrolithiasis   . Hypercholesterolemia   . Hypertension   . Osteoporosis   . Stroke     TIA   Past Surgical History:  Past Surgical History  Procedure Laterality Date  . Breast lumpectomy  02/23/1965    left  . Exploratory laparotomy with abdominal mass excision  12/08/2010   HPI:  Jane Farley is an 79 y.o. female with previous left thalamic stroke noted by CT on 10/11/12. Patient is a poor historian but states she felt sick last night and called her sister. Due to having dysarthric speech her sister brought her to hospital. Per note from Dr. Timothy Lasso patient recently suffered from a CVA in April and had a stroke work up "showing Multifocal regions of Iscemic changes in L Cerebellar hemisphere and L Occipital Lobe. Small additional lesions are present within the medial L Temporal Lobe and Upper Thalmus. New MRI shows Multiple foci of acute infarction are present in the posterior circulation. The largest area of infarction affects the left much greater than right mid to upper pons. Smaller multifocal areas of acute infarction are seen in the right and left cerebellar hemispheres, and left occipital cortex and white matter.   Assessment / Plan / Recommendation Clinical Impression  Despite mild dysarthria pt demonstrates adequate oral and oropharyngeal function subjectively with no overt s/s of aspiration or oral dysphagia. Pt was observed over 30 minutes with self feeding of cracker and water with no oral residuals or anterior spillage. Recommend pt initate a regular texture diet and thin liquids. Do suggest no straws as a precaution. SLP will f/u for tolerance.     Aspiration Risk  Mild    Diet Recommendation  Regular;Thin liquid   Liquid Administration via: Cup;No straw Medication Administration: Whole meds with liquid Supervision: Patient able to self feed Compensations: Slow rate;Small sips/bites Postural Changes and/or Swallow Maneuvers: Seated upright 90 degrees    Other  Recommendations Oral Care Recommendations: Oral care BID   Follow Up Recommendations  Home health SLP    Frequency and Duration min 2x/week  2 weeks   Pertinent Vitals/Pain NA    SLP Swallow Goals Patient will consume recommended diet without observed clinical signs of aspiration with: Independent assistance Swallow Study Goal #1 - Progress: Progressing toward goal   Swallow Study Prior Functional Status       General HPI: Jane Farley is an 79 y.o. female with previous left thalamic stroke noted by CT on 10/11/12. Patient is a poor historian but states she felt sick last night and called her sister. Due to having dysarthric speech her sister brought her to hospital. Per note from Dr. Timothy Lasso patient recently suffered from a CVA in April and had a stroke work up "showing Multifocal regions of Iscemic changes in L Cerebellar hemisphere and L Occipital Lobe. Small additional lesions are present within the medial L Temporal Lobe and Upper Thalmus. New MRI shows Multiple foci of acute infarction are present in the posterior circulation. The largest area of infarction affects the left much greater than right mid to upper pons. Smaller multifocal areas of acute infarction are seen in the right and left cerebellar hemispheres, and left occipital cortex and white matter. Type of Study: Bedside swallow evaluation Diet  Prior to this Study: NPO Temperature Spikes Noted: No Respiratory Status: Room air History of Recent Intubation: No Behavior/Cognition: Alert;Cooperative;Pleasant mood Oral Cavity - Dentition: Adequate natural dentition Self-Feeding Abilities: Able to feed self Patient Positioning: Upright in bed Baseline  Vocal Quality: Clear Volitional Cough: Strong Volitional Swallow: Able to elicit    Oral/Motor/Sensory Function Overall Oral Motor/Sensory Function: Impaired Labial ROM: Within Functional Limits Labial Symmetry: Within Functional Limits Labial Strength: Within Functional Limits Labial Sensation: Within Functional Limits Lingual ROM: Other (Comment) (slow) Lingual Symmetry: Within Functional Limits Lingual Strength: Within Functional Limits Lingual Sensation: Within Functional Limits Facial ROM: Within Functional Limits Facial Symmetry: Within Functional Limits Facial Strength: Within Functional Limits Facial Sensation: Within Functional Limits Velum: Within Functional Limits Mandible: Within Functional Limits   Ice Chips Ice chips: Not tested   Thin Liquid Thin Liquid: Within functional limits Presentation: Cup;Straw;Self Fed    Nectar Thick Nectar Thick Liquid: Not tested   Honey Thick Honey Thick Liquid: Not tested   Puree Puree: Within functional limits Presentation: Self Fed;Spoon   Solid   GO    Solid: Within functional limits Presentation: Self Fed      Harlon Ditty, MA CCC-SLP [phone]   Wille Aubuchon, Riley Nearing 11/23/2012,8:17 AM

## 2012-11-23 NOTE — Progress Notes (Signed)
Pt Jane Farley in bed, breakfast order for Pt by SLP. No complaints of pain or SOB. Pt has NeuroVAs check q4, left note for MD if it was to be Neuro chks q4. Will continue to monitor Pt

## 2012-11-23 NOTE — Consult Note (Signed)
Physical Medicine and Rehabilitation Consult   Reason for Consult:  Right facial droop with dysarthric speech and right sided weakness. Referring Physician:  Dr. Wylene Simmer   HPI: Jane Farley is a 79 y.o. RH-female with history of L occipital/thalamic CVA 4/14 with left visual field loss, recent confusion episodes with initiation of plavix in August; who was admitted on 11/22/12 with right facial droop, slurred speech as well as right sided weakness. MRI/MRA of brain done revealing multifocal areas of acute infarction within the posterior circulation with most significant involvement in left greater than right mid to upper pons and severe irregularity of distal R-VA. 2D echo with EF 55-60% and no wall abnormality.  Carotid dopplers without significant ICA stenosis. Neurology recommends low dose ASA and plavix for secondary stroke prevention.  Work up ongoing for questionable embolic etiology. ST evaluation done and regular textures recommended.  PT evaluation done and patient limited by vestibular symptoms with ataxic gait as well as anxiety. MD, PT recommending CIR.   Review of Systems  HENT: Negative for hearing loss and neck pain.   Eyes: Positive for double vision.  Respiratory: Negative for cough, shortness of breath and wheezing.   Cardiovascular: Negative for chest pain and palpitations.  Gastrointestinal: Negative for heartburn, abdominal pain and constipation.  Genitourinary: Negative for urgency and frequency.  Musculoskeletal: Negative for back pain and joint pain.  Neurological: Positive for dizziness, speech change and focal weakness. Negative for headaches.  Psychiatric/Behavioral: The patient has insomnia.     Past Medical History  Diagnosis Date  . Hemorrhoids   . Nephrolithiasis   . Hypercholesterolemia   . Hypertension   . Osteoporosis   . Stroke     TIA   Past Surgical History  Procedure Laterality Date  . Breast lumpectomy  02/23/1965    left  . Exploratory  laparotomy with abdominal mass excision  12/08/2010   Family History  Problem Relation Age of Onset  . Diabetes Father   . Diabetes Brother    Social History:  Widowed. Lives alone --independent PTA. 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  Still works as a fill in. She reports that she quit smoking about 10 years ago. She has never used smokeless tobacco. She reports that she does not drink alcohol or use illicit drugs.  Allergies: No Known Allergies  Medications Prior to Admission  Medication Sig Dispense Refill  . calcium carbonate (OS-CAL) 600 MG TABS Take 600 mg by mouth daily.       . clopidogrel (PLAVIX) 75 MG tablet Take 1 tablet (75 mg total) by mouth daily.  30 tablet  0  . losartan (COZAAR) 100 MG tablet Take 100 mg by mouth daily.        Marland Kitchen OVER THE COUNTER MEDICATION Take 15 mLs by mouth daily as needed (sleep).      . simvastatin (ZOCOR) 20 MG tablet Take 20 mg by mouth at bedtime.        . Zoledronic Acid (RECLAST IV) Inject into the vein. Once a year         Home: Home Living Family/patient expects to be discharged to:: Private residence Living Arrangements: Alone Available Help at Discharge: Available PRN/intermittently;Family (per pt, son can stay 24 hours if needed) Type of Home: House Home Access: Level entry Home Layout: One level Home Equipment: None  Lives With: Alone  Functional History: Prior Function Vocation: Retired (cares for sister with medical problems) Comments: driving, cares for sister who just lost husband "I  guess I'll have to stop doing that now"   Functional Status:  Mobility: Bed Mobility Bed Mobility: Supine to Sit;Sitting - Scoot to Edge of Bed Supine to Sit: 4: Min assist;HOB elevated;With rails Sitting - Scoot to Edge of Bed: 4: Min assist;With rail Transfers Transfers: Sit to Stand;Stand to Sit Sit to Stand: 4: Min assist;From elevated surface;With upper extremity assist;From bed;From toilet Stand to Sit: 4: Min assist;With  upper extremity assist;To chair/3-in-1 Ambulation/Gait Ambulation/Gait Assistance: 3: Mod assist Ambulation Distance (Feet): 30 Feet Assistive device: Rolling walker;1 person hand held assist Ambulation/Gait Assistance Details: bed to bathroom 1 hand assist and mod assist for stability, pt reaching for external support entire distance (15 feet) and fearful.  Improved from toilet to chair with RW and increased stability needing close supervision to occassional hands on for security and verbal cues to navigate around doorway to find recliner in room Gait Pattern: Step-through pattern;Ataxic;Trunk flexed;Narrow base of support Gait velocity: decr, unable to measure in cramped space in room General Gait Details: slow, apprehensive, unsteady without external support and far from baseline of independent without device Stairs: No Wheelchair Mobility Wheelchair Mobility: No  ADL:    Cognition: Cognition Overall Cognitive Status: Within Functional Limits for tasks assessed Arousal/Alertness: Awake/alert Orientation Level: Oriented X4 Attention: Focused;Sustained;Selective;Alternating Focused Attention: Appears intact Sustained Attention: Appears intact Selective Attention: Appears intact Alternating Attention: Appears intact Memory: Impaired Memory Impairment: Retrieval deficit;Decreased short term memory Decreased Short Term Memory: Verbal complex Awareness: Appears intact Problem Solving: Impaired Problem Solving Impairment: Functional complex Executive Function: Reasoning;Sequencing;Organizing Reasoning: Impaired Reasoning Impairment: Verbal complex;Functional complex Sequencing: Impaired Sequencing Impairment: Functional complex Organizing: Impaired Organizing Impairment: Functional complex Safety/Judgment: Appears intact Cognition Arousal/Alertness: Awake/alert Behavior During Therapy: WFL for tasks assessed/performed;Flat affect Overall Cognitive Status: Within Functional  Limits for tasks assessed  Blood pressure 138/65, pulse 67, temperature 98.7 F (37.1 C), temperature source Oral, resp. rate 18, height 5\' 2"  (1.575 m), weight 58.514 kg (129 lb), SpO2 98.00%.   Physical Exam  Nursing note and vitals reviewed. Constitutional: She is oriented to person, place, and time. She appears well-developed and well-nourished.  HENT:  Head: Normocephalic and atraumatic.  Eyes: Conjunctivae are normal. Pupils are equal, round, and reactive to light.  Nystagmus to left lateral field.   Neck: Normal range of motion. Neck supple.  Cardiovascular: Normal rate and regular rhythm.   No murmur heard. Pulmonary/Chest: Effort normal and breath sounds normal. No respiratory distress. She has no wheezes.  Abdominal: Soft. Bowel sounds are normal. She exhibits no distension. There is no tenderness.  Musculoskeletal: She exhibits no edema and no tenderness.  Neurological: She is alert and oriented to person, place, and time.  Moderate dysarthria. Follows commands without difficulty.  Decreased in fine motor movements on right with right pronator drift. Mild right facial droop. Motor 4/5 RUE and RLE. Left 4+ to 5/5. Fair insight and awareness.   Skin: Skin is warm and dry.  Multiple keratosis BUE.   Psychiatric: She has a normal mood and affect. Her behavior is normal.    Results for orders placed during the hospital encounter of 11/22/12 (from the past 24 hour(s))  COMPREHENSIVE METABOLIC PANEL     Status: Abnormal   Collection Time    11/23/12  6:00 AM      Result Value Range   Sodium 139  135 - 145 mEq/L   Potassium 3.5  3.5 - 5.1 mEq/L   Chloride 104  96 - 112 mEq/L   CO2 25  19 -  32 mEq/L   Glucose, Bld 89  70 - 99 mg/dL   BUN 10  6 - 23 mg/dL   Creatinine, Ser 4.09  0.50 - 1.10 mg/dL   Calcium 8.5  8.4 - 81.1 mg/dL   Total Protein 6.4  6.0 - 8.3 g/dL   Albumin 3.3 (*) 3.5 - 5.2 g/dL   AST 11  0 - 37 U/L   ALT 7  0 - 35 U/L   Alkaline Phosphatase 47  39 - 117  U/L   Total Bilirubin 0.5  0.3 - 1.2 mg/dL   GFR calc non Af Amer 82 (*) >90 mL/min   GFR calc Af Amer >90  >90 mL/min  CBC     Status: Abnormal   Collection Time    11/23/12  6:00 AM      Result Value Range   WBC 5.6  4.0 - 10.5 K/uL   RBC 3.89  3.87 - 5.11 MIL/uL   Hemoglobin 11.9 (*) 12.0 - 15.0 g/dL   HCT 91.4 (*) 78.2 - 95.6 %   MCV 87.9  78.0 - 100.0 fL   MCH 30.6  26.0 - 34.0 pg   MCHC 34.8  30.0 - 36.0 g/dL   RDW 21.3  08.6 - 57.8 %   Platelets 224  150 - 400 K/uL   Mr [facility] Contrast  11/22/2012   CLINICAL DATA:  Nausea. Speech difficulty.  Evaluate for stroke.  EXAM: MRI HEAD WITHOUT CONTRAST  MRA HEAD WITHOUT CONTRAST  TECHNIQUE: Multiplanar, multiecho pulse sequences of the brain and surrounding structures were obtained without intravenous contrast. Angiographic images of the head were obtained using MRA technique without contrast.  COMPARISON:  CT head 10/19/2012 and 10/11/2012.  FINDINGS: MRI HEAD FINDINGS  Multiple foci of acute infarction are present in the posterior circulation. The largest area of infarction affects the left much greater than right mid to upper pons. Smaller multifocal areas of acute infarction are seen in the right and left cerebellar hemispheres, and left occipital cortex and white matter. There is no associated acute or chronic hemorrhage. Flow voids are maintained.  Moderate atrophy is present. Chronic microvascular ischemic change affects the periventricular and subcortical white matter. Prominent perivascular spaces suggests longstanding hypertension. Remote left basal ganglia and left periventricular white matter lacunar infarcts. No chronic large vessel infarct.  Scalp and extracranial soft tissues unremarkable. Negative osseous structures. Bilateral cataract extraction. Chronic sinus disease with moderate retention cyst left maxillary region. No acute mastoid fluid.  Compared with prior CT, these findings are not visible.  MRA HEAD FINDINGS  The  internal carotid arteries are patent throughout their upper cervical, skull base, and cavernous segments. There is mild non-stenotic irregularity of the supraclinoid ICA vessels. ICA termini are widely patent. Mild irregularity both proximal anterior cerebral arteries. Mild irregularity of both proximal middle cerebral arteries. Fetal origin right PCA with mild irregularity of both proximal posterior cerebral vessels. No intracranial aneurysm.  There is moderate non stenotic irregularity of the proximal basilar artery (arrows) with the left vertebral as the dominant contributor. There is severe disease of the much smaller distal right vertebral with multiple tandem high-grade stenoses in its distal V4 segment. No stenosis or irregularity of the distal left vertebral. No flow-limiting stenosis of the basilar.  IMPRESSION: MRI HEAD IMPRESSION  Multifocal areas of acute infarction within the posterior circulation. The area of most significant involvement includes the left greater than right mid to upper pons. Atrophy and chronic microvascular ischemic change. Findings  discussed with ordering provider.  MRA HEAD IMPRESSION  Mild non stenotic irregularity of the proximal basilar artery and both supraclinoid internal carotid arteries. No flow-limiting basilar stenosis. Severe irregularity distal right vertebral.   Electronically Signed   By: Davonna Belling M.D.   On: 11/22/2012 13:43    Assessment/Plan: Diagnosis: ?embolic posterior circulation infarcts 1. Does the need for close, 24 hr/day medical supervision in concert with the patient's rehab needs make it unreasonable for this patient to be served in a less intensive setting? Yes 2. Co-Morbidities requiring supervision/potential complications: htn, 3. Due to bladder management, bowel management, safety, skin/wound care, disease management, medication administration and patient education, does the patient require 24 hr/day rehab nursing? Yes 4. Does the patient  require coordinated care of a physician, rehab nurse, PT (1-2 hrs/day, 5 days/week), OT (1-2 hrs/day, 5 days/week) and SLP (1-2 hrs/day, 5 days/week) to address physical and functional deficits in the context of the above medical diagnosis(es)? Yes Addressing deficits in the following areas: balance, endurance, locomotion, strength, transferring, bowel/bladder control, bathing, dressing, feeding, grooming, toileting, speech and psychosocial support 5. Can the patient actively participate in an intensive therapy program of at least 3 hrs of therapy per day at least 5 days per week? Yes 6. The potential for patient to make measurable gains while on inpatient rehab is good 7. Anticipated functional outcomes upon discharge from inpatient rehab are supervision to mod I with PT, supervision  with OT, mod I with SLP. 8. Estimated rehab length of stay to reach the above functional goals is: 2 weeks 9. Does the patient have adequate social supports to accommodate these discharge functional goals? Yes and Potentially 10. Anticipated D/C setting: Home 11. Anticipated post D/C treatments: HH therapy 12. Overall Rehab/Functional Prognosis: excellent  RECOMMENDATIONS: This patient's condition is appropriate for continued rehabilitative care in the following setting: CIR Patient has agreed to participate in recommended program. Yes Note that insurance prior authorization may be required for reimbursement for recommended care.  Comment: Rehab RN to follow up re social supports, etc   Ranelle Oyster, MD, [place] [facility]     11/23/2012

## 2012-11-23 NOTE — Progress Notes (Signed)
INITIAL NUTRITION ASSESSMENT  DOCUMENTATION CODES Per approved criteria  -Not Applicable   INTERVENTION:  Ensure Complete twice daily PRN (350 kcals, 13 gm protein per 8 fl oz bottle) RD to follow for nutrition care plan  NUTRITION DIAGNOSIS: Predicted suboptimal nutrient intake related to CVA as evidenced by family report   Goal: Pt to meet >/= 90% of their estimated nutrition needs   Monitor:  PO & supplemental intake, weight, labs, I/O's  Reason for Assessment: Malnutrition Screening Tool Report  79 y.o. female  Admitting Dx: CVA (cerebral infarction)  ASSESSMENT: Patient with PMH of HTN, previous CVA and osteoporosis presented to ED after waking up and having difficulty with word finding; MRI confirmed acute posterior CVA.  RD spoke with patient's son via telephone; reports patient has "never really been a big eater;" PTA patient was preparing her own meals; no recent weight loss per readings below; s/p language & bedside swallow evaluations; given would benefit from addition of nutrition supplements on as needed basis ---> RD to order.  Height: Ht Readings from Last 1 Encounters:  11/22/12 5\' 2"  (1.575 m)    Weight: Wt Readings from Last 1 Encounters:  11/22/12 129 lb (58.514 kg)    Ideal Body Weight: 110 lb  % Ideal Body Weight: 117%  Wt Readings from Last 10 Encounters:  11/22/12 129 lb (58.514 kg)  10/19/12 125 lb (56.7 kg)  12/17/10 129 lb (58.514 kg)  10/29/10 131 lb 3.2 oz (59.512 kg)  10/03/10 128 lb (58.06 kg)    Usual Body Weight: 125 lb  % Usual Body Weight: 103%  BMI:  Body mass index is 23.59 kg/(m^2).  Estimated Nutritional Needs: Kcal: 1500-1700 Protein: 70-80 gm Fluid: 1.5-1.7 L  Skin: Intact  Diet Order: Cardiac  EDUCATION NEEDS: -No education needs identified at this time  Labs:   Recent Labs Lab 11/22/12 1030 11/23/12 0600  NA 139 139  K 4.0 3.5  CL 105 104  CO2 24 25  BUN 10 10  CREATININE 0.65 0.69  CALCIUM  8.9 8.5  GLUCOSE 107* 89    CBG (last 3)   Recent Labs  11/22/12 1027  GLUCAP 115*    Scheduled Meds: . aspirin EC  81 mg Oral Daily  . calcium carbonate  1 tablet Oral Daily  . clopidogrel  75 mg Oral Daily  . enoxaparin (LOVENOX) injection  40 mg Subcutaneous Q24H  . losartan  100 mg Oral Daily  . simvastatin  20 mg Oral QHS  . sodium chloride  3 mL Intravenous Q12H    Continuous Infusions:   Past Medical History  Diagnosis Date  . Hemorrhoids   . Nephrolithiasis   . Hypercholesterolemia   . Hypertension   . Osteoporosis   . Stroke     TIA    Past Surgical History  Procedure Laterality Date  . Breast lumpectomy  02/23/1965    left  . Exploratory laparotomy with abdominal mass excision  12/08/2010    Maureen Chatters, RD, LDN Pager #: [phone] After-Hours Pager #: [phone]

## 2012-11-23 NOTE — Clinical Social Work Note (Signed)
CSW received consult for SNF placement. At this time pt does not have PT/OT recommendations. CSW signing off. ° °Please reconsult CSW if needed. ° °Emily [place], MSW, LCSWA °Clinical Social Work °[phone] °

## 2012-11-23 NOTE — Progress Notes (Signed)
   CARE MANAGEMENT NOTE 11/23/2012  Patient:  Jane Farley, Jane Farley   Account Number:  [id number]  Date Initiated:  11/23/2012  Documentation initiated by:  Jiles Crocker  Subjective/Objective Assessment:   ADMITTED FOR STROKE     Action/Plan:   PCP:  Gaspar Garbe, MD  LIVES ALONE; CM FOLLOWING FOR DCP   Anticipated DC Date:  11/30/2012   Anticipated DC Plan:  HOME W HOME HEALTH SERVICES VS SHORT TERM SNF; AWAITING ON PT/OT EVALS FOR DISPOSITION    DC Planning Services  CM consult          Status of service:  In process, will continue to follow Medicare Important Message given?  NA - LOS <3 / Initial given by admissions (If response is "NO", the following Medicare IM given date fields will be blank)  Per UR Regulation:  Reviewed for med. necessity/level of care/duration of stay  Comments:  11/23/2012- B Sachit Gilman RN,BSN,MHA

## 2012-11-24 ENCOUNTER — Encounter (HOSPITAL_COMMUNITY): Payer: Self-pay | Admitting: Cardiology

## 2012-11-24 DIAGNOSIS — I10 Essential (primary) hypertension: Secondary | ICD-10-CM

## 2012-11-24 DIAGNOSIS — I69920 Aphasia following unspecified cerebrovascular disease: Secondary | ICD-10-CM | POA: Diagnosis not present

## 2012-11-24 DIAGNOSIS — E785 Hyperlipidemia, unspecified: Secondary | ICD-10-CM

## 2012-11-24 DIAGNOSIS — I6350 Cerebral infarction due to unspecified occlusion or stenosis of unspecified cerebral artery: Secondary | ICD-10-CM | POA: Diagnosis not present

## 2012-11-24 DIAGNOSIS — I679 Cerebrovascular disease, unspecified: Secondary | ICD-10-CM | POA: Diagnosis not present

## 2012-11-24 DIAGNOSIS — G8190 Hemiplegia, unspecified affecting unspecified side: Secondary | ICD-10-CM | POA: Diagnosis not present

## 2012-11-24 DIAGNOSIS — I6340 Cerebral infarction due to embolism of unspecified cerebral artery: Secondary | ICD-10-CM | POA: Diagnosis not present

## 2012-11-24 LAB — LIPID PANEL
Cholesterol: 150 mg/dL (ref 0–200)
HDL: 75 mg/dL (ref >39)
LDL Cholesterol: 23 mg/dL (ref 0–99)
Total CHOL/HDL Ratio: 2 RATIO
VLDL: 52 mg/dL — ABNORMAL HIGH (ref 0–40)

## 2012-11-24 MED ORDER — CHLORHEXIDINE GLUCONATE 4 % EX LIQD
60.0000 mL | Freq: Once | CUTANEOUS | Status: DC
  Filled 2012-11-24: qty 60

## 2012-11-24 MED ORDER — SODIUM CHLORIDE 0.9 % IV SOLN: INTRAVENOUS | Status: DC

## 2012-11-24 MED ORDER — CEFAZOLIN SODIUM-DEXTROSE 2-3 GM-% IV SOLR: 2.0000 g | INTRAVENOUS | Status: DC

## 2012-11-24 NOTE — Progress Notes (Addendum)
I met with patient at bedside. She lives alone and son works. Inpt rehab bed is not immediately available. I will discuss with RN CM and SW. Recommend pursue SNF and pt is in agreement . Patient will need prolonged rehab to return home with intermittent assistance. [phone]

## 2012-11-24 NOTE — Clinical Social Work Placement (Signed)
Clinical Social Work Department CLINICAL SOCIAL WORK PLACEMENT NOTE 11/24/2012  Patient:  Jane Farley, Jane Farley  Account Number:  [id number] Admit date:  11/22/2012  Clinical Social Worker:  Irving Burton [place], LCSWA  Date/time:  11/24/2012 09:32 PM  Clinical Social Work is seeking post-discharge placement for this patient at the following level of care:   SKILLED NURSING   (*CSW will update this form in Epic as items are completed)   11/24/2012  Patient/family provided with [facility] Department of Clinical Social Works list of facilities offering this level of care within the geographic area requested by the patient (or if unable, by the patients family).  11/24/2012  Patient/family informed of their freedom to choose among providers that offer the needed level of care, that participate in Medicare, Medicaid or managed care program needed by the patient, have an available bed and are willing to accept the patient.  11/24/2012  Patient/family informed of [facility] ownership interest in [facility], as well as of the fact that they are under no obligation to receive care at this facility.  PASARR submitted to EDS on  PASARR number received from EDS on   FL2 transmitted to all facilities in geographic area requested by pt/family on  11/24/2012 FL2 transmitted to all facilities within larger geographic area on   Patient informed that his/her managed care company has contracts with or will negotiate with  certain facilities, including the following:     Patient/family informed of bed offers received:   Patient chooses bed at  Physician recommends and patient chooses bed at    Patient to be transferred to  on   Patient to be transferred to facility by   The following physician request were entered in Epic:   Additional Comments:  Darlyn Chamber, MSW, LCSWA Clinical Social Work [phone]

## 2012-11-24 NOTE — Consult Note (Signed)
ELECTROPHYSIOLOGY CONSULT NOTE  Patient ID: Jane Farley MRN: [id number], DOB/AGE: 1932-12-21   Admit date: 11/22/2012 Date of Consult: 11/24/2012  Primary Physician: Wylene Simmer, MD Primary Cardiologist: None Reason for Consultation: Cryptogenic stroke; recommendations regarding Implantable Loop Recorder  History of Present Illness Jane Farley was admitted on 11/22/2012 with acute CVA. She has been monitored on telemetry which has demonstrated no arrhythmias. No cause has been identified. Inpatient stroke work-up is to be completed with a TEE. EP has been asked to evaluate for placement of an implantable loop recorder to monitor for atrial fibrillation.  Past Medical History Past Medical History  Diagnosis Date  . Hemorrhoids   . Nephrolithiasis   . Hypercholesterolemia   . Hypertension   . Osteoporosis   . Stroke     Past Surgical History Past Surgical History  Procedure Date  . Breast lumpectomy 02/23/1965  . Exploratory laparotomy with abdominal mass excision 12/08/2010    Allergies/Intolerances No Known Allergies  Inpatient Medications . aspirin EC  81 mg Oral Daily  . calcium carbonate  1 tablet Oral Daily  . clopidogrel  75 mg Oral Daily  . enoxaparin (LOVENOX) injection  40 mg Subcutaneous Q24H  . losartan  100 mg Oral Daily  . simvastatin  20 mg Oral QHS  . sodium chloride  3 mL Intravenous Q12H    Social History Social History  . Marital Status: Widowed   Social History Main Topics  . Smoking status: Former Smoker    Quit date: 03/04/2002  . Smokeless tobacco: Never Used  . Alcohol Use: No  . Drug Use: No   Review of Systems General: No chills, fever, night sweats or weight changes  Cardiovascular:  No chest pain, dyspnea on exertion, edema, orthopnea, palpitations, paroxysmal nocturnal dyspnea Dermatological: No rash, lesions or masses Respiratory: No cough, dyspnea Urologic: No hematuria, dysuria Abdominal: No nausea, vomiting, diarrhea, bright  red blood per rectum, melena, or hematemesis Neurologic: No visual changes, weakness, changes in mental status All other systems reviewed and are otherwise negative except as noted above.  Physical Exam Blood pressure 116/57, pulse 63, temperature 98.2 F (36.8 C), temperature source Oral, resp. rate 18, height 5\' 2"  (1.575 m), weight 129 lb (58.514 kg), SpO2 94.00%.  General: Well developed, well appearing 79 y.o. female in no acute distress. HEENT: Normocephalic, atraumatic. EOMs intact. Sclera nonicteric. Oropharynx clear.  Neck: Supple. No JVD. Lungs: Respirations regular and unlabored, CTA bilaterally. No wheezes, rales or rhonchi. Heart: RRR. S1, S2 present. No murmurs, rub, S3 or S4. Abdomen: Soft, non-tender, non-distended. BS present x 4 quadrants. No hepatosplenomegaly.  Extremities: No clubbing, cyanosis or edema. DP/PT/Radials 2+ and equal bilaterally. Psych: Normal affect. Neuro: Alert and oriented X 3. Moves all extremities spontaneously. Musculoskeletal: No kyphosis. Skin: Intact. Warm and dry. No rashes or petechiae in exposed areas.   Labs Lab Results  Component Value Date   WBC 5.6 11/23/2012   HGB 11.9* 11/23/2012   HCT 34.2* 11/23/2012   MCV 87.9 11/23/2012   PLT 224 11/23/2012     Recent Labs Lab 11/23/12 0600  NA 139  K 3.5  CL 104  CO2 25  BUN 10  CREATININE 0.69  CALCIUM 8.5  PROT 6.4  BILITOT 0.5  ALKPHOS 47  ALT 7  AST 11  GLUCOSE 89    Recent Labs  11/22/12 1217  INR 0.94    Radiology/Studies Ct Angio Head W/cm &/or Wo Cm 11/23/2012   IMPRESSION: CTA HEAD IMPRESSION  Mild irregularity  of the proximal basilar without flow limiting stenosis or dissection.  Mild non stenotic irregularity both carotid siphons without flow limiting intracranial stenosis of the anterior circulation. CTA NECK IMPRESSION  Heavily calcified but non stenotic bilateral carotid bifurcations. No evidence for carotid fibromuscular dysplasia or dissection.  Left vertebral  is the dominant contributor to the posterior circulation and is free of significant disease.  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   Electronically Signed   By: Davonna Belling M.D.   On: 11/23/2012 19:31   Ct Angio Neck W/cm &/or Wo/cm 11/23/2012   IMPRESSION: CTA HEAD IMPRESSION  Mild irregularity of the proximal basilar without flow limiting stenosis or dissection.  Mild non stenotic irregularity both carotid siphons without flow limiting intracranial stenosis of the anterior circulation. CTA NECK IMPRESSION  Heavily calcified but non stenotic bilateral carotid bifurcations. No evidence for carotid fibromuscular dysplasia or dissection.  Left vertebral is the dominant contributor to the posterior circulation and is free of significant disease.  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    Electronically Signed   By: Davonna Belling M.D.   On: 11/23/2012 19:31   [name] Wo Contrast 11/22/2012      IMPRESSION: MRI HEAD IMPRESSION  Multifocal areas of acute infarction within the posterior circulation. The area of most significant involvement includes the left greater than right mid to upper pons. Atrophy and chronic microvascular ischemic change. Findings discussed with ordering provider.  MRA HEAD IMPRESSION  Mild non stenotic irregularity of the proximal basilar artery and both supraclinoid internal carotid arteries. No flow-limiting basilar stenosis. Severe irregularity distal right vertebral.    Electronically Signed   By: Davonna Belling M.D.   On: 11/22/2012 13:43   Echocardiogram  Study Conclusions - Left ventricle: The cavity size was normal. Wall thickness was increased in a pattern of mild LVH. Systolic function was normal. The estimated ejection fraction was in the range of 55% to 60%. Wall motion was normal; there were no regional wall motion abnormalities. - Aortic valve: Calcified non coronary  cusp - Left atrium: The atrium was mildly dilated. - Atrial septum: No defect or patent foramen ovale was identified.  12-lead ECG on admission shows SR Telemetry shows SR with rare PACs   Assessment and Plan 1. Cryptogenic stroke  If the TEE is negative, we recommend loop recorder insertion to monitor for AF. The indication for loop recorder insertion / monitoring for AF in setting of cryptogenic stroke was discussed with the patient. The loop recorder insertion procedure was reviewed in detail including risks and benefits. These risks include but are not limited to bleeding and infection. The patient expressed verbal understanding and agrees to proceed. The patient was also counseled regarding wound care and device follow-up.  Dr. Johney Frame to see Signed, Rick Duff 11/24/2012, 3:41 PM    I have seen, examined the patient, and reviewed the above assessment and plan.  Changes to above are made where necessary.  Assessment and Plan:  1. Cryptogenic stroke The patient presents with cryptogenic stroke.  The patient has a TEE planned for this AM.  I spoke at length with the patient about monitoring for afib with an implantable loop recorder.  Risks, benefits, and alteratives to implantable loop recorder were discussed with the patient today.   At this time, the patient is very clear in their decision to proceed with implantable loop recorder if TEE is negative in the am..  Please call with questions.   Co Sign: Hillis Range, MD 11/24/2012 6:49 PM

## 2012-11-24 NOTE — Progress Notes (Signed)
Stroke Team Progress Note  HISTORY Jane Farley is an 79 y.o. female with previous left thalamic stroke noted by CT on 10/11/12. Patient is a poor historian but states she felt sick last night and called her sister. Due to having dysarthric speech her sister brought her to hospital. Per note from Dr. Timothy Lasso patient recently suffered from a CVA in April and had a stroke work up "showing Multifocal regions of Iscemic changes in L Cerebellar hemisphere and L Occipital Lobe. Small additional lesions are present within the medial L Temporal Lobe and Upper Thalmus. Moderately advanced chronic microvascular disease. Maxillary Retention cyst. Negative MRA of Carotids. Developmental varients of the circle of Willis without stenosis. Carotid Dopplers = Multifocal plaque L>R carotids with < 60% stenosis B."  Patient states she recently was placed on Plavix one week ago. Currently she is showing a right facial droop, severely dysarthric, showing right arm and legs weakness.   Date last known well: Date: 11/21/2012  Time last known well: Time: 21:00  tPA Given: No: out of window  She was admitted  for further evaluation and treatment.  SUBJECTIVE Her family is at the bedside.  Overall she feels her condition is stable. No new neurological symptoms  OBJECTIVE Most recent Vital Signs: Filed Vitals:   11/24/12 0216 11/24/12 0618 11/24/12 1032 11/24/12 1417  BP: 129/79 110/81 130/85 116/57  Pulse:  62 61 63  Temp: 98.3 F (36.8 C) 98.3 F (36.8 C) 98.4 F (36.9 C) 98.2 F (36.8 C)  TempSrc: Oral Oral Oral Oral  Resp: 20 20 19 18   Height:      Weight:      SpO2: 96% 95% 99% 94%   CBG (last 3)   Recent Labs  11/22/12 1027  GLUCAP 115*    IV Fluid Intake:     MEDICATIONS  . aspirin EC  81 mg Oral Daily  . calcium carbonate  1 tablet Oral Daily  . clopidogrel  75 mg Oral Daily  . enoxaparin (LOVENOX) injection  40 mg Subcutaneous Q24H  . losartan  100 mg Oral Daily  . simvastatin  20 mg  Oral QHS  . sodium chloride  3 mL Intravenous Q12H   PRN:  sodium chloride, acetaminophen, acetaminophen, feeding supplement, sodium chloride  Diet:  Cardiac thin liquids Activity:  Up with assistance DVT Prophylaxis:  Lovenox, SCD  CLINICALLY SIGNIFICANT STUDIES Basic Metabolic Panel:   Recent Labs Lab 11/22/12 1030 11/23/12 0600  NA 139 139  K 4.0 3.5  CL 105 104  CO2 24 25  GLUCOSE 107* 89  BUN 10 10  CREATININE 0.65 0.69  CALCIUM 8.9 8.5   Liver Function Tests:   Recent Labs Lab 11/22/12 1030 11/23/12 0600  AST 12 11  ALT 8 7  ALKPHOS 46 47  BILITOT 0.4 0.5  PROT 6.8 6.4  ALBUMIN 3.7 3.3*   CBC:   Recent Labs Lab 11/22/12 1030 11/23/12 0600  WBC 6.2 5.6  NEUTROABS 4.6  --   HGB 12.2 11.9*  HCT 35.3* 34.2*  MCV 87.8 87.9  PLT 253 224   Coagulation:   Recent Labs Lab 11/22/12 1217  LABPROT 12.4  INR 0.94   Cardiac Enzymes: No results found for this basename: CKTOTAL, CKMB, CKMBINDEX, TROPONINI,  in the last 168 hours Urinalysis:   Recent Labs Lab 11/22/12 1105  COLORURINE YELLOW  LABSPEC 1.009  PHURINE 6.5  GLUCOSEU NEGATIVE  HGBUR MODERATE*  BILIRUBINUR NEGATIVE  KETONESUR NEGATIVE  PROTEINUR NEGATIVE  UROBILINOGEN 0.2  NITRITE NEGATIVE  LEUKOCYTESUR NEGATIVE   Lipid Panel    Component Value Date/Time   CHOL 150 11/24/2012 0715   LDL 23  HgbA1C  Lab Results  Component Value Date   HGBA1C 6.0* 11/23/2012    Urine Drug Screen:     Component Value Date/Time   LABOPIA NONE DETECTED 11/22/2012 1105   COCAINSCRNUR NONE DETECTED 11/22/2012 1105   LABBENZ NONE DETECTED 11/22/2012 1105   AMPHETMU NONE DETECTED 11/22/2012 1105   THCU NONE DETECTED 11/22/2012 1105   LABBARB NONE DETECTED 11/22/2012 1105    Alcohol Level:   Recent Labs Lab 11/22/12 1217  ETH <11    [name] Wo Contrast 11/22/2012   Mild non stenotic irregularity of the proximal basilar artery and both supraclinoid internal carotid arteries. No flow-limiting  basilar stenosis. Severe irregularity distal right vertebral.     Mr Brain Wo Contrast 11/22/2012   Multifocal areas of acute infarction within the posterior circulation. The area of most significant involvement includes the left greater than right mid to upper pons. Atrophy and chronic microvascular ischemic change. Findings discussed with ordering provider.  CTA Head Mild irregularity of the proximal basilar without flow limiting  stenosis or dissection.  Mild non stenotic irregularity both carotid siphons without flow  limiting intracranial stenosis of the anterior circulation.   CTA Neck Heavily calcified but non stenotic bilateral carotid bifurcations.  No evidence for carotid fibromuscular dysplasia or dissection.  Left vertebral is the dominant contributor to the posterior  circulation and is free of significant disease.  The right vertebral is small throughout its course, likely  congenitally hypoplastic.   CT of the brain    TCD  2D Echocardiogram  EF 60%, wall motion normal, No ASD or PFO.  Carotid Doppler  Bilateral: 1-39% ICA stenosis. Vertebral artery flow is antegrade  CXR    EKG  normal sinus rhythm.   Therapy Recommendations CIR  Physical Exam    Mental Status:  Alert, oriented, speech dysarthric without evidence of aphasia. Able to follow 3 step commands without difficulty.  Cranial Nerves:  II: Discs flat bilaterally; Visual fields grossly normal, pupils equal, round, reactive to light and accommodation  III,IV, VI: ptosis not present, extra-ocular motions intact bilaterally  V,VII: smile asymmetric on the right, facial light touch sensation normal bilaterally  VIII: hearing normal bilaterally  IX,X: gag reflex present  XI: bilateral shoulder shrug  XII: midline tongue extension  Motor:  Right : Upper extremity 4/5 Left: Upper extremity 5/5  Lower extremity 4/5 Lower extremity 5/5  --Drift on the right UE and LE  Tone and bulk:normal tone  throughout; no atrophy noted  Sensory: Pinprick and light touch intact throughout, bilaterally  Deep Tendon Reflexes:  Right: Upper Extremity Left: Upper extremity  biceps (C-5 to C-6) 2/4 biceps (C-5 to C-6) 2/4  tricep (C7) 2/4 triceps (C7) 2/4  Brachioradialis (C6) 2/4 Brachioradialis (C6) 2/4  --brisk bilaterallyy  Lower Extremity Lower Extremity  quadriceps (L-2 to L-4) 2/4 quadriceps (L-2 to L-4) 2/4  Achilles (S1) 2/4 Achilles (S1) 2/4  Plantars:  Up going bilaterally  Cerebellar:  normal finger-to-nose, normal heel-to-shin test  Gait: not tested  CV: pulses palpable throughout    ASSESSMENT Jane Farley is a 79 y.o. female presenting with dysarthria, right facial droop, right hemiparesis. Imaging confirms multifocal areas of acute infarction within the posterior circulation including the left greater than right mid to upper pons.  Infarct felt to be due to right vertebral artery  occlusion/high grade stenosis with distal embolization  On clopidogrel 75 mg orally every day prior to admission. Now on aspirin 81 mg orally every day and clopidogrel 75 mg orally every day for secondary stroke prevention. Patient with resultant dysarthria, right hemiparesis. Work up underway.   Dyslipidemia, LDL 23, at goal on statin  Hypertension  Small vessel disease  Hypoplastic right vertebral artery  HGB A1C 6.0  Hospital day # 2  TREATMENT/PLAN  Continue aspirin 81 mg orally every day and clopidogrel 75 mg orally every day for secondary stroke prevention. Continue to look for source of stroke. Concern for cardioembolic source given patients age. TEE to look for embolic source. I have consulted. If positive for PFO (patent foramen ovale), check bilateral lower extremity venous dopplers to rule out DVT as possible source of stroke.  If TEE negative, [place] cardiologist will place implantable loop recorder to evaluate for atrial fibrillation as etiology of stroke. This has been  explained to patient/family by Dr. Pearlean Brownie and they are agreeable.  CIR recommended  Risk factor management  Discussed with Dr. Gari Crown D. Manson Passey, [facility], MBA, MHA [facility] Pager: [phone] 11/24/2012 3:43 PM  I have personally obtained a history, examined the patient, evaluated imaging results, and formulated the assessment and plan of care. I agree with the above. Delia Heady, MD

## 2012-11-24 NOTE — Progress Notes (Signed)
Subjective: Completed CT-A as ordered per Stroke team.  Has better L hand strength, coordination still off.  Still speaking slowly, was able to pass her swallowing test.  Has not been able to get out of bed.  CIR has a pending, incomplete note at this point.  Objective: Vital signs in last 24 hours: Temp:  [98 F (36.7 C)-98.7 F (37.1 C)] 98.3 F (36.8 C) (10/02 0618) Pulse Rate:  [57-67] 62 (10/02 0618) Resp:  [18-20] 20 (10/02 0618) BP: (110-188)/(65-85) 110/81 mmHg (10/02 0618) SpO2:  [95 %-99 %] 95 % (10/02 0618) Weight change:  Last BM Date: 11/21/12  Intake/Output from previous day: 10/01 0701 - 10/02 0700 In: 220 [P.O.:220] Out: 1 [Urine:1] Intake/Output this shift:   General appearance: Alert, oriented, dysarthric speech.  Head: Normocephalic, band-aid on lip  Eyes: conjunctivae/corneas clear. PERRL, EOM's intact.  Nose: Nares normal. Septum midline. Mucosa normal. No drainage or sinus tenderness.  Neck: no adenopathy, no carotid bruit, no JVD and thyroid not enlarged, symmetric, no tenderness/mass/nodules  Resp: Clear bilaterally  Cardio: regular, no murmur.  GI: soft, non-tender; bowel sounds normal; no masses, no organomegaly  Extremities: extremities s edema  r side upper and Lower Ext 4/5 weakness noted.  Pulses: 2+ and symmetric  Lymph nodes: no cervical lymphadenopathy  Neurologic: Noted dysarthria, mild improvement, able to reach for food but coordination still poor, has not been out of bed.   Lab Results:  Recent Labs  11/22/12 1030 11/23/12 0600  WBC 6.2 5.6  HGB 12.2 11.9*  HCT 35.3* 34.2*  PLT 253 224   BMET  Recent Labs  11/22/12 1030 11/23/12 0600  NA 139 139  K 4.0 3.5  CL 105 104  CO2 24 25  GLUCOSE 107* 89  BUN 10 10  CREATININE 0.65 0.69  CALCIUM 8.9 8.5    Studies/Results: Ct Angio Head W/cm &/or Wo Cm  11/23/2012   CLINICAL DATA:  Brainstem stroke. Evaluate intracranial circulation.  EXAM: CT ANGIOGRAPHY HEAD AND NECK   TECHNIQUE: Multidetector CT imaging of the head and neck was performed using the standard protocol during bolus administration of intravenous contrast. Multiplanar CT image reconstructions including MIPs were obtained to evaluate the vascular anatomy. Carotid stenosis measurements (when applicable) are obtained utilizing NASCET criteria, using the distal internal carotid diameter as the denominator.  CONTRAST:  Omnipaque 350, 50 mL.  COMPARISON:  MRI and MRA 11/22/2012.  FINDINGS: CTA HEAD FINDINGS  Non stenotic calcification affects the cavernous and supraclinoid internal carotid arteries bilaterally. ICA termini are widely patent.  Mild mural irregularity of the proximal basilar just below the origin of the AICA (arrows, image 20 series [id number]). There is no basilar dissection, and no flow-limiting stenosis.  Fetal origin right PCA. Early takeoff M2 branch left MCA. No M1 MCA stenosis or significant irregularity. No flow-limiting intracranial stenosis or aneurysm.  Compared with the MRA intracranial, the degree of basilar stenosis on MRA was overestimated. There does not appear to be a significant intrinsic posterior circulation stenosis contributing to the observed brainstem infarct. 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  Re-demonstrated is atrophy with chronic microvascular ischemic change. Developing cytotoxic edema in the left pontine infarct. No abnormal postcontrast enhancement. Calvarium intact. Large retention cyst left maxillary sinus.  Review of the MIP images confirms the above findings.  CTA NECK FINDINGS  No lung apex nodule. No neck masses. Cervical spondylosis.  Heavily calcified transverse  arch. Conventional branching. Great vessel origins partially excluded with no definite proximal flow limiting lesion. Mural calcification without significant reduction of the luminal diameter can be seen in the  innominate, proximal left common carotid, and proximal left subclavian.  Heavily calcified non stenotic atheromatous change right carotid bifurcation. No measurable stenosis with ratio of 4.0/ 4.2 proximal/distal. Similarly, heavily calcified, but non stenotic atheromatous change left carotid bifurcation. No measurable internal carotid artery stenosis with a ratio of 3.9/4.4 proximal/distal. No evidence for cervical ICA fibromuscular dysplasia or dissection.  The left vertebral is the dominant contributor to the basilar. There is no ostial stenosis but a small amount of calcification is present at the left vertebral origin. Due to extreme tortuosity, there is remodeling of the foramen transversarium on the left at C4. No fibromuscular dysplasia or dissection. No intracranial stenosis.  The right vertebral is small throughout its course. There is no ostial stenosis. There is slight reduction in caliber distal to the right PICA origin which may be physiologic.  Review of the MIP images confirms the above findings.  IMPRESSION: CTA HEAD IMPRESSION  Mild irregularity of the proximal basilar without flow limiting stenosis or dissection.  Mild non stenotic irregularity both carotid siphons without flow limiting intracranial stenosis of the anterior circulation.  CTA NECK IMPRESSION  Heavily calcified but non stenotic bilateral carotid bifurcations. No evidence for carotid fibromuscular dysplasia or dissection.  Left vertebral is the dominant contributor to the posterior circulation and is free of significant disease.  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   Electronically Signed   By: Davonna Belling M.D.   On: 11/23/2012 19:31   Ct Angio Neck W/cm &/or Wo/cm  11/23/2012   CLINICAL DATA:  Brainstem stroke. Evaluate intracranial circulation.  EXAM: CT ANGIOGRAPHY HEAD AND NECK  TECHNIQUE: Multidetector CT imaging of the head and neck was performed using the standard protocol  during bolus administration of intravenous contrast. Multiplanar CT image reconstructions including MIPs were obtained to evaluate the vascular anatomy. Carotid stenosis measurements (when applicable) are obtained utilizing NASCET criteria, using the distal internal carotid diameter as the denominator.  CONTRAST:  Omnipaque 350, 50 mL.  COMPARISON:  MRI and MRA 11/22/2012.  FINDINGS: CTA HEAD FINDINGS  Non stenotic calcification affects the cavernous and supraclinoid internal carotid arteries bilaterally. ICA termini are widely patent.  Mild mural irregularity of the proximal basilar just below the origin of the AICA (arrows, image 20 series [id number]). There is no basilar dissection, and no flow-limiting stenosis.  Fetal origin right PCA. Early takeoff M2 branch left MCA. No M1 MCA stenosis or significant irregularity. No flow-limiting intracranial stenosis or aneurysm.  Compared with the MRA intracranial, the degree of basilar stenosis on MRA was overestimated. There does not appear to be a significant intrinsic posterior circulation stenosis contributing to the observed brainstem infarct. 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  Re-demonstrated is atrophy with chronic microvascular ischemic change. Developing cytotoxic edema in the left pontine infarct. No abnormal postcontrast enhancement. Calvarium intact. Large retention cyst left maxillary sinus.  Review of the MIP images confirms the above findings.  CTA NECK FINDINGS  No lung apex nodule. No neck masses. Cervical spondylosis.  Heavily calcified transverse arch. Conventional branching. Great vessel origins partially excluded with no definite proximal flow limiting lesion. Mural calcification without significant reduction of the luminal diameter can be seen in the innominate, proximal left  common carotid, and proximal left subclavian.  Heavily calcified non  stenotic atheromatous change right carotid bifurcation. No measurable stenosis with ratio of 4.0/ 4.2 proximal/distal. Similarly, heavily calcified, but non stenotic atheromatous change left carotid bifurcation. No measurable internal carotid artery stenosis with a ratio of 3.9/4.4 proximal/distal. No evidence for cervical ICA fibromuscular dysplasia or dissection.  The left vertebral is the dominant contributor to the basilar. There is no ostial stenosis but a small amount of calcification is present at the left vertebral origin. Due to extreme tortuosity, there is remodeling of the foramen transversarium on the left at C4. No fibromuscular dysplasia or dissection. No intracranial stenosis.  The right vertebral is small throughout its course. There is no ostial stenosis. There is slight reduction in caliber distal to the right PICA origin which may be physiologic.  Review of the MIP images confirms the above findings.  IMPRESSION: CTA HEAD IMPRESSION  Mild irregularity of the proximal basilar without flow limiting stenosis or dissection.  Mild non stenotic irregularity both carotid siphons without flow limiting intracranial stenosis of the anterior circulation.  CTA NECK IMPRESSION  Heavily calcified but non stenotic bilateral carotid bifurcations. No evidence for carotid fibromuscular dysplasia or dissection.  Left vertebral is the dominant contributor to the posterior circulation and is free of significant disease.  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   Electronically Signed   By: Davonna Belling M.D.   On: 11/23/2012 19:31   [name] Wo Contrast  11/22/2012   CLINICAL DATA:  Nausea. Speech difficulty.  Evaluate for stroke.  EXAM: MRI HEAD WITHOUT CONTRAST  MRA HEAD WITHOUT CONTRAST  TECHNIQUE: Multiplanar, multiecho pulse sequences of the brain and surrounding structures were obtained without intravenous contrast. Angiographic images of the head were obtained  using MRA technique without contrast.  COMPARISON:  CT head 10/19/2012 and 10/11/2012.  FINDINGS: MRI HEAD FINDINGS  Multiple foci of acute infarction are present in the posterior circulation. The largest area of infarction affects the left much greater than right mid to upper pons. Smaller multifocal areas of acute infarction are seen in the right and left cerebellar hemispheres, and left occipital cortex and white matter. There is no associated acute or chronic hemorrhage. Flow voids are maintained.  Moderate atrophy is present. Chronic microvascular ischemic change affects the periventricular and subcortical white matter. Prominent perivascular spaces suggests longstanding hypertension. Remote left basal ganglia and left periventricular white matter lacunar infarcts. No chronic large vessel infarct.  Scalp and extracranial soft tissues unremarkable. Negative osseous structures. Bilateral cataract extraction. Chronic sinus disease with moderate retention cyst left maxillary region. No acute mastoid fluid.  Compared with prior CT, these findings are not visible.  MRA HEAD FINDINGS  The internal carotid arteries are patent throughout their upper cervical, skull base, and cavernous segments. There is mild non-stenotic irregularity of the supraclinoid ICA vessels. ICA termini are widely patent. Mild irregularity both proximal anterior cerebral arteries. Mild irregularity of both proximal middle cerebral arteries. Fetal origin right PCA with mild irregularity of both proximal posterior cerebral vessels. No intracranial aneurysm.  There is moderate non stenotic irregularity of the proximal basilar artery (arrows) with the left vertebral as the dominant contributor. There is severe disease of the much smaller distal right vertebral with multiple tandem high-grade stenoses in its distal V4 segment. No stenosis or irregularity of the distal left vertebral. No flow-limiting stenosis of the basilar.  IMPRESSION: MRI HEAD  IMPRESSION  Multifocal areas of acute infarction within  the posterior circulation. The area of most significant involvement includes the left greater than right mid to upper pons. Atrophy and chronic microvascular ischemic change. Findings discussed with ordering provider.  MRA HEAD IMPRESSION  Mild non stenotic irregularity of the proximal basilar artery and both supraclinoid internal carotid arteries. No flow-limiting basilar stenosis. Severe irregularity distal right vertebral.   Electronically Signed   By: Davonna Belling M.D.   On: 11/22/2012 13:43   Mr Brain Wo Contrast  11/22/2012   CLINICAL DATA:  Nausea. Speech difficulty.  Evaluate for stroke.  EXAM: MRI HEAD WITHOUT CONTRAST  MRA HEAD WITHOUT CONTRAST  TECHNIQUE: Multiplanar, multiecho pulse sequences of the brain and surrounding structures were obtained without intravenous contrast. Angiographic images of the head were obtained using MRA technique without contrast.  COMPARISON:  CT head 10/19/2012 and 10/11/2012.  FINDINGS: MRI HEAD FINDINGS  Multiple foci of acute infarction are present in the posterior circulation. The largest area of infarction affects the left much greater than right mid to upper pons. Smaller multifocal areas of acute infarction are seen in the right and left cerebellar hemispheres, and left occipital cortex and white matter. There is no associated acute or chronic hemorrhage. Flow voids are maintained.  Moderate atrophy is present. Chronic microvascular ischemic change affects the periventricular and subcortical white matter. Prominent perivascular spaces suggests longstanding hypertension. Remote left basal ganglia and left periventricular white matter lacunar infarcts. No chronic large vessel infarct.  Scalp and extracranial soft tissues unremarkable. Negative osseous structures. Bilateral cataract extraction. Chronic sinus disease with moderate retention cyst left maxillary region. No acute mastoid fluid.  Compared with prior  CT, these findings are not visible.  MRA HEAD FINDINGS  The internal carotid arteries are patent throughout their upper cervical, skull base, and cavernous segments. There is mild non-stenotic irregularity of the supraclinoid ICA vessels. ICA termini are widely patent. Mild irregularity both proximal anterior cerebral arteries. Mild irregularity of both proximal middle cerebral arteries. Fetal origin right PCA with mild irregularity of both proximal posterior cerebral vessels. No intracranial aneurysm.  There is moderate non stenotic irregularity of the proximal basilar artery (arrows) with the left vertebral as the dominant contributor. There is severe disease of the much smaller distal right vertebral with multiple tandem high-grade stenoses in its distal V4 segment. No stenosis or irregularity of the distal left vertebral. No flow-limiting stenosis of the basilar.  IMPRESSION: MRI HEAD IMPRESSION  Multifocal areas of acute infarction within the posterior circulation. The area of most significant involvement includes the left greater than right mid to upper pons. Atrophy and chronic microvascular ischemic change. Findings discussed with ordering provider.  MRA HEAD IMPRESSION  Mild non stenotic irregularity of the proximal basilar artery and both supraclinoid internal carotid arteries. No flow-limiting basilar stenosis. Severe irregularity distal right vertebral.   Electronically Signed   By: Davonna Belling M.D.   On: 11/22/2012 13:43    Medications:  I have reviewed the patient's current medications. Scheduled: . aspirin EC  81 mg Oral Daily  . calcium carbonate  1 tablet Oral Daily  . clopidogrel  75 mg Oral Daily  . enoxaparin (LOVENOX) injection  40 mg Subcutaneous Q24H  . losartan  100 mg Oral Daily  . simvastatin  20 mg Oral QHS  . 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 Continuing antiplatelets, further workup underway per Stroke service including Echo  and carotids/transcranial dopplers. Her workup last April was done as an outpatient as she presented several days post event with ocular symptoms and confirmed CVA. Will forward records to her chart here. Her last LDL was 72 on Simvastatin. PT/OT and Speech evals completed, recommending CIR.  CT-A was completed last night as well and despite my faxing her records over with labs, lipid panel redrawn and pending this AM. HTN- Continue Losartan  Lipids- Simvastatin.  Dispo- CIR recommended, awaiting completed note as to coverage/availability.  She is adamant about not rehabbing at a SNF, but she has not made a dramatic enough recovery in the past 24 hours that I would find it safe to return home, especially living independently.  If they have bed available, will transfer today and care manager will need to contact our office so I can complete orders unless done by stroke team.  Per their notes, they have completed their workup at this point, awaiting final comment note.   LOS: 2 days   Oakleigh Hesketh W 11/24/2012, 7:59 AM

## 2012-11-24 NOTE — Progress Notes (Signed)
Physical Therapy Treatment Patient Details Name: Jane Farley MRN: [id number] DOB: 1932/09/01 Today's Date: 11/24/2012 Time: 4098-1191 PT Time Calculation (min): 40 min  PT Assessment / Plan / Recommendation  History of Present Illness 79 yo female admitted for difficulty with words. MRI (+) for posterior CVA. Pt with hx in APril 2014 CVA with visual deficits. Pt currently with correct lens in glasses to prevent diplopia.   PT Comments   Pt is progressing well with mobility, however, she continues to be a high fall risk as exhibited by her poor use of RW, decreased balance, decreased attention to the right side, and right sided weakness.  The pt was denied CIR admission and is now seeking SNF placement for rehab.  Her preference is [facility].    Follow Up Recommendations  SNF (CIR declined)     Does the patient have the potential to tolerate intense rehabilitation    Yes  Barriers to Discharge   None      Equipment Recommendations  Rolling walker with 5" wheels    Recommendations for Other Services   None  Frequency Min 3X/week   Progress towards PT Goals Progress towards PT goals: Progressing toward goals  Plan Discharge plan needs to be updated;Frequency needs to be updated    Precautions / Restrictions Precautions Precautions: Fall Precaution Comments: discoordination, weakness   Pertinent Vitals/Pain See vitals flow sheet.    Mobility  Bed Mobility Supine to Sit: 5: Supervision;HOB elevated;With rails Sitting - Scoot to Edge of Bed: 5: Supervision;With rail Details for Bed Mobility Assistance: Pt needed increased time to struggle her way to the side of the bed.  She relied heavily on the railing and the [facility] raised to help.   Transfers Sit to Stand: 4: Min assist;With upper extremity assist;From bed Stand to Sit: 4: Min assist;With upper extremity assist;To bed Details for Transfer Assistance: min assist to support trunk for balance, verbal cues for safe hand  placement.   Ambulation/Gait Ambulation/Gait Assistance: 4: Min assist Ambulation Distance (Feet): 300 Feet Assistive device: Rolling walker Ambulation/Gait Assistance Details: min assist to support trunk for balance and prevent pt from running into the right side (pt lists and pulls the walker to the right-verbal cues and manual assist needed to compensate) Gait Pattern: Step-through pattern;Shuffle;Trunk flexed Gait velocity: decreased General Gait Details: verbal cues for safe use of RW Modified Rankin (Stroke Patients Only) Pre-Morbid Rankin Score: No significant disability Modified Rankin: Moderately severe disability    Exercises General Exercises - Upper Extremity Shoulder Flexion: AROM;Both;10 reps;Seated Elbow Flexion: AROM;Both;10 reps Elbow Extension: Seated General Exercises - Lower Extremity Long Arc Quad: AROM;Both;10 reps;Seated Hip Flexion/Marching: AROM;Both;10 reps;Seated Toe Raises: AROM;Both;10 reps;Seated Heel Raises: AROM;Both;10 reps;Seated     PT Goals (current goals can now be found in the care plan section) Acute Rehab PT Goals Patient Stated Goal: to get back to her normal level of independence  Visit Information  Last PT Received On: 11/24/12 Assistance Needed: +1 History of Present Illness: 79 yo female admitted for difficulty with words. MRI (+) for posterior CVA. Pt with hx in APril 2014 CVA with visual deficits. Pt currently with correct lens in glasses to prevent diplopia.    Subjective Data  Subjective: Pt tearful throughout the session.  She reports that she doesn't want to have to go to the nursing home.  "Why did this have to happen to me?" Patient Stated Goal: to get back to her normal level of independence   Cognition  Cognition Arousal/Alertness: Awake/alert  Behavior During Therapy: WFL for tasks assessed/performed;Flat affect Overall Cognitive Status: Within Functional Limits for tasks assessed    Balance  Static Sitting  Balance Static Sitting - Balance Support: Bilateral upper extremity supported;Feet supported Static Sitting - Level of Assistance: 5: Stand by assistance Static Standing Balance Static Standing - Balance Support: Bilateral upper extremity supported Static Standing - Level of Assistance: 5: Stand by assistance Dynamic Standing Balance Dynamic Standing - Balance Support: Bilateral upper extremity supported Dynamic Standing - Level of Assistance: 4: Min assist  End of Session PT - End of Session Equipment Utilized During Treatment: Gait belt Activity Tolerance: Patient tolerated treatment well Patient left: in bed;with call bell/phone within reach;with bed alarm set     Jane Farley, PT, DPT [phone]   11/24/2012, 4:42 PM

## 2012-11-24 NOTE — Clinical Social Work Psychosocial (Signed)
Clinical Social Work Department BRIEF PSYCHOSOCIAL ASSESSMENT 11/24/2012  Patient:  Jane Farley, Jane Farley     Account Number:  [id number]     Admit date:  11/22/2012  Clinical Social Worker:  Sherre Lain  Date/Time:  11/24/2012 09:25 PM  Referred by:  Physician  Date Referred:  11/24/2012 Referred for  SNF Placement   Other Referral:   none.   Interview type:  Patient Other interview type:   none.    PSYCHOSOCIAL DATA Living Status:  ALONE Admitted from facility:   Level of care:   Primary support name:  [name] Primary support relationship to patient:  CHILD, ADULT Degree of support available:   Strong.    CURRENT CONCERNS Current Concerns  Post-Acute Placement   Other Concerns:   none.    SOCIAL WORK ASSESSMENT / PLAN CSW met with pt at bedside. Pt stated that prior to be admitted to [facility], pt was living alone. Pt stated that she did not particularly want to go to SNF, but knew that it was something she "had to do to get better." Pt stated that she was interested in [name]. CSW to continue to follow and assist with discharge planning needs.   Assessment/plan status:  Psychosocial Support/Ongoing Assessment of Needs Other assessment/ plan:   none.   Information/referral to community resources:   [facility] placement.    PATIENTS/FAMILYS RESPONSE TO PLAN OF CARE: Pt was understanding and agreeable to CSW plan of care.     Darlyn Chamber, MSW, LCSWA Clinical Social Work [phone]

## 2012-11-25 ENCOUNTER — Encounter (HOSPITAL_COMMUNITY): Payer: Self-pay | Admitting: *Deleted

## 2012-11-25 ENCOUNTER — Encounter (HOSPITAL_COMMUNITY)
Admission: EM | Disposition: A | Discharge status: Skilled Nursing Facility | Payer: Self-pay | Source: Home / Self Care | Attending: Internal Medicine

## 2012-11-25 DIAGNOSIS — I6350 Cerebral infarction due to unspecified occlusion or stenosis of unspecified cerebral artery: Secondary | ICD-10-CM | POA: Diagnosis not present

## 2012-11-25 DIAGNOSIS — I6789 Other cerebrovascular disease: Secondary | ICD-10-CM | POA: Diagnosis not present

## 2012-11-25 DIAGNOSIS — I119 Hypertensive heart disease without heart failure: Secondary | ICD-10-CM | POA: Diagnosis not present

## 2012-11-25 DIAGNOSIS — I69920 Aphasia following unspecified cerebrovascular disease: Secondary | ICD-10-CM | POA: Diagnosis not present

## 2012-11-25 DIAGNOSIS — E785 Hyperlipidemia, unspecified: Secondary | ICD-10-CM | POA: Diagnosis not present

## 2012-11-25 DIAGNOSIS — G459 Transient cerebral ischemic attack, unspecified: Secondary | ICD-10-CM | POA: Diagnosis not present

## 2012-11-25 DIAGNOSIS — G8190 Hemiplegia, unspecified affecting unspecified side: Secondary | ICD-10-CM | POA: Diagnosis not present

## 2012-11-25 DIAGNOSIS — Z86718 Personal history of other venous thrombosis and embolism: Secondary | ICD-10-CM

## 2012-11-25 DIAGNOSIS — I679 Cerebrovascular disease, unspecified: Secondary | ICD-10-CM | POA: Diagnosis not present

## 2012-11-25 DIAGNOSIS — I10 Essential (primary) hypertension: Secondary | ICD-10-CM | POA: Diagnosis not present

## 2012-11-25 DIAGNOSIS — Q211 Atrial septal defect: Secondary | ICD-10-CM

## 2012-11-25 HISTORY — PX: LOOP RECORDER IMPLANT: SHX5477

## 2012-11-25 HISTORY — PX: TEE WITHOUT CARDIOVERSION: SHX5443

## 2012-11-25 SURGERY — ECHOCARDIOGRAM, TRANSESOPHAGEAL
Anesthesia: Moderate Sedation

## 2012-11-25 SURGERY — LOOP RECORDER IMPLANT
Anesthesia: LOCAL

## 2012-11-25 MED ORDER — SODIUM CHLORIDE 0.9 % IV SOLN: 250.0000 mL | INTRAVENOUS | Status: DC | PRN

## 2012-11-25 MED ORDER — ONDANSETRON HCL 4 MG/2ML IJ SOLN: 4.0000 mg | Freq: Four times a day (QID) | INTRAMUSCULAR | Status: DC | PRN

## 2012-11-25 MED ORDER — ACETAMINOPHEN 325 MG PO TABS: 650.0000 mg | ORAL_TABLET | ORAL | Status: DC | PRN

## 2012-11-25 MED ORDER — MIDAZOLAM HCL 5 MG/ML IJ SOLN
INTRAMUSCULAR | Status: AC
  Filled 2012-11-25: qty 2

## 2012-11-25 MED ORDER — BUTAMBEN-TETRACAINE-BENZOCAINE 2-2-14 % EX AERO
INHALATION_SPRAY | CUTANEOUS | Status: DC | PRN
  Administered 2012-11-25: 2 via TOPICAL

## 2012-11-25 MED ORDER — ASPIRIN 81 MG PO TBEC: 81.0000 mg | DELAYED_RELEASE_TABLET | Freq: Every day | ORAL | Status: DC

## 2012-11-25 MED ORDER — FENTANYL CITRATE 0.05 MG/ML IJ SOLN
INTRAMUSCULAR | Status: DC | PRN
  Administered 2012-11-25 (×2): 25 ug via INTRAVENOUS

## 2012-11-25 MED ORDER — SODIUM CHLORIDE 0.9 % IJ SOLN: 3.0000 mL | Freq: Two times a day (BID) | INTRAMUSCULAR | Status: DC

## 2012-11-25 MED ORDER — FENTANYL CITRATE 0.05 MG/ML IJ SOLN
INTRAMUSCULAR | Status: AC
  Filled 2012-11-25: qty 2

## 2012-11-25 MED ORDER — SODIUM CHLORIDE 0.9 % IJ SOLN: 3.0000 mL | INTRAMUSCULAR | Status: DC | PRN

## 2012-11-25 MED ORDER — ENSURE COMPLETE PO LIQD: 237.0000 mL | Freq: Two times a day (BID) | ORAL | Status: DC | PRN

## 2012-11-25 MED ORDER — MIDAZOLAM HCL 10 MG/2ML IJ SOLN
INTRAMUSCULAR | Status: DC | PRN
  Administered 2012-11-25 (×2): 2 mg via INTRAVENOUS

## 2012-11-25 MED ORDER — LIDOCAINE HCL (PF) 1 % IJ SOLN
INTRAMUSCULAR | Status: AC
  Filled 2012-11-25: qty 30

## 2012-11-25 NOTE — Discharge Summary (Signed)
DISCHARGE SUMMARY  Jane Farley  MR#: [id number]  DOB:[birth year]  Date of Admission: 11/22/2012 Date of Discharge: 11/25/2012  Attending Physician:Tejas Seawood W  Patient's NWG:NFAOZHY,QMVHQIO W, MD  Consults:Treatment Team:  Md Stroke, MD Hillis Range, MD  Discharge Diagnoses: Principal Problem:   CVA (cerebral infarction) Active Problems:   Aphasia   HTN (hypertension)   Hyperlipidemia   PFO (patent foramen ovale)   Discharge Medications:   Medication List    ASK your doctor about these medications       calcium carbonate 600 MG Tabs tablet  Commonly known as:  OS-CAL  Take 600 mg by mouth daily.     clopidogrel 75 MG tablet  Commonly known as:  PLAVIX  Take 1 tablet (75 mg total) by mouth daily.     losartan 100 MG tablet  Commonly known as:  COZAAR  Take 100 mg by mouth daily.     OVER THE COUNTER MEDICATION  Take 15 mLs by mouth daily as needed (sleep).     RECLAST IV  Inject into the vein. Once a year     simvastatin 20 MG tablet  Commonly known as:  ZOCOR  Take 20 mg by mouth at bedtime.          Aspirin 81mg  once daily   Ensure 1 can bid for poor PO intake Hospital Procedures: Ct Angio Head W/cm &/or Wo Cm  11/23/2012   CLINICAL DATA:  Brainstem stroke. Evaluate intracranial circulation.  EXAM: CT ANGIOGRAPHY HEAD AND NECK  TECHNIQUE: Multidetector CT imaging of the head and neck was performed using the standard protocol during bolus administration of intravenous contrast. Multiplanar CT image reconstructions including MIPs were obtained to evaluate the vascular anatomy. Carotid stenosis measurements (when applicable) are obtained utilizing NASCET criteria, using the distal internal carotid diameter as the denominator.  CONTRAST:  Omnipaque 350, 50 mL.  COMPARISON:  MRI and MRA 11/22/2012.  FINDINGS: CTA HEAD FINDINGS  Non stenotic calcification affects the cavernous and supraclinoid internal carotid arteries bilaterally. ICA termini are widely  patent.  Mild mural irregularity of the proximal basilar just below the origin of the AICA (arrows, image 20 series [id number]). There is no basilar dissection, and no flow-limiting stenosis.  Fetal origin right PCA. Early takeoff M2 branch left MCA. No M1 MCA stenosis or significant irregularity. No flow-limiting intracranial stenosis or aneurysm.  Compared with the MRA intracranial, the degree of basilar stenosis on MRA was overestimated. There does not appear to be a significant intrinsic posterior circulation stenosis contributing to the observed brainstem infarct. 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  Re-demonstrated is atrophy with chronic microvascular ischemic change. Developing cytotoxic edema in the left pontine infarct. No abnormal postcontrast enhancement. Calvarium intact. Large retention cyst left maxillary sinus.  Review of the MIP images confirms the above findings.  CTA NECK FINDINGS  No lung apex nodule. No neck masses. Cervical spondylosis.  Heavily calcified transverse arch. Conventional branching. Great vessel origins partially excluded with no definite proximal flow limiting lesion. Mural calcification without significant reduction of the luminal diameter can be seen in the innominate, proximal left common carotid, and proximal left subclavian.  Heavily calcified non stenotic atheromatous change right carotid bifurcation. No measurable stenosis with ratio of 4.0/ 4.2 proximal/distal. Similarly, heavily calcified, but non stenotic atheromatous change left carotid bifurcation. No measurable internal carotid artery stenosis with a ratio of 3.9/4.4 proximal/distal. No evidence for cervical  ICA fibromuscular dysplasia or dissection.  The left vertebral is the dominant contributor to the basilar. There is no ostial stenosis but a small amount of calcification is present at the left vertebral origin.  Due to extreme tortuosity, there is remodeling of the foramen transversarium on the left at C4. No fibromuscular dysplasia or dissection. No intracranial stenosis.  The right vertebral is small throughout its course. There is no ostial stenosis. There is slight reduction in caliber distal to the right PICA origin which may be physiologic.  Review of the MIP images confirms the above findings.  IMPRESSION: CTA HEAD IMPRESSION  Mild irregularity of the proximal basilar without flow limiting stenosis or dissection.  Mild non stenotic irregularity both carotid siphons without flow limiting intracranial stenosis of the anterior circulation.  CTA NECK IMPRESSION  Heavily calcified but non stenotic bilateral carotid bifurcations. No evidence for carotid fibromuscular dysplasia or dissection.  Left vertebral is the dominant contributor to the posterior circulation and is free of significant disease.  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   Electronically Signed   By: Davonna Belling M.D.   On: 11/23/2012 19:31   Ct Angio Neck W/cm &/or Wo/cm  11/23/2012   CLINICAL DATA:  Brainstem stroke. Evaluate intracranial circulation.  EXAM: CT ANGIOGRAPHY HEAD AND NECK  TECHNIQUE: Multidetector CT imaging of the head and neck was performed using the standard protocol during bolus administration of intravenous contrast. Multiplanar CT image reconstructions including MIPs were obtained to evaluate the vascular anatomy. Carotid stenosis measurements (when applicable) are obtained utilizing NASCET criteria, using the distal internal carotid diameter as the denominator.  CONTRAST:  Omnipaque 350, 50 mL.  COMPARISON:  MRI and MRA 11/22/2012.  FINDINGS: CTA HEAD FINDINGS  Non stenotic calcification affects the cavernous and supraclinoid internal carotid arteries bilaterally. ICA termini are widely patent.  Mild mural irregularity of the proximal basilar just below the origin of the AICA (arrows,  image 20 series [id number]). There is no basilar dissection, and no flow-limiting stenosis.  Fetal origin right PCA. Early takeoff M2 branch left MCA. No M1 MCA stenosis or significant irregularity. No flow-limiting intracranial stenosis or aneurysm.  Compared with the MRA intracranial, the degree of basilar stenosis on MRA was overestimated. There does not appear to be a significant intrinsic posterior circulation stenosis contributing to the observed brainstem infarct. 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  Re-demonstrated is atrophy with chronic microvascular ischemic change. Developing cytotoxic edema in the left pontine infarct. No abnormal postcontrast enhancement. Calvarium intact. Large retention cyst left maxillary sinus.  Review of the MIP images confirms the above findings.  CTA NECK FINDINGS  No lung apex nodule. No neck masses. Cervical spondylosis.  Heavily calcified transverse arch. Conventional branching. Great vessel origins partially excluded with no definite proximal flow limiting lesion. Mural calcification without significant reduction of the luminal diameter can be seen in the innominate, proximal left common carotid, and proximal left subclavian.  Heavily calcified non stenotic atheromatous change right carotid bifurcation. No measurable stenosis with ratio of 4.0/ 4.2 proximal/distal. Similarly, heavily calcified, but non stenotic atheromatous change left carotid bifurcation. No measurable internal carotid artery stenosis with a ratio of 3.9/4.4 proximal/distal. No evidence for cervical ICA fibromuscular dysplasia or dissection.  The left vertebral is the dominant contributor to the basilar. There is no ostial stenosis but a small amount of calcification is present at the left  vertebral origin. Due to extreme tortuosity, there is remodeling of the foramen transversarium on the left at C4. No  fibromuscular dysplasia or dissection. No intracranial stenosis.  The right vertebral is small throughout its course. There is no ostial stenosis. There is slight reduction in caliber distal to the right PICA origin which may be physiologic.  Review of the MIP images confirms the above findings.  IMPRESSION: CTA HEAD IMPRESSION  Mild irregularity of the proximal basilar without flow limiting stenosis or dissection.  Mild non stenotic irregularity both carotid siphons without flow limiting intracranial stenosis of the anterior circulation.  CTA NECK IMPRESSION  Heavily calcified but non stenotic bilateral carotid bifurcations. No evidence for carotid fibromuscular dysplasia or dissection.  Left vertebral is the dominant contributor to the posterior circulation and is free of significant disease.  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   Electronically Signed   By: Davonna Belling M.D.   On: 11/23/2012 19:31   [name] Wo Contrast  11/22/2012   CLINICAL DATA:  Nausea. Speech difficulty.  Evaluate for stroke.  EXAM: MRI HEAD WITHOUT CONTRAST  MRA HEAD WITHOUT CONTRAST  TECHNIQUE: Multiplanar, multiecho pulse sequences of the brain and surrounding structures were obtained without intravenous contrast. Angiographic images of the head were obtained using MRA technique without contrast.  COMPARISON:  CT head 10/19/2012 and 10/11/2012.  FINDINGS: MRI HEAD FINDINGS  Multiple foci of acute infarction are present in the posterior circulation. The largest area of infarction affects the left much greater than right mid to upper pons. Smaller multifocal areas of acute infarction are seen in the right and left cerebellar hemispheres, and left occipital cortex and white matter. There is no associated acute or chronic hemorrhage. Flow voids are maintained.  Moderate atrophy is present. Chronic microvascular ischemic change affects the periventricular and subcortical white matter.  Prominent perivascular spaces suggests longstanding hypertension. Remote left basal ganglia and left periventricular white matter lacunar infarcts. No chronic large vessel infarct.  Scalp and extracranial soft tissues unremarkable. Negative osseous structures. Bilateral cataract extraction. Chronic sinus disease with moderate retention cyst left maxillary region. No acute mastoid fluid.  Compared with prior CT, these findings are not visible.  MRA HEAD FINDINGS  The internal carotid arteries are patent throughout their upper cervical, skull base, and cavernous segments. There is mild non-stenotic irregularity of the supraclinoid ICA vessels. ICA termini are widely patent. Mild irregularity both proximal anterior cerebral arteries. Mild irregularity of both proximal middle cerebral arteries. Fetal origin right PCA with mild irregularity of both proximal posterior cerebral vessels. No intracranial aneurysm.  There is moderate non stenotic irregularity of the proximal basilar artery (arrows) with the left vertebral as the dominant contributor. There is severe disease of the much smaller distal right vertebral with multiple tandem high-grade stenoses in its distal V4 segment. No stenosis or irregularity of the distal left vertebral. No flow-limiting stenosis of the basilar.  IMPRESSION: MRI HEAD IMPRESSION  Multifocal areas of acute infarction within the posterior circulation. The area of most significant involvement includes the left greater than right mid to upper pons. Atrophy and chronic microvascular ischemic change. Findings discussed with ordering provider.  MRA HEAD IMPRESSION  Mild non stenotic irregularity of the proximal basilar artery and both supraclinoid internal carotid arteries. No flow-limiting basilar stenosis. Severe irregularity distal right vertebral.   Electronically Signed   By: Davonna Belling M.D.   On: 11/22/2012 13:43   Mr Brain Wo Contrast  11/22/2012  CLINICAL DATA:  Nausea. Speech  difficulty.  Evaluate for stroke.  EXAM: MRI HEAD WITHOUT CONTRAST  MRA HEAD WITHOUT CONTRAST  TECHNIQUE: Multiplanar, multiecho pulse sequences of the brain and surrounding structures were obtained without intravenous contrast. Angiographic images of the head were obtained using MRA technique without contrast.  COMPARISON:  CT head 10/19/2012 and 10/11/2012.  FINDINGS: MRI HEAD FINDINGS  Multiple foci of acute infarction are present in the posterior circulation. The largest area of infarction affects the left much greater than right mid to upper pons. Smaller multifocal areas of acute infarction are seen in the right and left cerebellar hemispheres, and left occipital cortex and white matter. There is no associated acute or chronic hemorrhage. Flow voids are maintained.  Moderate atrophy is present. Chronic microvascular ischemic change affects the periventricular and subcortical white matter. Prominent perivascular spaces suggests longstanding hypertension. Remote left basal ganglia and left periventricular white matter lacunar infarcts. No chronic large vessel infarct.  Scalp and extracranial soft tissues unremarkable. Negative osseous structures. Bilateral cataract extraction. Chronic sinus disease with moderate retention cyst left maxillary region. No acute mastoid fluid.  Compared with prior CT, these findings are not visible.  MRA HEAD FINDINGS  The internal carotid arteries are patent throughout their upper cervical, skull base, and cavernous segments. There is mild non-stenotic irregularity of the supraclinoid ICA vessels. ICA termini are widely patent. Mild irregularity both proximal anterior cerebral arteries. Mild irregularity of both proximal middle cerebral arteries. Fetal origin right PCA with mild irregularity of both proximal posterior cerebral vessels. No intracranial aneurysm.  There is moderate non stenotic irregularity of the proximal basilar artery (arrows) with the left vertebral as the  dominant contributor. There is severe disease of the much smaller distal right vertebral with multiple tandem high-grade stenoses in its distal V4 segment. No stenosis or irregularity of the distal left vertebral. No flow-limiting stenosis of the basilar.  IMPRESSION: MRI HEAD IMPRESSION  Multifocal areas of acute infarction within the posterior circulation. The area of most significant involvement includes the left greater than right mid to upper pons. Atrophy and chronic microvascular ischemic change. Findings discussed with ordering provider.  MRA HEAD IMPRESSION  Mild non stenotic irregularity of the proximal basilar artery and both supraclinoid internal carotid arteries. No flow-limiting basilar stenosis. Severe irregularity distal right vertebral.   Electronically Signed   By: Davonna Belling M.D.   On: 11/22/2012 13:43    History of Present Illness: Patient is a 79 year old female with history of stroke earlier in the year with vision issues, started on antiplatelet who presented with R sided weakness and dysarthria  Hospital Course: Jane Farley was admitted to my service following difficulty in speaking and walking after getting up the morning of admission.  She presented to the ER and was diagnosed with CVA per her MRI, coming from posterior circulation.  She had recently been changed from ASA 325mg  to Plavix. Workup showed no issues with stenoses of carotids or circulatory system, mild scattered plaque similar to workup in April.  TEE revealed a small PFO.  LE dopplers were negative for DVT, so per Neuro, she was to remain on antiplatelet therapy.  At time of discharge, she requires a walker and has balance issues which is new to her and her speech is difficult as well.  [facility] refused her admission so she will be going to a SNF until she can return home dependently. Day of Discharge Exam BP 123/66  Pulse 58  Temp(Src) 98 F (36.7 C) (  Oral)  Resp 17  Ht 5\' 2"  (1.575 m)  Wt 58.514 kg (129 lb)  BMI  23.59 kg/m2  SpO2 93%  Physical Exam: General appearance: Alert, oriented, dysarthric speech.  Head: Normocephalic, band-aid on lip  Eyes: conjunctivae/corneas clear. PERRL, EOM's intact.  Nose: Nares normal. Septum midline. Mucosa normal. No drainage or sinus tenderness.  Neck: no adenopathy, no carotid bruit, no JVD and thyroid not enlarged, symmetric, no tenderness/mass/nodules  Resp: Clear bilaterally  Cardio: regular, no murmur.  GI: soft, non-tender; bowel sounds normal; no masses, no organomegaly  Extremities: extremities s edema  r side upper and Lower Ext 4/5 weakness noted.  Pulses: 2+ and symmetric  Lymph nodes: no cervical lymphadenopathy  Neurologic: Noted dysarthria, mild improvement, able to reach for food but coordination still poor, has not been out of bed.   Discharge Labs:  Recent Labs  11/23/12 0600  NA 139  K 3.5  CL 104  CO2 25  GLUCOSE 89  BUN 10  CREATININE 0.69  CALCIUM 8.5    Recent Labs  11/23/12 0600  AST 11  ALT 7  ALKPHOS 47  BILITOT 0.5  PROT 6.4  ALBUMIN 3.3*    Recent Labs  11/23/12 0600  WBC 5.6  HGB 11.9*  HCT 34.2*  MCV 87.9  PLT 224   Lab Results  Component Value Date   INR 0.94 11/22/2012   INR 0.95 10/11/2012     Discharge instructions:  PT/OT/Speech therapy for deficits related to stroke.   Disposition: [facility] Follow-up Appts: Follow-up with Dr. Wylene Simmer at [facility] in 1 week post discharge from facility.  Call for appointment.  Condition on Discharge: Stable    Signed: Analie Katzman W 11/25/2012, 1:29 PM

## 2012-11-25 NOTE — Progress Notes (Signed)
VASCULAR LAB PRELIMINARY  PRELIMINARY  PRELIMINARY  PRELIMINARY  Bilateral lower extremity venous duplex completed.    Preliminary report:  Bilateral:  No evidence of DVT, superficial thrombosis, or Baker's Cyst.                                 TCD completed.   Fallan Mccarey, RVS 11/25/2012, 1:55 PM

## 2012-11-25 NOTE — Progress Notes (Signed)
Echocardiogram Echocardiogram Transesophageal has been performed.  Demontre Padin 11/25/2012, 10:53 AM

## 2012-11-25 NOTE — Interval H&P Note (Signed)
History and Physical Interval Note:Since she was seen by Dr. Johney Frame, she has undergone TEE which demonstrated a PFO and a subsequent lower extremity doppler study was negative. Will plan to place ILR.   11/25/2012 3:50 PM  Jane Farley  has presented today for surgery, with the diagnosis of Syncope  The various methods of treatment have been discussed with the patient and family. After consideration of risks, benefits and other options for treatment, the patient has consented to  Procedure(s): LOOP RECORDER IMPLANT (N/A) as a surgical intervention .  The patient's history has been reviewed, patient examined, no change in status, stable for surgery.  I have reviewed the patient's chart and labs.  Questions were answered to the patient's satisfaction.     Leonia Reeves.D.

## 2012-11-25 NOTE — CV Procedure (Signed)
Electrophysiology procedure note  Procedure: Insertion of an implantable loop recorder  Indication: Cryptogenic stroke  Findings: After informed consent was obtained, the patient was taken to the diagnostic electrophysiology laboratory in the fasting state. After the usual preparation and draping, 30 cc of lidocaine was infiltrated into the left pectoral region. A 1 cm stab incision was carried out. A [employer] reveal link implantable loop recorder serial number [id number] was placed in the subcutaneous space. 0.5 mV R waves were measured. A pressure dressing was placed. Benzoin and Steri-Strip worse pain over the skin. The patient was returned to her room in satisfactory condition.  Complications: No immediate procedural complications  Lewayne Bunting, M.D.

## 2012-11-25 NOTE — Progress Notes (Signed)
Stroke Team Progress Note  HISTORY Jane Farley is an 79 y.o. female with previous left thalamic stroke noted by CT on 10/11/12. Patient is a poor historian but states she felt sick last night and called her sister. Due to having dysarthric speech her sister brought her to hospital. Per note from Dr. Timothy Lasso patient recently suffered from a CVA in April and had a stroke work up "showing Multifocal regions of Iscemic changes in L Cerebellar hemisphere and L Occipital Lobe. Small additional lesions are present within the medial L Temporal Lobe and Upper Thalmus. Moderately advanced chronic microvascular disease. Maxillary Retention cyst. Negative MRA of Carotids. Developmental varients of the circle of Willis without stenosis. Carotid Dopplers = Multifocal plaque L>R carotids with < 60% stenosis B."  Patient states she recently was placed on Plavix one week ago. Currently she is showing a right facial droop, severely dysarthric, showing right arm and legs weakness.   Date last known well: Date: 11/21/2012  Time last known well: Time: 21:00  tPA Given: No: out of window  She was admitted  for further evaluation and treatment.  SUBJECTIVE Her family is at the bedside. Just back from TEE. +PFO.   OBJECTIVE Most recent Vital Signs: Filed Vitals:   11/25/12 1050 11/25/12 1100 11/25/12 1110 11/25/12 1450  BP: 132/64 136/67 123/66 141/75  Pulse: 61 60 58 64  Temp:    98 F (36.7 C)  TempSrc:    Oral  Resp: 16 15 17 16   Height:      Weight:      SpO2: 97% 93% 93% 96%   CBG (last 3)  No results found for this basename: GLUCAP,  in the last 72 hours  IV Fluid Intake:     MEDICATIONS  . aspirin EC  81 mg Oral Daily  . calcium carbonate  1 tablet Oral Daily  . clopidogrel  75 mg Oral Daily  . enoxaparin (LOVENOX) injection  40 mg Subcutaneous Q24H  . losartan  100 mg Oral Daily  . simvastatin  20 mg Oral QHS  . sodium chloride  3 mL Intravenous Q12H   PRN:  sodium chloride, acetaminophen,  acetaminophen, feeding supplement, sodium chloride  Diet:  Cardiac thin liquids Activity:  Up with assistance DVT Prophylaxis:  Lovenox, SCD  CLINICALLY SIGNIFICANT STUDIES Basic Metabolic Panel:   Recent Labs Lab 11/22/12 1030 11/23/12 0600  NA 139 139  K 4.0 3.5  CL 105 104  CO2 24 25  GLUCOSE 107* 89  BUN 10 10  CREATININE 0.65 0.69  CALCIUM 8.9 8.5   Liver Function Tests:   Recent Labs Lab 11/22/12 1030 11/23/12 0600  AST 12 11  ALT 8 7  ALKPHOS 46 47  BILITOT 0.4 0.5  PROT 6.8 6.4  ALBUMIN 3.7 3.3*   CBC:   Recent Labs Lab 11/22/12 1030 11/23/12 0600  WBC 6.2 5.6  NEUTROABS 4.6  --   HGB 12.2 11.9*  HCT 35.3* 34.2*  MCV 87.8 87.9  PLT 253 224   Coagulation:   Recent Labs Lab 11/22/12 1217  LABPROT 12.4  INR 0.94   Cardiac Enzymes: No results found for this basename: CKTOTAL, CKMB, CKMBINDEX, TROPONINI,  in the last 168 hours Urinalysis:   Recent Labs Lab 11/22/12 1105  COLORURINE YELLOW  LABSPEC 1.009  PHURINE 6.5  GLUCOSEU NEGATIVE  HGBUR MODERATE*  BILIRUBINUR NEGATIVE  KETONESUR NEGATIVE  PROTEINUR NEGATIVE  UROBILINOGEN 0.2  NITRITE NEGATIVE  LEUKOCYTESUR NEGATIVE   Lipid Panel  Component Value Date/Time   CHOL 150 11/24/2012 0715   LDL 23  HgbA1C  Lab Results  Component Value Date   HGBA1C 6.0* 11/23/2012    Urine Drug Screen:     Component Value Date/Time   LABOPIA NONE DETECTED 11/22/2012 1105   COCAINSCRNUR NONE DETECTED 11/22/2012 1105   LABBENZ NONE DETECTED 11/22/2012 1105   AMPHETMU NONE DETECTED 11/22/2012 1105   THCU NONE DETECTED 11/22/2012 1105   LABBARB NONE DETECTED 11/22/2012 1105    Alcohol Level:   Recent Labs Lab 11/22/12 1217  ETH <11    [name] Wo Contrast 11/22/2012   Mild non stenotic irregularity of the proximal basilar artery and both supraclinoid internal carotid arteries. No flow-limiting basilar stenosis. Severe irregularity distal right vertebral.     Mr Brain Wo  Contrast 11/22/2012   Multifocal areas of acute infarction within the posterior circulation. The area of most significant involvement includes the left greater than right mid to upper pons. Atrophy and chronic microvascular ischemic change. Findings discussed with ordering provider.  CTA Head Mild irregularity of the proximal basilar without flow limiting  stenosis or dissection.  Mild non stenotic irregularity both carotid siphons without flow  limiting intracranial stenosis of the anterior circulation.   CTA Neck Heavily calcified but non stenotic bilateral carotid bifurcations.  No evidence for carotid fibromuscular dysplasia or dissection.  Left vertebral is the dominant contributor to the posterior  circulation and is free of significant disease.  The right vertebral is small throughout its course, likely  congenitally hypoplastic.   CT of the brain    TCD---  LE DOPPLERS Bilateral: No evidence of DVT, superficial thrombosis, or Baker's Cyst  2D Echocardiogram  EF 60%, wall motion normal, No ASD or PFO.  Carotid Doppler  Bilateral: 1-39% ICA stenosis. Vertebral artery flow is antegrade  CXR    EKG  normal sinus rhythm.   Therapy Recommendations CIR  Physical Exam    Mental Status:  Alert, oriented, speech dysarthric without evidence of aphasia. Able to follow 3 step commands without difficulty.  Cranial Nerves:  II: Discs flat bilaterally; Visual fields grossly normal, pupils equal, round, reactive to light and accommodation  III,IV, VI: ptosis not present, extra-ocular motions intact bilaterally  V,VII: smile asymmetric on the right, facial light touch sensation normal bilaterally  VIII: hearing normal bilaterally  IX,X: gag reflex present  XI: bilateral shoulder shrug  XII: midline tongue extension  Motor:  Right : Upper extremity 4/5 Left: Upper extremity 5/5  Lower extremity 4/5 Lower extremity 5/5  --Drift on the right UE and LE  Tone and bulk:normal tone  throughout; no atrophy noted  Sensory: Pinprick and light touch intact throughout, bilaterally  Deep Tendon Reflexes:  Right: Upper Extremity Left: Upper extremity  biceps (C-5 to C-6) 2/4 biceps (C-5 to C-6) 2/4  tricep (C7) 2/4 triceps (C7) 2/4  Brachioradialis (C6) 2/4 Brachioradialis (C6) 2/4  --brisk bilaterallyy  Lower Extremity Lower Extremity  quadriceps (L-2 to L-4) 2/4 quadriceps (L-2 to L-4) 2/4  Achilles (S1) 2/4 Achilles (S1) 2/4  Plantars:  Up going bilaterally  Cerebellar:  normal finger-to-nose, normal heel-to-shin test  Gait: not tested  CV: pulses palpable throughout    ASSESSMENT Ms. DELENE MORAIS is a 79 y.o. female presenting with dysarthria, right facial droop, right hemiparesis. Imaging confirms multifocal areas of acute infarction within the posterior circulation including the left greater than right mid to upper pons.  Infarct felt to be due to right vertebral  artery occlusion/high grade stenosis with distal embolization  On clopidogrel 75 mg orally every day prior to admission. Now on aspirin 81 mg orally every day and clopidogrel 75 mg orally every day for secondary stroke prevention. Patient with resultant dysarthria, right hemiparesis. Work up underway.   Dyslipidemia, LDL 23, at goal on statin  Hypertension  Small vessel disease  Hypoplastic right vertebral artery  HGB A1C 6.0  Hospital day # 3  TREATMENT/PLAN  Continue aspirin 81 mg orally every day and clopidogrel 75 mg orally every day for secondary stroke prevention. TEE positive for PFO (patent foramen ovale), LE negative for DVT. Patient had LOOP placed to evaluate for atrial fibrillation as etiology of stroke.  CIR recommended  Risk factor management  Discussed with Dr. Gari Crown D. Manson Passey, [facility], MBA, MHA [facility] Pager: [phone] 11/25/2012 3:51 PM  I have personally obtained a history, examined the patient, evaluated imaging results, and formulated the  assessment and plan of care. I agree with the above. Delia Heady, MD

## 2012-11-25 NOTE — Progress Notes (Signed)
Pt for discharge to [facility] today. IV D/C.  D/C instructions and Rx given with verbalized understanding.  Staff at bedside to assist with discharge, PTAR to transport.

## 2012-11-25 NOTE — Progress Notes (Signed)
Subjective: Still wants to go home.  Was up in halls with walker.  Explained that she will need some sort of rehab and unfortunately, CIR did not offer her a bed.  Her sister Vanessa Kick) has been at [facility] before and she is willing to consider rehabbing there as she is familiar with the facility.  Receiving instructions on her upcoming TEE this morning as well.  Objective: Vital signs in last 24 hours: Temp:  [97.8 F (36.6 C)-98.6 F (37 C)] 97.8 F (36.6 C) (10/03 0608) Pulse Rate:  [55-63] 55 (10/03 0608) Resp:  [18-20] 18 (10/03 0608) BP: (116-133)/(57-85) 131/67 mmHg (10/03 0608) SpO2:  [94 %-99 %] 97 % (10/03 1478) Weight change:  Last BM Date: 11/22/12  Intake/Output from previous day: 10/02 0701 - 10/03 0700 In: 460 [P.O.:460] Out: 1 [Urine:1] Intake/Output this shift:  General appearance: Alert, oriented, dysarthric speech.  Head: Normocephalic, band-aid on lip  Eyes: conjunctivae/corneas clear. PERRL, EOM's intact.  Nose: Nares normal. Septum midline. Mucosa normal. No drainage or sinus tenderness.  Neck: no adenopathy, no carotid bruit, no JVD and thyroid not enlarged, symmetric, no tenderness/mass/nodules  Resp: Clear bilaterally  Cardio: regular, no murmur.  GI: soft, non-tender; bowel sounds normal; no masses, no organomegaly  Extremities: extremities s edema  r side upper and Lower Ext 4/5 weakness noted.  Pulses: 2+ and symmetric  Lymph nodes: no cervical lymphadenopathy  Neurologic: Noted dysarthria, mild improvement, able to reach for food but coordination still poor, has not been out of bed.   Lab Results:  Recent Labs  11/22/12 1030 11/23/12 0600  WBC 6.2 5.6  HGB 12.2 11.9*  HCT 35.3* 34.2*  PLT 253 224   BMET  Recent Labs  11/22/12 1030 11/23/12 0600  NA 139 139  K 4.0 3.5  CL 105 104  CO2 24 25  GLUCOSE 107* 89  BUN 10 10  CREATININE 0.65 0.69  CALCIUM 8.9 8.5    Studies/Results: Ct Angio Head W/cm &/or Wo  Cm  11/23/2012   CLINICAL DATA:  Brainstem stroke. Evaluate intracranial circulation.  EXAM: CT ANGIOGRAPHY HEAD AND NECK  TECHNIQUE: Multidetector CT imaging of the head and neck was performed using the standard protocol during bolus administration of intravenous contrast. Multiplanar CT image reconstructions including MIPs were obtained to evaluate the vascular anatomy. Carotid stenosis measurements (when applicable) are obtained utilizing NASCET criteria, using the distal internal carotid diameter as the denominator.  CONTRAST:  Omnipaque 350, 50 mL.  COMPARISON:  MRI and MRA 11/22/2012.  FINDINGS: CTA HEAD FINDINGS  Non stenotic calcification affects the cavernous and supraclinoid internal carotid arteries bilaterally. ICA termini are widely patent.  Mild mural irregularity of the proximal basilar just below the origin of the AICA (arrows, image 20 series [id number]). There is no basilar dissection, and no flow-limiting stenosis.  Fetal origin right PCA. Early takeoff M2 branch left MCA. No M1 MCA stenosis or significant irregularity. No flow-limiting intracranial stenosis or aneurysm.  Compared with the MRA intracranial, the degree of basilar stenosis on MRA was overestimated. There does not appear to be a significant intrinsic posterior circulation stenosis contributing to the observed brainstem infarct. 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  Re-demonstrated is atrophy with chronic microvascular ischemic change. Developing cytotoxic edema in the left pontine infarct. No abnormal postcontrast enhancement. Calvarium intact. Large retention cyst left maxillary sinus.  Review of the MIP images confirms the above  findings.  CTA NECK FINDINGS  No lung apex nodule. No neck masses. Cervical spondylosis.  Heavily calcified transverse arch. Conventional branching. Great vessel origins partially excluded with no definite  proximal flow limiting lesion. Mural calcification without significant reduction of the luminal diameter can be seen in the innominate, proximal left common carotid, and proximal left subclavian.  Heavily calcified non stenotic atheromatous change right carotid bifurcation. No measurable stenosis with ratio of 4.0/ 4.2 proximal/distal. Similarly, heavily calcified, but non stenotic atheromatous change left carotid bifurcation. No measurable internal carotid artery stenosis with a ratio of 3.9/4.4 proximal/distal. No evidence for cervical ICA fibromuscular dysplasia or dissection.  The left vertebral is the dominant contributor to the basilar. There is no ostial stenosis but a small amount of calcification is present at the left vertebral origin. Due to extreme tortuosity, there is remodeling of the foramen transversarium on the left at C4. No fibromuscular dysplasia or dissection. No intracranial stenosis.  The right vertebral is small throughout its course. There is no ostial stenosis. There is slight reduction in caliber distal to the right PICA origin which may be physiologic.  Review of the MIP images confirms the above findings.  IMPRESSION: CTA HEAD IMPRESSION  Mild irregularity of the proximal basilar without flow limiting stenosis or dissection.  Mild non stenotic irregularity both carotid siphons without flow limiting intracranial stenosis of the anterior circulation.  CTA NECK IMPRESSION  Heavily calcified but non stenotic bilateral carotid bifurcations. No evidence for carotid fibromuscular dysplasia or dissection.  Left vertebral is the dominant contributor to the posterior circulation and is free of significant disease.  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   Electronically Signed   By: Davonna Belling M.D.   On: 11/23/2012 19:31   Ct Angio Neck W/cm &/or Wo/cm  11/23/2012   CLINICAL DATA:  Brainstem stroke. Evaluate intracranial circulation.  EXAM: CT  ANGIOGRAPHY HEAD AND NECK  TECHNIQUE: Multidetector CT imaging of the head and neck was performed using the standard protocol during bolus administration of intravenous contrast. Multiplanar CT image reconstructions including MIPs were obtained to evaluate the vascular anatomy. Carotid stenosis measurements (when applicable) are obtained utilizing NASCET criteria, using the distal internal carotid diameter as the denominator.  CONTRAST:  Omnipaque 350, 50 mL.  COMPARISON:  MRI and MRA 11/22/2012.  FINDINGS: CTA HEAD FINDINGS  Non stenotic calcification affects the cavernous and supraclinoid internal carotid arteries bilaterally. ICA termini are widely patent.  Mild mural irregularity of the proximal basilar just below the origin of the AICA (arrows, image 20 series [id number]). There is no basilar dissection, and no flow-limiting stenosis.  Fetal origin right PCA. Early takeoff M2 branch left MCA. No M1 MCA stenosis or significant irregularity. No flow-limiting intracranial stenosis or aneurysm.  Compared with the MRA intracranial, the degree of basilar stenosis on MRA was overestimated. There does not appear to be a significant intrinsic posterior circulation stenosis contributing to the observed brainstem infarct. 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  Re-demonstrated is atrophy with chronic microvascular ischemic change. Developing cytotoxic edema in the left pontine infarct. No abnormal postcontrast enhancement. Calvarium intact. Large retention cyst left maxillary sinus.  Review of the MIP images confirms the above findings.  CTA NECK FINDINGS  No lung apex nodule. No neck masses. Cervical spondylosis.  Heavily calcified transverse arch. Conventional branching. Great vessel origins partially excluded with no definite proximal flow  limiting lesion. Mural calcification without significant reduction of the luminal  diameter can be seen in the innominate, proximal left common carotid, and proximal left subclavian.  Heavily calcified non stenotic atheromatous change right carotid bifurcation. No measurable stenosis with ratio of 4.0/ 4.2 proximal/distal. Similarly, heavily calcified, but non stenotic atheromatous change left carotid bifurcation. No measurable internal carotid artery stenosis with a ratio of 3.9/4.4 proximal/distal. No evidence for cervical ICA fibromuscular dysplasia or dissection.  The left vertebral is the dominant contributor to the basilar. There is no ostial stenosis but a small amount of calcification is present at the left vertebral origin. Due to extreme tortuosity, there is remodeling of the foramen transversarium on the left at C4. No fibromuscular dysplasia or dissection. No intracranial stenosis.  The right vertebral is small throughout its course. There is no ostial stenosis. There is slight reduction in caliber distal to the right PICA origin which may be physiologic.  Review of the MIP images confirms the above findings.  IMPRESSION: CTA HEAD IMPRESSION  Mild irregularity of the proximal basilar without flow limiting stenosis or dissection.  Mild non stenotic irregularity both carotid siphons without flow limiting intracranial stenosis of the anterior circulation.  CTA NECK IMPRESSION  Heavily calcified but non stenotic bilateral carotid bifurcations. No evidence for carotid fibromuscular dysplasia or dissection.  Left vertebral is the dominant contributor to the posterior circulation and is free of significant disease.  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   Electronically Signed   By: Davonna Belling M.D.   On: 11/23/2012 19:31    Medications:  I have reviewed the patient's current medications. Scheduled: . aspirin EC  81 mg Oral Daily  . calcium carbonate  1 tablet Oral Daily  . chlorhexidine  60 mL Topical Once  . clopidogrel  75 mg Oral  Daily  . enoxaparin (LOVENOX) injection  40 mg Subcutaneous Q24H  . losartan  100 mg Oral Daily  . simvastatin  20 mg Oral QHS  . sodium chloride  3 mL Intravenous Q12H   Continuous: . sodium chloride     ZOX:WRUEAV chloride, acetaminophen, acetaminophen, feeding supplement, sodium chloride  Assessment/Plan: Posterior circulation CVA based on symptoms. Continuing antiplatelets, further workup underway per Stroke service including TEE and implantable rhythm monitor to look for Afib HTN- Continue Losartan  Lipids- Simvastatin.   Dispo- Cannot return home as nobody is able to watch after her.  Despite her wanting to go home, she understands that her best chance to get stronger is going to be PT/OT/Speech therapy at a facility until she is safe enough to get home.   [facility] as her sister was there and is familiar with facility.  Social work needs to get an FL-2 on the chart.  Dr. Jacky Kindle is covering the upcoming weekend and he is the medical director at [facility] so this can likely be facilitated tomorrow if her TEE does not show a PFO and there is no change in therapy.    LOS: 3 days   Jadea Shiffer W 11/25/2012, 7:55 AM

## 2012-11-25 NOTE — Interval H&P Note (Signed)
History and Physical Interval Note:  11/25/2012 10:10 AM  Jane Farley  has presented today for surgery, with the diagnosis of stroke  The various methods of treatment have been discussed with the patient and family. After consideration of risks, benefits and other options for treatment, the patient has consented to  Procedure(s): TRANSESOPHAGEAL ECHOCARDIOGRAM (TEE) (N/A) as a surgical intervention .  The patient's history has been reviewed, patient examined, no change in status, stable for surgery.  I have reviewed the patient's chart and labs.  Questions were answered to the patient's satisfaction.     Elyn Aquas.

## 2012-11-25 NOTE — CV Procedure (Signed)
    Transesophageal Echocardiogram Note  Jane Farley [id number] February 09, 1933  Procedure: Transesophageal Echocardiogram Indications: CVA  Procedure Details Consent: Obtained Time Out: Verified patient identification, verified procedure, site/side was marked, verified correct patient position, special equipment/implants available, Radiology Safety Procedures followed,  medications/allergies/relevent history reviewed, required imaging and test results available.  Performed  Medications: Fentanyl: 50 mcg IV Versed: 4 mg IV  Left Ventrical:  Normal LV function  Mitral Valve: trace - mild MR  Aortic Valve: normal AV  Tricuspid Valve: mild TR,   Pulmonic Valve: trivial PI  Left Atrium/ Left atrial appendage: small,  No thrombi  Atrial septum: + PFO by bubble contrast  Aorta: mild calcification   Complications: No apparent complications Patient did tolerate procedure well.   [name], Montez Hageman., MD, [facility] 11/25/2012, 10:26 AM

## 2012-11-25 NOTE — H&P (View-Only) (Signed)
 ELECTROPHYSIOLOGY CONSULT NOTE  Patient ID: Jane Farley MRN: [id number], DOB/AGE: 77/20/1934   Admit date: 11/22/2012 Date of Consult: 11/24/2012  Primary Physician: Tisovec, MD Primary Cardiologist: None Reason for Consultation: Cryptogenic stroke; recommendations regarding Implantable Loop Recorder  History of Present Illness Jane Farley was admitted on 11/22/2012 with acute CVA. She has been monitored on telemetry which has demonstrated no arrhythmias. No cause has been identified. Inpatient stroke work-up is to be completed with a TEE. EP has been asked to evaluate for placement of an implantable loop recorder to monitor for atrial fibrillation.  Past Medical History Past Medical History  Diagnosis Date  . Hemorrhoids   . Nephrolithiasis   . Hypercholesterolemia   . Hypertension   . Osteoporosis   . Stroke     Past Surgical History Past Surgical History  Procedure Date  . Breast lumpectomy 02/23/1965  . Exploratory laparotomy with abdominal mass excision 12/08/2010    Allergies/Intolerances No Known Allergies  Inpatient Medications . aspirin EC  81 mg Oral Daily  . calcium carbonate  1 tablet Oral Daily  . clopidogrel  75 mg Oral Daily  . enoxaparin (LOVENOX) injection  40 mg Subcutaneous Q24H  . losartan  100 mg Oral Daily  . simvastatin  20 mg Oral QHS  . sodium chloride  3 mL Intravenous Q12H    Social History Social History  . Marital Status: Widowed   Social History Main Topics  . Smoking status: Former Smoker    Quit date: 03/04/2002  . Smokeless tobacco: Never Used  . Alcohol Use: No  . Drug Use: No   Review of Systems General: No chills, fever, night sweats or weight changes  Cardiovascular:  No chest pain, dyspnea on exertion, edema, orthopnea, palpitations, paroxysmal nocturnal dyspnea Dermatological: No rash, lesions or masses Respiratory: No cough, dyspnea Urologic: No hematuria, dysuria Abdominal: No nausea, vomiting, diarrhea, bright  red blood per rectum, melena, or hematemesis Neurologic: No visual changes, weakness, changes in mental status All other systems reviewed and are otherwise negative except as noted above.  Physical Exam Blood pressure 116/57, pulse 63, temperature 98.2 F (36.8 C), temperature source Oral, resp. rate 18, height 5' 2" (1.575 m), weight 129 lb (58.514 kg), SpO2 94.00%.  General: Well developed, well appearing 79 y.o. female in no acute distress. HEENT: Normocephalic, atraumatic. EOMs intact. Sclera nonicteric. Oropharynx clear.  Neck: Supple. No JVD. Lungs: Respirations regular and unlabored, CTA bilaterally. No wheezes, rales or rhonchi. Heart: RRR. S1, S2 present. No murmurs, rub, S3 or S4. Abdomen: Soft, non-tender, non-distended. BS present x 4 quadrants. No hepatosplenomegaly.  Extremities: No clubbing, cyanosis or edema. DP/PT/Radials 2+ and equal bilaterally. Psych: Normal affect. Neuro: Alert and oriented X 3. Moves all extremities spontaneously. Musculoskeletal: No kyphosis. Skin: Intact. Warm and dry. No rashes or petechiae in exposed areas.   Labs Lab Results  Component Value Date   WBC 5.6 11/23/2012   HGB 11.9* 11/23/2012   HCT 34.2* 11/23/2012   MCV 87.9 11/23/2012   PLT 224 11/23/2012     Recent Labs Lab 11/23/12 0600  NA 139  K 3.5  CL 104  CO2 25  BUN 10  CREATININE 0.69  CALCIUM 8.5  PROT 6.4  BILITOT 0.5  ALKPHOS 47  ALT 7  AST 11  GLUCOSE 89    Recent Labs  11/22/12 1217  INR 0.94    Radiology/Studies Ct Angio Head W/cm &/or Wo Cm 11/23/2012   IMPRESSION: CTA HEAD IMPRESSION  Mild irregularity   of the proximal basilar without flow limiting stenosis or dissection.  Mild non stenotic irregularity both carotid siphons without flow limiting intracranial stenosis of the anterior circulation. CTA NECK IMPRESSION  Heavily calcified but non stenotic bilateral carotid bifurcations. No evidence for carotid fibromuscular dysplasia or dissection.  Left vertebral  is the dominant contributor to the posterior circulation and is free of significant disease.  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   Electronically Signed   By: John  Curnes M.D.   On: 11/23/2012 19:31   Ct Angio Neck W/cm &/or Wo/cm 11/23/2012   IMPRESSION: CTA HEAD IMPRESSION  Mild irregularity of the proximal basilar without flow limiting stenosis or dissection.  Mild non stenotic irregularity both carotid siphons without flow limiting intracranial stenosis of the anterior circulation. CTA NECK IMPRESSION  Heavily calcified but non stenotic bilateral carotid bifurcations. No evidence for carotid fibromuscular dysplasia or dissection.  Left vertebral is the dominant contributor to the posterior circulation and is free of significant disease.  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    Electronically Signed   By: John  Curnes M.D.   On: 11/23/2012 19:31   Mr Mra Head Wo Contrast 11/22/2012      IMPRESSION: MRI HEAD IMPRESSION  Multifocal areas of acute infarction within the posterior circulation. The area of most significant involvement includes the left greater than right mid to upper pons. Atrophy and chronic microvascular ischemic change. Findings discussed with ordering provider.  MRA HEAD IMPRESSION  Mild non stenotic irregularity of the proximal basilar artery and both supraclinoid internal carotid arteries. No flow-limiting basilar stenosis. Severe irregularity distal right vertebral.    Electronically Signed   By: John  Curnes M.D.   On: 11/22/2012 13:43   Echocardiogram  Study Conclusions - Left ventricle: The cavity size was normal. Wall thickness was increased in a pattern of mild LVH. Systolic function was normal. The estimated ejection fraction was in the range of 55% to 60%. Wall motion was normal; there were no regional wall motion abnormalities. - Aortic valve: Calcified non coronary  cusp - Left atrium: The atrium was mildly dilated. - Atrial septum: No defect or patent foramen ovale was identified.  12-lead ECG on admission shows SR Telemetry shows SR with rare PACs   Assessment and Plan 1. Cryptogenic stroke  If the TEE is negative, we recommend loop recorder insertion to monitor for AF. The indication for loop recorder insertion / monitoring for AF in setting of cryptogenic stroke was discussed with the patient. The loop recorder insertion procedure was reviewed in detail including risks and benefits. These risks include but are not limited to bleeding and infection. The patient expressed verbal understanding and agrees to proceed. The patient was also counseled regarding wound care and device follow-up.  Dr. Kadian Barcellos to see Signed, EDMISTEN, BROOKE 11/24/2012, 3:41 PM    I have seen, examined the patient, and reviewed the above assessment and plan.  Changes to above are made where necessary.  Assessment and Plan:  1. Cryptogenic stroke The patient presents with cryptogenic stroke.  The patient has a TEE planned for this AM.  I spoke at length with the patient about monitoring for afib with an implantable loop recorder.  Risks, benefits, and alteratives to implantable loop recorder were discussed with the patient today.   At this time, the patient is very clear in their decision to proceed with implantable loop recorder if TEE is negative in the am..     Please call with questions.   Co Sign: Arley Garant, MD 11/24/2012 6:49 PM   

## 2012-11-25 NOTE — H&P (View-Only) (Signed)
Subjective: Still wants to go home.  Was up in halls with walker.  Explained that she will need some sort of rehab and unfortunately, [facility] did not offer her a bed.  Her sister (Kathleen Gregory) has been at [facility] before and she is willing to consider rehabbing there as she is familiar with the facility.  Receiving instructions on her upcoming TEE this morning as well.  Objective: Vital signs in last 24 hours: Temp:  [97.8 F (36.6 C)-98.6 F (37 C)] 97.8 F (36.6 C) (10/03 0608) Pulse Rate:  [55-63] 55 (10/03 0608) Resp:  [18-20] 18 (10/03 0608) BP: (116-133)/(57-85) 131/67 mmHg (10/03 0608) SpO2:  [94 %-99 %] 97 % (10/03 0608) Weight change:  Last BM Date: 11/22/12  Intake/Output from previous day: 10/02 0701 - 10/03 0700 In: 460 [P.O.:460] Out: 1 [Urine:1] Intake/Output this shift:  General appearance: Alert, oriented, dysarthric speech.  Head: Normocephalic, band-aid on lip  Eyes: conjunctivae/corneas clear. PERRL, EOM's intact.  Nose: Nares normal. Septum midline. Mucosa normal. No drainage or sinus tenderness.  Neck: no adenopathy, no carotid bruit, no JVD and thyroid not enlarged, symmetric, no tenderness/mass/nodules  Resp: Clear bilaterally  Cardio: regular, no murmur.  GI: soft, non-tender; bowel sounds normal; no masses, no organomegaly  Extremities: extremities s edema  r side upper and Lower Ext 4/5 weakness noted.  Pulses: 2+ and symmetric  Lymph nodes: no cervical lymphadenopathy  Neurologic: Noted dysarthria, mild improvement, able to reach for food but coordination still poor, has not been out of bed.   Lab Results:  Recent Labs  11/22/12 1030 11/23/12 0600  WBC 6.2 5.6  HGB 12.2 11.9*  HCT 35.3* 34.2*  PLT 253 224   BMET  Recent Labs  11/22/12 1030 11/23/12 0600  NA 139 139  K 4.0 3.5  CL 105 104  CO2 24 25  GLUCOSE 107* 89  BUN 10 10  CREATININE 0.65 0.69  CALCIUM 8.9 8.5    Studies/Results: Ct Angio Head W/cm &/or Wo  Cm  11/23/2012   CLINICAL DATA:  Brainstem stroke. Evaluate intracranial circulation.  EXAM: CT ANGIOGRAPHY HEAD AND NECK  TECHNIQUE: Multidetector CT imaging of the head and neck was performed using the standard protocol during bolus administration of intravenous contrast. Multiplanar CT image reconstructions including MIPs were obtained to evaluate the vascular anatomy. Carotid stenosis measurements (when applicable) are obtained utilizing NASCET criteria, using the distal internal carotid diameter as the denominator.  CONTRAST:  Omnipaque 350, 50 mL.  COMPARISON:  MRI and MRA 11/22/2012.  FINDINGS: CTA HEAD FINDINGS  Non stenotic calcification affects the cavernous and supraclinoid internal carotid arteries bilaterally. ICA termini are widely patent.  Mild mural irregularity of the proximal basilar just below the origin of the AICA (arrows, image 20 series [id number]). There is no basilar dissection, and no flow-limiting stenosis.  Fetal origin right PCA. Early takeoff M2 branch left MCA. No M1 MCA stenosis or significant irregularity. No flow-limiting intracranial stenosis or aneurysm.  Compared with the MRA intracranial, the degree of basilar stenosis on MRA was overestimated. There does not appear to be a significant intrinsic posterior circulation stenosis contributing to the observed brainstem infarct. 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  Re-demonstrated is atrophy with chronic microvascular ischemic change. Developing cytotoxic edema in the left pontine infarct. No abnormal postcontrast enhancement. Calvarium intact. Large retention cyst left maxillary sinus.  Review of the MIP images confirms the above   findings.  CTA NECK FINDINGS  No lung apex nodule. No neck masses. Cervical spondylosis.  Heavily calcified transverse arch. Conventional branching. Great vessel origins partially excluded with no definite  proximal flow limiting lesion. Mural calcification without significant reduction of the luminal diameter can be seen in the innominate, proximal left common carotid, and proximal left subclavian.  Heavily calcified non stenotic atheromatous change right carotid bifurcation. No measurable stenosis with ratio of 4.0/ 4.2 proximal/distal. Similarly, heavily calcified, but non stenotic atheromatous change left carotid bifurcation. No measurable internal carotid artery stenosis with a ratio of 3.9/4.4 proximal/distal. No evidence for cervical ICA fibromuscular dysplasia or dissection.  The left vertebral is the dominant contributor to the basilar. There is no ostial stenosis but a small amount of calcification is present at the left vertebral origin. Due to extreme tortuosity, there is remodeling of the foramen transversarium on the left at C4. No fibromuscular dysplasia or dissection. No intracranial stenosis.  The right vertebral is small throughout its course. There is no ostial stenosis. There is slight reduction in caliber distal to the right PICA origin which may be physiologic.  Review of the MIP images confirms the above findings.  IMPRESSION: CTA HEAD IMPRESSION  Mild irregularity of the proximal basilar without flow limiting stenosis or dissection.  Mild non stenotic irregularity both carotid siphons without flow limiting intracranial stenosis of the anterior circulation.  CTA NECK IMPRESSION  Heavily calcified but non stenotic bilateral carotid bifurcations. No evidence for carotid fibromuscular dysplasia or dissection.  Left vertebral is the dominant contributor to the posterior circulation and is free of significant disease.  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   Electronically Signed   By: John  Curnes M.D.   On: 11/23/2012 19:31   Ct Angio Neck W/cm &/or Wo/cm  11/23/2012   CLINICAL DATA:  Brainstem stroke. Evaluate intracranial circulation.  EXAM: CT  ANGIOGRAPHY HEAD AND NECK  TECHNIQUE: Multidetector CT imaging of the head and neck was performed using the standard protocol during bolus administration of intravenous contrast. Multiplanar CT image reconstructions including MIPs were obtained to evaluate the vascular anatomy. Carotid stenosis measurements (when applicable) are obtained utilizing NASCET criteria, using the distal internal carotid diameter as the denominator.  CONTRAST:  Omnipaque 350, 50 mL.  COMPARISON:  MRI and MRA 11/22/2012.  FINDINGS: CTA HEAD FINDINGS  Non stenotic calcification affects the cavernous and supraclinoid internal carotid arteries bilaterally. ICA termini are widely patent.  Mild mural irregularity of the proximal basilar just below the origin of the AICA (arrows, image 20 series [id number]). There is no basilar dissection, and no flow-limiting stenosis.  Fetal origin right PCA. Early takeoff M2 branch left MCA. No M1 MCA stenosis or significant irregularity. No flow-limiting intracranial stenosis or aneurysm.  Compared with the MRA intracranial, the degree of basilar stenosis on MRA was overestimated. There does not appear to be a significant intrinsic posterior circulation stenosis contributing to the observed brainstem infarct. 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  Re-demonstrated is atrophy with chronic microvascular ischemic change. Developing cytotoxic edema in the left pontine infarct. No abnormal postcontrast enhancement. Calvarium intact. Large retention cyst left maxillary sinus.  Review of the MIP images confirms the above findings.  CTA NECK FINDINGS  No lung apex nodule. No neck masses. Cervical spondylosis.  Heavily calcified transverse arch. Conventional branching. Great vessel origins partially excluded with no definite proximal flow   limiting lesion. Mural calcification without significant reduction of the luminal  diameter can be seen in the innominate, proximal left common carotid, and proximal left subclavian.  Heavily calcified non stenotic atheromatous change right carotid bifurcation. No measurable stenosis with ratio of 4.0/ 4.2 proximal/distal. Similarly, heavily calcified, but non stenotic atheromatous change left carotid bifurcation. No measurable internal carotid artery stenosis with a ratio of 3.9/4.4 proximal/distal. No evidence for cervical ICA fibromuscular dysplasia or dissection.  The left vertebral is the dominant contributor to the basilar. There is no ostial stenosis but a small amount of calcification is present at the left vertebral origin. Due to extreme tortuosity, there is remodeling of the foramen transversarium on the left at C4. No fibromuscular dysplasia or dissection. No intracranial stenosis.  The right vertebral is small throughout its course. There is no ostial stenosis. There is slight reduction in caliber distal to the right PICA origin which may be physiologic.  Review of the MIP images confirms the above findings.  IMPRESSION: CTA HEAD IMPRESSION  Mild irregularity of the proximal basilar without flow limiting stenosis or dissection.  Mild non stenotic irregularity both carotid siphons without flow limiting intracranial stenosis of the anterior circulation.  CTA NECK IMPRESSION  Heavily calcified but non stenotic bilateral carotid bifurcations. No evidence for carotid fibromuscular dysplasia or dissection.  Left vertebral is the dominant contributor to the posterior circulation and is free of significant disease.  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   Electronically Signed   By: John  Curnes M.D.   On: 11/23/2012 19:31    Medications:  I have reviewed the patient's current medications. Scheduled: . aspirin EC  81 mg Oral Daily  . calcium carbonate  1 tablet Oral Daily  . chlorhexidine  60 mL Topical Once  . clopidogrel  75 mg Oral  Daily  . enoxaparin (LOVENOX) injection  40 mg Subcutaneous Q24H  . losartan  100 mg Oral Daily  . simvastatin  20 mg Oral QHS  . sodium chloride  3 mL Intravenous Q12H   Continuous: . sodium chloride     PRN:sodium chloride, acetaminophen, acetaminophen, feeding supplement, sodium chloride  Assessment/Plan: Posterior circulation CVA based on symptoms. Continuing antiplatelets, further workup underway per Stroke service including TEE and implantable rhythm monitor to look for Afib HTN- Continue Losartan  Lipids- Simvastatin.   Dispo- Cannot return home as nobody is able to watch after her.  Despite her wanting to go home, she understands that her best chance to get stronger is going to be PT/OT/Speech therapy at a facility until she is safe enough to get home.   [facility] preferred as her sister was there and is familiar with facility.  Social work needs to get an FL-2 on the chart.  Dr. Aronson is covering the upcoming weekend and he is the medical director at [facility] so this can likely be facilitated tomorrow if her TEE does not show a PFO and there is no change in therapy.    LOS: 3 days   Waymond Meador W 11/25/2012, 7:55 AM   

## 2012-11-26 DIAGNOSIS — M6281 Muscle weakness (generalized): Secondary | ICD-10-CM | POA: Diagnosis not present

## 2012-11-26 DIAGNOSIS — I10 Essential (primary) hypertension: Secondary | ICD-10-CM | POA: Diagnosis not present

## 2012-11-26 DIAGNOSIS — I6350 Cerebral infarction due to unspecified occlusion or stenosis of unspecified cerebral artery: Secondary | ICD-10-CM | POA: Diagnosis not present

## 2012-11-26 DIAGNOSIS — I69998 Other sequelae following unspecified cerebrovascular disease: Secondary | ICD-10-CM | POA: Diagnosis not present

## 2012-11-26 DIAGNOSIS — R279 Unspecified lack of coordination: Secondary | ICD-10-CM | POA: Diagnosis not present

## 2012-11-26 DIAGNOSIS — I69922 Dysarthria following unspecified cerebrovascular disease: Secondary | ICD-10-CM | POA: Diagnosis not present

## 2012-11-26 DIAGNOSIS — I6990 Unspecified sequelae of unspecified cerebrovascular disease: Secondary | ICD-10-CM | POA: Diagnosis not present

## 2012-11-26 DIAGNOSIS — E7800 Pure hypercholesterolemia, unspecified: Secondary | ICD-10-CM | POA: Diagnosis not present

## 2012-11-26 DIAGNOSIS — F329 Major depressive disorder, single episode, unspecified: Secondary | ICD-10-CM | POA: Diagnosis not present

## 2012-11-26 DIAGNOSIS — Z5189 Encounter for other specified aftercare: Secondary | ICD-10-CM | POA: Diagnosis not present

## 2012-11-26 DIAGNOSIS — J069 Acute upper respiratory infection, unspecified: Secondary | ICD-10-CM | POA: Diagnosis not present

## 2012-11-26 DIAGNOSIS — I69920 Aphasia following unspecified cerebrovascular disease: Secondary | ICD-10-CM | POA: Diagnosis not present

## 2012-11-27 DIAGNOSIS — F329 Major depressive disorder, single episode, unspecified: Secondary | ICD-10-CM | POA: Diagnosis not present

## 2012-11-27 DIAGNOSIS — I6990 Unspecified sequelae of unspecified cerebrovascular disease: Secondary | ICD-10-CM | POA: Diagnosis not present

## 2012-11-27 DIAGNOSIS — I10 Essential (primary) hypertension: Secondary | ICD-10-CM | POA: Diagnosis not present

## 2012-11-28 ENCOUNTER — Encounter (HOSPITAL_COMMUNITY): Payer: Self-pay | Admitting: Cardiovascular Disease

## 2013-01-03 ENCOUNTER — Telehealth: Payer: Self-pay | Admitting: Internal Medicine

## 2013-01-03 ENCOUNTER — Encounter: Payer: Self-pay | Admitting: Internal Medicine

## 2013-01-03 NOTE — Telephone Encounter (Signed)
12-05-12 lmm @ 1010am to make appt for loop recorder wound check (pacer)/mt 01-03-13 sent past due letter/mt

## 2013-01-05 DIAGNOSIS — I6990 Unspecified sequelae of unspecified cerebrovascular disease: Secondary | ICD-10-CM | POA: Diagnosis not present

## 2013-02-03 DIAGNOSIS — I6990 Unspecified sequelae of unspecified cerebrovascular disease: Secondary | ICD-10-CM | POA: Diagnosis not present

## 2013-02-13 DIAGNOSIS — J069 Acute upper respiratory infection, unspecified: Secondary | ICD-10-CM | POA: Diagnosis not present

## 2013-02-23 DIAGNOSIS — R279 Unspecified lack of coordination: Secondary | ICD-10-CM | POA: Diagnosis not present

## 2013-02-23 DIAGNOSIS — F329 Major depressive disorder, single episode, unspecified: Secondary | ICD-10-CM | POA: Diagnosis not present

## 2013-02-23 DIAGNOSIS — Z5189 Encounter for other specified aftercare: Secondary | ICD-10-CM | POA: Diagnosis not present

## 2013-02-23 DIAGNOSIS — M6281 Muscle weakness (generalized): Secondary | ICD-10-CM | POA: Diagnosis not present

## 2013-02-23 DIAGNOSIS — I10 Essential (primary) hypertension: Secondary | ICD-10-CM | POA: Diagnosis not present

## 2013-02-23 DIAGNOSIS — E7800 Pure hypercholesterolemia, unspecified: Secondary | ICD-10-CM | POA: Diagnosis not present

## 2013-02-23 DIAGNOSIS — I69922 Dysarthria following unspecified cerebrovascular disease: Secondary | ICD-10-CM | POA: Diagnosis not present

## 2013-02-23 DIAGNOSIS — I69920 Aphasia following unspecified cerebrovascular disease: Secondary | ICD-10-CM | POA: Diagnosis not present

## 2013-03-06 DIAGNOSIS — I69939 Monoplegia of upper limb following unspecified cerebrovascular disease affecting unspecified side: Secondary | ICD-10-CM | POA: Diagnosis not present

## 2013-03-06 DIAGNOSIS — I10 Essential (primary) hypertension: Secondary | ICD-10-CM | POA: Diagnosis not present

## 2013-03-06 DIAGNOSIS — F3289 Other specified depressive episodes: Secondary | ICD-10-CM | POA: Diagnosis not present

## 2013-03-06 DIAGNOSIS — I69921 Dysphasia following unspecified cerebrovascular disease: Secondary | ICD-10-CM | POA: Diagnosis not present

## 2013-03-06 DIAGNOSIS — E7800 Pure hypercholesterolemia, unspecified: Secondary | ICD-10-CM | POA: Diagnosis not present

## 2013-03-06 DIAGNOSIS — I69993 Ataxia following unspecified cerebrovascular disease: Secondary | ICD-10-CM | POA: Diagnosis not present

## 2013-03-06 DIAGNOSIS — R4701 Aphasia: Secondary | ICD-10-CM | POA: Diagnosis not present

## 2013-03-06 DIAGNOSIS — F329 Major depressive disorder, single episode, unspecified: Secondary | ICD-10-CM | POA: Diagnosis not present

## 2013-03-07 DIAGNOSIS — E7800 Pure hypercholesterolemia, unspecified: Secondary | ICD-10-CM | POA: Diagnosis not present

## 2013-03-07 DIAGNOSIS — I69921 Dysphasia following unspecified cerebrovascular disease: Secondary | ICD-10-CM | POA: Diagnosis not present

## 2013-03-07 DIAGNOSIS — I69939 Monoplegia of upper limb following unspecified cerebrovascular disease affecting unspecified side: Secondary | ICD-10-CM | POA: Diagnosis not present

## 2013-03-07 DIAGNOSIS — I10 Essential (primary) hypertension: Secondary | ICD-10-CM | POA: Diagnosis not present

## 2013-03-07 DIAGNOSIS — R4701 Aphasia: Secondary | ICD-10-CM | POA: Diagnosis not present

## 2013-03-07 DIAGNOSIS — I69993 Ataxia following unspecified cerebrovascular disease: Secondary | ICD-10-CM | POA: Diagnosis not present

## 2013-03-08 DIAGNOSIS — I10 Essential (primary) hypertension: Secondary | ICD-10-CM | POA: Diagnosis not present

## 2013-03-08 DIAGNOSIS — I69939 Monoplegia of upper limb following unspecified cerebrovascular disease affecting unspecified side: Secondary | ICD-10-CM | POA: Diagnosis not present

## 2013-03-08 DIAGNOSIS — R4701 Aphasia: Secondary | ICD-10-CM | POA: Diagnosis not present

## 2013-03-08 DIAGNOSIS — I69993 Ataxia following unspecified cerebrovascular disease: Secondary | ICD-10-CM | POA: Diagnosis not present

## 2013-03-08 DIAGNOSIS — E7800 Pure hypercholesterolemia, unspecified: Secondary | ICD-10-CM | POA: Diagnosis not present

## 2013-03-08 DIAGNOSIS — I69921 Dysphasia following unspecified cerebrovascular disease: Secondary | ICD-10-CM | POA: Diagnosis not present

## 2013-03-09 DIAGNOSIS — E7800 Pure hypercholesterolemia, unspecified: Secondary | ICD-10-CM | POA: Diagnosis not present

## 2013-03-09 DIAGNOSIS — I69939 Monoplegia of upper limb following unspecified cerebrovascular disease affecting unspecified side: Secondary | ICD-10-CM | POA: Diagnosis not present

## 2013-03-09 DIAGNOSIS — I69921 Dysphasia following unspecified cerebrovascular disease: Secondary | ICD-10-CM | POA: Diagnosis not present

## 2013-03-09 DIAGNOSIS — R4701 Aphasia: Secondary | ICD-10-CM | POA: Diagnosis not present

## 2013-03-09 DIAGNOSIS — I10 Essential (primary) hypertension: Secondary | ICD-10-CM | POA: Diagnosis not present

## 2013-03-09 DIAGNOSIS — I69993 Ataxia following unspecified cerebrovascular disease: Secondary | ICD-10-CM | POA: Diagnosis not present

## 2013-03-13 DIAGNOSIS — I69939 Monoplegia of upper limb following unspecified cerebrovascular disease affecting unspecified side: Secondary | ICD-10-CM | POA: Diagnosis not present

## 2013-03-13 DIAGNOSIS — I69921 Dysphasia following unspecified cerebrovascular disease: Secondary | ICD-10-CM | POA: Diagnosis not present

## 2013-03-13 DIAGNOSIS — I10 Essential (primary) hypertension: Secondary | ICD-10-CM | POA: Diagnosis not present

## 2013-03-13 DIAGNOSIS — E7800 Pure hypercholesterolemia, unspecified: Secondary | ICD-10-CM | POA: Diagnosis not present

## 2013-03-13 DIAGNOSIS — R4701 Aphasia: Secondary | ICD-10-CM | POA: Diagnosis not present

## 2013-03-13 DIAGNOSIS — I69993 Ataxia following unspecified cerebrovascular disease: Secondary | ICD-10-CM | POA: Diagnosis not present

## 2013-03-14 DIAGNOSIS — R4701 Aphasia: Secondary | ICD-10-CM | POA: Diagnosis not present

## 2013-03-14 DIAGNOSIS — I10 Essential (primary) hypertension: Secondary | ICD-10-CM | POA: Diagnosis not present

## 2013-03-14 DIAGNOSIS — I69921 Dysphasia following unspecified cerebrovascular disease: Secondary | ICD-10-CM | POA: Diagnosis not present

## 2013-03-14 DIAGNOSIS — E7800 Pure hypercholesterolemia, unspecified: Secondary | ICD-10-CM | POA: Diagnosis not present

## 2013-03-14 DIAGNOSIS — I69993 Ataxia following unspecified cerebrovascular disease: Secondary | ICD-10-CM | POA: Diagnosis not present

## 2013-03-14 DIAGNOSIS — I69939 Monoplegia of upper limb following unspecified cerebrovascular disease affecting unspecified side: Secondary | ICD-10-CM | POA: Diagnosis not present

## 2013-03-15 DIAGNOSIS — I69921 Dysphasia following unspecified cerebrovascular disease: Secondary | ICD-10-CM | POA: Diagnosis not present

## 2013-03-15 DIAGNOSIS — E7800 Pure hypercholesterolemia, unspecified: Secondary | ICD-10-CM | POA: Diagnosis not present

## 2013-03-15 DIAGNOSIS — I69993 Ataxia following unspecified cerebrovascular disease: Secondary | ICD-10-CM | POA: Diagnosis not present

## 2013-03-15 DIAGNOSIS — R4701 Aphasia: Secondary | ICD-10-CM | POA: Diagnosis not present

## 2013-03-15 DIAGNOSIS — I10 Essential (primary) hypertension: Secondary | ICD-10-CM | POA: Diagnosis not present

## 2013-03-15 DIAGNOSIS — I69939 Monoplegia of upper limb following unspecified cerebrovascular disease affecting unspecified side: Secondary | ICD-10-CM | POA: Diagnosis not present

## 2013-03-16 DIAGNOSIS — I69921 Dysphasia following unspecified cerebrovascular disease: Secondary | ICD-10-CM | POA: Diagnosis not present

## 2013-03-16 DIAGNOSIS — I69939 Monoplegia of upper limb following unspecified cerebrovascular disease affecting unspecified side: Secondary | ICD-10-CM | POA: Diagnosis not present

## 2013-03-16 DIAGNOSIS — I69993 Ataxia following unspecified cerebrovascular disease: Secondary | ICD-10-CM | POA: Diagnosis not present

## 2013-03-16 DIAGNOSIS — E7800 Pure hypercholesterolemia, unspecified: Secondary | ICD-10-CM | POA: Diagnosis not present

## 2013-03-16 DIAGNOSIS — I10 Essential (primary) hypertension: Secondary | ICD-10-CM | POA: Diagnosis not present

## 2013-03-16 DIAGNOSIS — R4701 Aphasia: Secondary | ICD-10-CM | POA: Diagnosis not present

## 2013-03-17 DIAGNOSIS — E7800 Pure hypercholesterolemia, unspecified: Secondary | ICD-10-CM | POA: Diagnosis not present

## 2013-03-17 DIAGNOSIS — R4701 Aphasia: Secondary | ICD-10-CM | POA: Diagnosis not present

## 2013-03-17 DIAGNOSIS — I69993 Ataxia following unspecified cerebrovascular disease: Secondary | ICD-10-CM | POA: Diagnosis not present

## 2013-03-17 DIAGNOSIS — I69939 Monoplegia of upper limb following unspecified cerebrovascular disease affecting unspecified side: Secondary | ICD-10-CM | POA: Diagnosis not present

## 2013-03-17 DIAGNOSIS — I10 Essential (primary) hypertension: Secondary | ICD-10-CM | POA: Diagnosis not present

## 2013-03-17 DIAGNOSIS — I69921 Dysphasia following unspecified cerebrovascular disease: Secondary | ICD-10-CM | POA: Diagnosis not present

## 2013-03-20 DIAGNOSIS — I69993 Ataxia following unspecified cerebrovascular disease: Secondary | ICD-10-CM | POA: Diagnosis not present

## 2013-03-20 DIAGNOSIS — E7800 Pure hypercholesterolemia, unspecified: Secondary | ICD-10-CM | POA: Diagnosis not present

## 2013-03-20 DIAGNOSIS — I69939 Monoplegia of upper limb following unspecified cerebrovascular disease affecting unspecified side: Secondary | ICD-10-CM | POA: Diagnosis not present

## 2013-03-20 DIAGNOSIS — I10 Essential (primary) hypertension: Secondary | ICD-10-CM | POA: Diagnosis not present

## 2013-03-20 DIAGNOSIS — R4701 Aphasia: Secondary | ICD-10-CM | POA: Diagnosis not present

## 2013-03-20 DIAGNOSIS — I69921 Dysphasia following unspecified cerebrovascular disease: Secondary | ICD-10-CM | POA: Diagnosis not present

## 2013-03-21 DIAGNOSIS — I10 Essential (primary) hypertension: Secondary | ICD-10-CM | POA: Diagnosis not present

## 2013-03-21 DIAGNOSIS — I69993 Ataxia following unspecified cerebrovascular disease: Secondary | ICD-10-CM | POA: Diagnosis not present

## 2013-03-21 DIAGNOSIS — R4701 Aphasia: Secondary | ICD-10-CM | POA: Diagnosis not present

## 2013-03-21 DIAGNOSIS — E7800 Pure hypercholesterolemia, unspecified: Secondary | ICD-10-CM | POA: Diagnosis not present

## 2013-03-21 DIAGNOSIS — I69921 Dysphasia following unspecified cerebrovascular disease: Secondary | ICD-10-CM | POA: Diagnosis not present

## 2013-03-21 DIAGNOSIS — I69939 Monoplegia of upper limb following unspecified cerebrovascular disease affecting unspecified side: Secondary | ICD-10-CM | POA: Diagnosis not present

## 2013-03-23 DIAGNOSIS — I10 Essential (primary) hypertension: Secondary | ICD-10-CM | POA: Diagnosis not present

## 2013-03-23 DIAGNOSIS — I69993 Ataxia following unspecified cerebrovascular disease: Secondary | ICD-10-CM | POA: Diagnosis not present

## 2013-03-23 DIAGNOSIS — E785 Hyperlipidemia, unspecified: Secondary | ICD-10-CM | POA: Diagnosis not present

## 2013-03-23 DIAGNOSIS — R4701 Aphasia: Secondary | ICD-10-CM | POA: Diagnosis not present

## 2013-03-23 DIAGNOSIS — I69939 Monoplegia of upper limb following unspecified cerebrovascular disease affecting unspecified side: Secondary | ICD-10-CM | POA: Diagnosis not present

## 2013-03-23 DIAGNOSIS — I69921 Dysphasia following unspecified cerebrovascular disease: Secondary | ICD-10-CM | POA: Diagnosis not present

## 2013-03-23 DIAGNOSIS — IMO0002 Reserved for concepts with insufficient information to code with codable children: Secondary | ICD-10-CM | POA: Diagnosis not present

## 2013-03-23 DIAGNOSIS — I6350 Cerebral infarction due to unspecified occlusion or stenosis of unspecified cerebral artery: Secondary | ICD-10-CM | POA: Diagnosis not present

## 2013-03-23 DIAGNOSIS — R413 Other amnesia: Secondary | ICD-10-CM | POA: Diagnosis not present

## 2013-03-23 DIAGNOSIS — E7800 Pure hypercholesterolemia, unspecified: Secondary | ICD-10-CM | POA: Diagnosis not present

## 2013-03-24 DIAGNOSIS — I69939 Monoplegia of upper limb following unspecified cerebrovascular disease affecting unspecified side: Secondary | ICD-10-CM | POA: Diagnosis not present

## 2013-03-24 DIAGNOSIS — I69921 Dysphasia following unspecified cerebrovascular disease: Secondary | ICD-10-CM | POA: Diagnosis not present

## 2013-03-24 DIAGNOSIS — I10 Essential (primary) hypertension: Secondary | ICD-10-CM | POA: Diagnosis not present

## 2013-03-24 DIAGNOSIS — I69993 Ataxia following unspecified cerebrovascular disease: Secondary | ICD-10-CM | POA: Diagnosis not present

## 2013-03-24 DIAGNOSIS — R4701 Aphasia: Secondary | ICD-10-CM | POA: Diagnosis not present

## 2013-03-24 DIAGNOSIS — E7800 Pure hypercholesterolemia, unspecified: Secondary | ICD-10-CM | POA: Diagnosis not present

## 2013-03-25 DIAGNOSIS — R4701 Aphasia: Secondary | ICD-10-CM | POA: Diagnosis not present

## 2013-03-25 DIAGNOSIS — I69939 Monoplegia of upper limb following unspecified cerebrovascular disease affecting unspecified side: Secondary | ICD-10-CM | POA: Diagnosis not present

## 2013-03-25 DIAGNOSIS — E7800 Pure hypercholesterolemia, unspecified: Secondary | ICD-10-CM | POA: Diagnosis not present

## 2013-03-25 DIAGNOSIS — I10 Essential (primary) hypertension: Secondary | ICD-10-CM | POA: Diagnosis not present

## 2013-03-25 DIAGNOSIS — I69993 Ataxia following unspecified cerebrovascular disease: Secondary | ICD-10-CM | POA: Diagnosis not present

## 2013-03-25 DIAGNOSIS — I69921 Dysphasia following unspecified cerebrovascular disease: Secondary | ICD-10-CM | POA: Diagnosis not present

## 2013-03-27 DIAGNOSIS — I69921 Dysphasia following unspecified cerebrovascular disease: Secondary | ICD-10-CM | POA: Diagnosis not present

## 2013-03-27 DIAGNOSIS — E7800 Pure hypercholesterolemia, unspecified: Secondary | ICD-10-CM | POA: Diagnosis not present

## 2013-03-27 DIAGNOSIS — I69993 Ataxia following unspecified cerebrovascular disease: Secondary | ICD-10-CM | POA: Diagnosis not present

## 2013-03-27 DIAGNOSIS — I10 Essential (primary) hypertension: Secondary | ICD-10-CM | POA: Diagnosis not present

## 2013-03-27 DIAGNOSIS — R4701 Aphasia: Secondary | ICD-10-CM | POA: Diagnosis not present

## 2013-03-27 DIAGNOSIS — I69939 Monoplegia of upper limb following unspecified cerebrovascular disease affecting unspecified side: Secondary | ICD-10-CM | POA: Diagnosis not present

## 2013-03-28 DIAGNOSIS — I10 Essential (primary) hypertension: Secondary | ICD-10-CM | POA: Diagnosis not present

## 2013-03-28 DIAGNOSIS — I69921 Dysphasia following unspecified cerebrovascular disease: Secondary | ICD-10-CM | POA: Diagnosis not present

## 2013-03-28 DIAGNOSIS — R4701 Aphasia: Secondary | ICD-10-CM | POA: Diagnosis not present

## 2013-03-28 DIAGNOSIS — I69939 Monoplegia of upper limb following unspecified cerebrovascular disease affecting unspecified side: Secondary | ICD-10-CM | POA: Diagnosis not present

## 2013-03-28 DIAGNOSIS — I69993 Ataxia following unspecified cerebrovascular disease: Secondary | ICD-10-CM | POA: Diagnosis not present

## 2013-03-28 DIAGNOSIS — E7800 Pure hypercholesterolemia, unspecified: Secondary | ICD-10-CM | POA: Diagnosis not present

## 2013-03-29 DIAGNOSIS — R4701 Aphasia: Secondary | ICD-10-CM | POA: Diagnosis not present

## 2013-03-29 DIAGNOSIS — I69993 Ataxia following unspecified cerebrovascular disease: Secondary | ICD-10-CM | POA: Diagnosis not present

## 2013-03-29 DIAGNOSIS — E7800 Pure hypercholesterolemia, unspecified: Secondary | ICD-10-CM | POA: Diagnosis not present

## 2013-03-29 DIAGNOSIS — I69921 Dysphasia following unspecified cerebrovascular disease: Secondary | ICD-10-CM | POA: Diagnosis not present

## 2013-03-29 DIAGNOSIS — I10 Essential (primary) hypertension: Secondary | ICD-10-CM | POA: Diagnosis not present

## 2013-03-29 DIAGNOSIS — I69939 Monoplegia of upper limb following unspecified cerebrovascular disease affecting unspecified side: Secondary | ICD-10-CM | POA: Diagnosis not present

## 2013-03-30 DIAGNOSIS — I69993 Ataxia following unspecified cerebrovascular disease: Secondary | ICD-10-CM | POA: Diagnosis not present

## 2013-03-30 DIAGNOSIS — I69921 Dysphasia following unspecified cerebrovascular disease: Secondary | ICD-10-CM | POA: Diagnosis not present

## 2013-03-30 DIAGNOSIS — R4701 Aphasia: Secondary | ICD-10-CM | POA: Diagnosis not present

## 2013-03-30 DIAGNOSIS — I69939 Monoplegia of upper limb following unspecified cerebrovascular disease affecting unspecified side: Secondary | ICD-10-CM | POA: Diagnosis not present

## 2013-03-30 DIAGNOSIS — I10 Essential (primary) hypertension: Secondary | ICD-10-CM | POA: Diagnosis not present

## 2013-03-30 DIAGNOSIS — E7800 Pure hypercholesterolemia, unspecified: Secondary | ICD-10-CM | POA: Diagnosis not present

## 2013-03-31 DIAGNOSIS — R4701 Aphasia: Secondary | ICD-10-CM | POA: Diagnosis not present

## 2013-03-31 DIAGNOSIS — I69993 Ataxia following unspecified cerebrovascular disease: Secondary | ICD-10-CM | POA: Diagnosis not present

## 2013-03-31 DIAGNOSIS — E7800 Pure hypercholesterolemia, unspecified: Secondary | ICD-10-CM | POA: Diagnosis not present

## 2013-03-31 DIAGNOSIS — I69939 Monoplegia of upper limb following unspecified cerebrovascular disease affecting unspecified side: Secondary | ICD-10-CM | POA: Diagnosis not present

## 2013-03-31 DIAGNOSIS — I69921 Dysphasia following unspecified cerebrovascular disease: Secondary | ICD-10-CM | POA: Diagnosis not present

## 2013-03-31 DIAGNOSIS — I10 Essential (primary) hypertension: Secondary | ICD-10-CM | POA: Diagnosis not present

## 2013-04-03 DIAGNOSIS — I69939 Monoplegia of upper limb following unspecified cerebrovascular disease affecting unspecified side: Secondary | ICD-10-CM | POA: Diagnosis not present

## 2013-04-03 DIAGNOSIS — R4701 Aphasia: Secondary | ICD-10-CM | POA: Diagnosis not present

## 2013-04-03 DIAGNOSIS — I69921 Dysphasia following unspecified cerebrovascular disease: Secondary | ICD-10-CM | POA: Diagnosis not present

## 2013-04-03 DIAGNOSIS — I10 Essential (primary) hypertension: Secondary | ICD-10-CM | POA: Diagnosis not present

## 2013-04-03 DIAGNOSIS — I69993 Ataxia following unspecified cerebrovascular disease: Secondary | ICD-10-CM | POA: Diagnosis not present

## 2013-04-03 DIAGNOSIS — E7800 Pure hypercholesterolemia, unspecified: Secondary | ICD-10-CM | POA: Diagnosis not present

## 2013-04-04 DIAGNOSIS — R4701 Aphasia: Secondary | ICD-10-CM | POA: Diagnosis not present

## 2013-04-04 DIAGNOSIS — I69939 Monoplegia of upper limb following unspecified cerebrovascular disease affecting unspecified side: Secondary | ICD-10-CM | POA: Diagnosis not present

## 2013-04-04 DIAGNOSIS — I69921 Dysphasia following unspecified cerebrovascular disease: Secondary | ICD-10-CM | POA: Diagnosis not present

## 2013-04-04 DIAGNOSIS — I10 Essential (primary) hypertension: Secondary | ICD-10-CM | POA: Diagnosis not present

## 2013-04-04 DIAGNOSIS — I69993 Ataxia following unspecified cerebrovascular disease: Secondary | ICD-10-CM | POA: Diagnosis not present

## 2013-04-04 DIAGNOSIS — E7800 Pure hypercholesterolemia, unspecified: Secondary | ICD-10-CM | POA: Diagnosis not present

## 2013-04-05 DIAGNOSIS — I69993 Ataxia following unspecified cerebrovascular disease: Secondary | ICD-10-CM | POA: Diagnosis not present

## 2013-04-05 DIAGNOSIS — I69939 Monoplegia of upper limb following unspecified cerebrovascular disease affecting unspecified side: Secondary | ICD-10-CM | POA: Diagnosis not present

## 2013-04-05 DIAGNOSIS — I10 Essential (primary) hypertension: Secondary | ICD-10-CM | POA: Diagnosis not present

## 2013-04-05 DIAGNOSIS — E7800 Pure hypercholesterolemia, unspecified: Secondary | ICD-10-CM | POA: Diagnosis not present

## 2013-04-05 DIAGNOSIS — I69921 Dysphasia following unspecified cerebrovascular disease: Secondary | ICD-10-CM | POA: Diagnosis not present

## 2013-04-05 DIAGNOSIS — R4701 Aphasia: Secondary | ICD-10-CM | POA: Diagnosis not present

## 2013-04-06 DIAGNOSIS — E7800 Pure hypercholesterolemia, unspecified: Secondary | ICD-10-CM | POA: Diagnosis not present

## 2013-04-06 DIAGNOSIS — I69921 Dysphasia following unspecified cerebrovascular disease: Secondary | ICD-10-CM | POA: Diagnosis not present

## 2013-04-06 DIAGNOSIS — R4701 Aphasia: Secondary | ICD-10-CM | POA: Diagnosis not present

## 2013-04-06 DIAGNOSIS — I69993 Ataxia following unspecified cerebrovascular disease: Secondary | ICD-10-CM | POA: Diagnosis not present

## 2013-04-06 DIAGNOSIS — I69939 Monoplegia of upper limb following unspecified cerebrovascular disease affecting unspecified side: Secondary | ICD-10-CM | POA: Diagnosis not present

## 2013-04-06 DIAGNOSIS — I10 Essential (primary) hypertension: Secondary | ICD-10-CM | POA: Diagnosis not present

## 2013-04-07 DIAGNOSIS — R4701 Aphasia: Secondary | ICD-10-CM | POA: Diagnosis not present

## 2013-04-07 DIAGNOSIS — I10 Essential (primary) hypertension: Secondary | ICD-10-CM | POA: Diagnosis not present

## 2013-04-07 DIAGNOSIS — E7800 Pure hypercholesterolemia, unspecified: Secondary | ICD-10-CM | POA: Diagnosis not present

## 2013-04-07 DIAGNOSIS — I69921 Dysphasia following unspecified cerebrovascular disease: Secondary | ICD-10-CM | POA: Diagnosis not present

## 2013-04-07 DIAGNOSIS — I69993 Ataxia following unspecified cerebrovascular disease: Secondary | ICD-10-CM | POA: Diagnosis not present

## 2013-04-07 DIAGNOSIS — I69939 Monoplegia of upper limb following unspecified cerebrovascular disease affecting unspecified side: Secondary | ICD-10-CM | POA: Diagnosis not present

## 2013-04-10 DIAGNOSIS — I69939 Monoplegia of upper limb following unspecified cerebrovascular disease affecting unspecified side: Secondary | ICD-10-CM | POA: Diagnosis not present

## 2013-04-10 DIAGNOSIS — I69993 Ataxia following unspecified cerebrovascular disease: Secondary | ICD-10-CM | POA: Diagnosis not present

## 2013-04-10 DIAGNOSIS — I10 Essential (primary) hypertension: Secondary | ICD-10-CM | POA: Diagnosis not present

## 2013-04-10 DIAGNOSIS — E7800 Pure hypercholesterolemia, unspecified: Secondary | ICD-10-CM | POA: Diagnosis not present

## 2013-04-10 DIAGNOSIS — I69921 Dysphasia following unspecified cerebrovascular disease: Secondary | ICD-10-CM | POA: Diagnosis not present

## 2013-04-10 DIAGNOSIS — R4701 Aphasia: Secondary | ICD-10-CM | POA: Diagnosis not present

## 2013-04-12 DIAGNOSIS — I69939 Monoplegia of upper limb following unspecified cerebrovascular disease affecting unspecified side: Secondary | ICD-10-CM | POA: Diagnosis not present

## 2013-04-12 DIAGNOSIS — I69993 Ataxia following unspecified cerebrovascular disease: Secondary | ICD-10-CM | POA: Diagnosis not present

## 2013-04-12 DIAGNOSIS — R4701 Aphasia: Secondary | ICD-10-CM | POA: Diagnosis not present

## 2013-04-12 DIAGNOSIS — I10 Essential (primary) hypertension: Secondary | ICD-10-CM | POA: Diagnosis not present

## 2013-04-12 DIAGNOSIS — I69921 Dysphasia following unspecified cerebrovascular disease: Secondary | ICD-10-CM | POA: Diagnosis not present

## 2013-04-12 DIAGNOSIS — E7800 Pure hypercholesterolemia, unspecified: Secondary | ICD-10-CM | POA: Diagnosis not present

## 2013-04-13 DIAGNOSIS — I10 Essential (primary) hypertension: Secondary | ICD-10-CM | POA: Diagnosis not present

## 2013-04-13 DIAGNOSIS — R4701 Aphasia: Secondary | ICD-10-CM | POA: Diagnosis not present

## 2013-04-13 DIAGNOSIS — I69993 Ataxia following unspecified cerebrovascular disease: Secondary | ICD-10-CM | POA: Diagnosis not present

## 2013-04-13 DIAGNOSIS — I69939 Monoplegia of upper limb following unspecified cerebrovascular disease affecting unspecified side: Secondary | ICD-10-CM | POA: Diagnosis not present

## 2013-04-13 DIAGNOSIS — I69921 Dysphasia following unspecified cerebrovascular disease: Secondary | ICD-10-CM | POA: Diagnosis not present

## 2013-04-13 DIAGNOSIS — E7800 Pure hypercholesterolemia, unspecified: Secondary | ICD-10-CM | POA: Diagnosis not present

## 2013-04-14 DIAGNOSIS — E7800 Pure hypercholesterolemia, unspecified: Secondary | ICD-10-CM | POA: Diagnosis not present

## 2013-04-14 DIAGNOSIS — I69921 Dysphasia following unspecified cerebrovascular disease: Secondary | ICD-10-CM | POA: Diagnosis not present

## 2013-04-14 DIAGNOSIS — I69939 Monoplegia of upper limb following unspecified cerebrovascular disease affecting unspecified side: Secondary | ICD-10-CM | POA: Diagnosis not present

## 2013-04-14 DIAGNOSIS — I10 Essential (primary) hypertension: Secondary | ICD-10-CM | POA: Diagnosis not present

## 2013-04-14 DIAGNOSIS — R4701 Aphasia: Secondary | ICD-10-CM | POA: Diagnosis not present

## 2013-04-14 DIAGNOSIS — I69993 Ataxia following unspecified cerebrovascular disease: Secondary | ICD-10-CM | POA: Diagnosis not present

## 2013-04-17 DIAGNOSIS — I69921 Dysphasia following unspecified cerebrovascular disease: Secondary | ICD-10-CM | POA: Diagnosis not present

## 2013-04-17 DIAGNOSIS — R4701 Aphasia: Secondary | ICD-10-CM | POA: Diagnosis not present

## 2013-04-17 DIAGNOSIS — I69993 Ataxia following unspecified cerebrovascular disease: Secondary | ICD-10-CM | POA: Diagnosis not present

## 2013-04-17 DIAGNOSIS — E7800 Pure hypercholesterolemia, unspecified: Secondary | ICD-10-CM | POA: Diagnosis not present

## 2013-04-17 DIAGNOSIS — I69939 Monoplegia of upper limb following unspecified cerebrovascular disease affecting unspecified side: Secondary | ICD-10-CM | POA: Diagnosis not present

## 2013-04-17 DIAGNOSIS — I10 Essential (primary) hypertension: Secondary | ICD-10-CM | POA: Diagnosis not present

## 2013-04-18 DIAGNOSIS — I69993 Ataxia following unspecified cerebrovascular disease: Secondary | ICD-10-CM | POA: Diagnosis not present

## 2013-04-18 DIAGNOSIS — I69921 Dysphasia following unspecified cerebrovascular disease: Secondary | ICD-10-CM | POA: Diagnosis not present

## 2013-04-18 DIAGNOSIS — R4701 Aphasia: Secondary | ICD-10-CM | POA: Diagnosis not present

## 2013-04-18 DIAGNOSIS — I10 Essential (primary) hypertension: Secondary | ICD-10-CM | POA: Diagnosis not present

## 2013-04-18 DIAGNOSIS — E7800 Pure hypercholesterolemia, unspecified: Secondary | ICD-10-CM | POA: Diagnosis not present

## 2013-04-18 DIAGNOSIS — I69939 Monoplegia of upper limb following unspecified cerebrovascular disease affecting unspecified side: Secondary | ICD-10-CM | POA: Diagnosis not present

## 2013-04-19 DIAGNOSIS — R4701 Aphasia: Secondary | ICD-10-CM | POA: Diagnosis not present

## 2013-04-19 DIAGNOSIS — I69939 Monoplegia of upper limb following unspecified cerebrovascular disease affecting unspecified side: Secondary | ICD-10-CM | POA: Diagnosis not present

## 2013-04-19 DIAGNOSIS — E7800 Pure hypercholesterolemia, unspecified: Secondary | ICD-10-CM | POA: Diagnosis not present

## 2013-04-19 DIAGNOSIS — I69993 Ataxia following unspecified cerebrovascular disease: Secondary | ICD-10-CM | POA: Diagnosis not present

## 2013-04-19 DIAGNOSIS — I69921 Dysphasia following unspecified cerebrovascular disease: Secondary | ICD-10-CM | POA: Diagnosis not present

## 2013-04-19 DIAGNOSIS — I10 Essential (primary) hypertension: Secondary | ICD-10-CM | POA: Diagnosis not present

## 2013-04-20 DIAGNOSIS — I69993 Ataxia following unspecified cerebrovascular disease: Secondary | ICD-10-CM | POA: Diagnosis not present

## 2013-04-20 DIAGNOSIS — R4701 Aphasia: Secondary | ICD-10-CM | POA: Diagnosis not present

## 2013-04-20 DIAGNOSIS — I69939 Monoplegia of upper limb following unspecified cerebrovascular disease affecting unspecified side: Secondary | ICD-10-CM | POA: Diagnosis not present

## 2013-04-20 DIAGNOSIS — I10 Essential (primary) hypertension: Secondary | ICD-10-CM | POA: Diagnosis not present

## 2013-04-20 DIAGNOSIS — E7800 Pure hypercholesterolemia, unspecified: Secondary | ICD-10-CM | POA: Diagnosis not present

## 2013-04-20 DIAGNOSIS — I69921 Dysphasia following unspecified cerebrovascular disease: Secondary | ICD-10-CM | POA: Diagnosis not present

## 2013-04-22 DIAGNOSIS — I69993 Ataxia following unspecified cerebrovascular disease: Secondary | ICD-10-CM | POA: Diagnosis not present

## 2013-04-22 DIAGNOSIS — I10 Essential (primary) hypertension: Secondary | ICD-10-CM | POA: Diagnosis not present

## 2013-04-22 DIAGNOSIS — R4701 Aphasia: Secondary | ICD-10-CM | POA: Diagnosis not present

## 2013-04-22 DIAGNOSIS — I69921 Dysphasia following unspecified cerebrovascular disease: Secondary | ICD-10-CM | POA: Diagnosis not present

## 2013-04-22 DIAGNOSIS — I69939 Monoplegia of upper limb following unspecified cerebrovascular disease affecting unspecified side: Secondary | ICD-10-CM | POA: Diagnosis not present

## 2013-04-22 DIAGNOSIS — E7800 Pure hypercholesterolemia, unspecified: Secondary | ICD-10-CM | POA: Diagnosis not present

## 2013-04-24 DIAGNOSIS — I69939 Monoplegia of upper limb following unspecified cerebrovascular disease affecting unspecified side: Secondary | ICD-10-CM | POA: Diagnosis not present

## 2013-04-24 DIAGNOSIS — I10 Essential (primary) hypertension: Secondary | ICD-10-CM | POA: Diagnosis not present

## 2013-04-24 DIAGNOSIS — I69993 Ataxia following unspecified cerebrovascular disease: Secondary | ICD-10-CM | POA: Diagnosis not present

## 2013-04-24 DIAGNOSIS — I69921 Dysphasia following unspecified cerebrovascular disease: Secondary | ICD-10-CM | POA: Diagnosis not present

## 2013-04-24 DIAGNOSIS — R4701 Aphasia: Secondary | ICD-10-CM | POA: Diagnosis not present

## 2013-04-24 DIAGNOSIS — E7800 Pure hypercholesterolemia, unspecified: Secondary | ICD-10-CM | POA: Diagnosis not present

## 2013-04-25 DIAGNOSIS — I69921 Dysphasia following unspecified cerebrovascular disease: Secondary | ICD-10-CM | POA: Diagnosis not present

## 2013-04-25 DIAGNOSIS — I10 Essential (primary) hypertension: Secondary | ICD-10-CM | POA: Diagnosis not present

## 2013-04-25 DIAGNOSIS — I69993 Ataxia following unspecified cerebrovascular disease: Secondary | ICD-10-CM | POA: Diagnosis not present

## 2013-04-25 DIAGNOSIS — R4701 Aphasia: Secondary | ICD-10-CM | POA: Diagnosis not present

## 2013-04-25 DIAGNOSIS — E7800 Pure hypercholesterolemia, unspecified: Secondary | ICD-10-CM | POA: Diagnosis not present

## 2013-04-25 DIAGNOSIS — I69939 Monoplegia of upper limb following unspecified cerebrovascular disease affecting unspecified side: Secondary | ICD-10-CM | POA: Diagnosis not present

## 2013-04-26 DIAGNOSIS — I69993 Ataxia following unspecified cerebrovascular disease: Secondary | ICD-10-CM | POA: Diagnosis not present

## 2013-04-26 DIAGNOSIS — I10 Essential (primary) hypertension: Secondary | ICD-10-CM | POA: Diagnosis not present

## 2013-04-26 DIAGNOSIS — E7800 Pure hypercholesterolemia, unspecified: Secondary | ICD-10-CM | POA: Diagnosis not present

## 2013-04-26 DIAGNOSIS — I69921 Dysphasia following unspecified cerebrovascular disease: Secondary | ICD-10-CM | POA: Diagnosis not present

## 2013-04-26 DIAGNOSIS — R4701 Aphasia: Secondary | ICD-10-CM | POA: Diagnosis not present

## 2013-04-26 DIAGNOSIS — I69939 Monoplegia of upper limb following unspecified cerebrovascular disease affecting unspecified side: Secondary | ICD-10-CM | POA: Diagnosis not present

## 2013-04-27 ENCOUNTER — Ambulatory Visit: Payer: BC Managed Care – PPO | Admitting: Neurology

## 2013-04-27 DIAGNOSIS — R4701 Aphasia: Secondary | ICD-10-CM | POA: Diagnosis not present

## 2013-04-27 DIAGNOSIS — I10 Essential (primary) hypertension: Secondary | ICD-10-CM | POA: Diagnosis not present

## 2013-04-27 DIAGNOSIS — I69939 Monoplegia of upper limb following unspecified cerebrovascular disease affecting unspecified side: Secondary | ICD-10-CM | POA: Diagnosis not present

## 2013-04-27 DIAGNOSIS — E7800 Pure hypercholesterolemia, unspecified: Secondary | ICD-10-CM | POA: Diagnosis not present

## 2013-04-27 DIAGNOSIS — I69921 Dysphasia following unspecified cerebrovascular disease: Secondary | ICD-10-CM | POA: Diagnosis not present

## 2013-04-27 DIAGNOSIS — I69993 Ataxia following unspecified cerebrovascular disease: Secondary | ICD-10-CM | POA: Diagnosis not present

## 2013-05-01 DIAGNOSIS — R4701 Aphasia: Secondary | ICD-10-CM | POA: Diagnosis not present

## 2013-05-01 DIAGNOSIS — I69921 Dysphasia following unspecified cerebrovascular disease: Secondary | ICD-10-CM | POA: Diagnosis not present

## 2013-05-01 DIAGNOSIS — I69993 Ataxia following unspecified cerebrovascular disease: Secondary | ICD-10-CM | POA: Diagnosis not present

## 2013-05-01 DIAGNOSIS — E7800 Pure hypercholesterolemia, unspecified: Secondary | ICD-10-CM | POA: Diagnosis not present

## 2013-05-01 DIAGNOSIS — I69939 Monoplegia of upper limb following unspecified cerebrovascular disease affecting unspecified side: Secondary | ICD-10-CM | POA: Diagnosis not present

## 2013-05-01 DIAGNOSIS — I10 Essential (primary) hypertension: Secondary | ICD-10-CM | POA: Diagnosis not present

## 2013-05-02 DIAGNOSIS — I69939 Monoplegia of upper limb following unspecified cerebrovascular disease affecting unspecified side: Secondary | ICD-10-CM | POA: Diagnosis not present

## 2013-05-02 DIAGNOSIS — I69921 Dysphasia following unspecified cerebrovascular disease: Secondary | ICD-10-CM | POA: Diagnosis not present

## 2013-05-02 DIAGNOSIS — I69993 Ataxia following unspecified cerebrovascular disease: Secondary | ICD-10-CM | POA: Diagnosis not present

## 2013-05-02 DIAGNOSIS — R4701 Aphasia: Secondary | ICD-10-CM | POA: Diagnosis not present

## 2013-05-02 DIAGNOSIS — I10 Essential (primary) hypertension: Secondary | ICD-10-CM | POA: Diagnosis not present

## 2013-05-02 DIAGNOSIS — E7800 Pure hypercholesterolemia, unspecified: Secondary | ICD-10-CM | POA: Diagnosis not present

## 2013-05-03 DIAGNOSIS — E7800 Pure hypercholesterolemia, unspecified: Secondary | ICD-10-CM | POA: Diagnosis not present

## 2013-05-03 DIAGNOSIS — I69993 Ataxia following unspecified cerebrovascular disease: Secondary | ICD-10-CM | POA: Diagnosis not present

## 2013-05-03 DIAGNOSIS — I69939 Monoplegia of upper limb following unspecified cerebrovascular disease affecting unspecified side: Secondary | ICD-10-CM | POA: Diagnosis not present

## 2013-05-03 DIAGNOSIS — I10 Essential (primary) hypertension: Secondary | ICD-10-CM | POA: Diagnosis not present

## 2013-05-03 DIAGNOSIS — I69921 Dysphasia following unspecified cerebrovascular disease: Secondary | ICD-10-CM | POA: Diagnosis not present

## 2013-05-03 DIAGNOSIS — R4701 Aphasia: Secondary | ICD-10-CM | POA: Diagnosis not present

## 2013-06-28 DIAGNOSIS — I10 Essential (primary) hypertension: Secondary | ICD-10-CM | POA: Diagnosis not present

## 2013-06-28 DIAGNOSIS — M810 Age-related osteoporosis without current pathological fracture: Secondary | ICD-10-CM | POA: Diagnosis not present

## 2013-06-28 DIAGNOSIS — IMO0002 Reserved for concepts with insufficient information to code with codable children: Secondary | ICD-10-CM | POA: Diagnosis not present

## 2013-06-28 DIAGNOSIS — E785 Hyperlipidemia, unspecified: Secondary | ICD-10-CM | POA: Diagnosis not present

## 2013-06-28 DIAGNOSIS — Z1331 Encounter for screening for depression: Secondary | ICD-10-CM | POA: Diagnosis not present

## 2013-06-28 DIAGNOSIS — I6350 Cerebral infarction due to unspecified occlusion or stenosis of unspecified cerebral artery: Secondary | ICD-10-CM | POA: Diagnosis not present

## 2013-07-13 DIAGNOSIS — I10 Essential (primary) hypertension: Secondary | ICD-10-CM | POA: Diagnosis not present

## 2013-07-13 DIAGNOSIS — R5381 Other malaise: Secondary | ICD-10-CM | POA: Diagnosis not present

## 2013-07-13 DIAGNOSIS — R5383 Other fatigue: Secondary | ICD-10-CM | POA: Diagnosis not present

## 2013-07-13 DIAGNOSIS — IMO0002 Reserved for concepts with insufficient information to code with codable children: Secondary | ICD-10-CM | POA: Diagnosis not present

## 2013-07-25 ENCOUNTER — Ambulatory Visit (HOSPITAL_COMMUNITY): Admission: RE | Admit: 2013-07-25 | Payer: Medicare Other | Source: Ambulatory Visit

## 2013-07-25 ENCOUNTER — Other Ambulatory Visit (HOSPITAL_COMMUNITY): Payer: Self-pay | Admitting: Internal Medicine

## 2013-07-27 DIAGNOSIS — I10 Essential (primary) hypertension: Secondary | ICD-10-CM | POA: Diagnosis not present

## 2013-07-27 DIAGNOSIS — R413 Other amnesia: Secondary | ICD-10-CM | POA: Diagnosis not present

## 2013-07-27 DIAGNOSIS — IMO0002 Reserved for concepts with insufficient information to code with codable children: Secondary | ICD-10-CM | POA: Diagnosis not present

## 2013-07-27 DIAGNOSIS — I6350 Cerebral infarction due to unspecified occlusion or stenosis of unspecified cerebral artery: Secondary | ICD-10-CM | POA: Diagnosis not present

## 2013-07-27 DIAGNOSIS — M810 Age-related osteoporosis without current pathological fracture: Secondary | ICD-10-CM | POA: Diagnosis not present

## 2013-08-04 ENCOUNTER — Encounter: Payer: Self-pay | Admitting: Cardiology

## 2013-08-16 ENCOUNTER — Ambulatory Visit (HOSPITAL_COMMUNITY)
Admission: RE | Admit: 2013-08-16 | Discharge: 2013-08-16 | Disposition: A | Discharge status: Home / Self Care | Payer: Medicare Other | Source: Ambulatory Visit | Attending: Internal Medicine | Admitting: Internal Medicine

## 2013-08-16 ENCOUNTER — Encounter (HOSPITAL_COMMUNITY): Payer: Self-pay

## 2013-08-16 DIAGNOSIS — M810 Age-related osteoporosis without current pathological fracture: Secondary | ICD-10-CM | POA: Diagnosis not present

## 2013-08-16 MED ORDER — SODIUM CHLORIDE 0.9 % IV SOLN
Freq: Once | INTRAVENOUS | Status: AC
  Administered 2013-08-16: 13:00:00 via INTRAVENOUS

## 2013-08-16 MED ORDER — ZOLEDRONIC ACID 5 MG/100ML IV SOLN
5.0000 mg | Freq: Once | INTRAVENOUS | Status: AC
  Administered 2013-08-16: 5 mg via INTRAVENOUS
  Filled 2013-08-16: qty 100

## 2013-08-16 NOTE — Discharge Instructions (Signed)

## 2013-09-19 ENCOUNTER — Encounter: Payer: Self-pay | Admitting: *Deleted

## 2013-09-27 ENCOUNTER — Ambulatory Visit (INDEPENDENT_AMBULATORY_CARE_PROVIDER_SITE_OTHER): Payer: Medicare Other | Admitting: *Deleted

## 2013-09-27 DIAGNOSIS — I6350 Cerebral infarction due to unspecified occlusion or stenosis of unspecified cerebral artery: Secondary | ICD-10-CM

## 2013-09-27 DIAGNOSIS — I639 Cerebral infarction, unspecified: Secondary | ICD-10-CM

## 2013-09-28 LAB — MDC_IDC_ENUM_SESS_TYPE_INCLINIC
Date Time Interrogation Session: 20150805193148
MDC IDC SET ZONE DETECTION INTERVAL: 390 ms
Zone Setting Detection Interval: 2000 ms
Zone Setting Detection Interval: 3000 ms

## 2013-09-28 NOTE — Progress Notes (Signed)
Loop check in clinic. Wound appears well healed. Pt with 1 tachy episode x 5 sec at 194bpm---SVT; 0 brady episodes;  0 asystole; 15 AF episodes (<0.1%)---max dur. 6 hours 20 mins (10-21), Max median V 150bpm---?PAF at times---hx of CVA, 80 y.o. F/M. Plan to F/U via [facility] and with [facility] in 03-2014.

## 2013-09-29 ENCOUNTER — Telehealth: Payer: Self-pay | Admitting: *Deleted

## 2013-09-29 NOTE — Telephone Encounter (Signed)
Informed patient that [facility] would like for her to come into the ofc to discuss AF. Patient voiced understanding. Will defer to MT for scheduling.

## 2013-10-09 ENCOUNTER — Ambulatory Visit (INDEPENDENT_AMBULATORY_CARE_PROVIDER_SITE_OTHER): Payer: Medicare Other | Admitting: Internal Medicine

## 2013-10-09 ENCOUNTER — Encounter: Payer: Self-pay | Admitting: Internal Medicine

## 2013-10-09 VITALS — BP 130/68 | HR 79 | Ht 62.0 in | Wt 126.0 lb

## 2013-10-09 DIAGNOSIS — I6350 Cerebral infarction due to unspecified occlusion or stenosis of unspecified cerebral artery: Secondary | ICD-10-CM

## 2013-10-09 DIAGNOSIS — I639 Cerebral infarction, unspecified: Secondary | ICD-10-CM

## 2013-10-09 DIAGNOSIS — I4891 Unspecified atrial fibrillation: Secondary | ICD-10-CM | POA: Diagnosis not present

## 2013-10-09 LAB — MDC_IDC_ENUM_SESS_TYPE_INCLINIC
Date Time Interrogation Session: 20150817112251
MDC IDC SET ZONE DETECTION INTERVAL: 3000 ms
Zone Setting Detection Interval: 2000 ms
Zone Setting Detection Interval: 390 ms

## 2013-10-09 LAB — CBC
HCT: 35.2 % — ABNORMAL LOW (ref 36.0–46.0)
Hemoglobin: 12.7 g/dL (ref 12.0–15.0)
MCHC: 25.7 g/dL — AB (ref 30.0–36.0)
MCV: 93.1 fl (ref 78.0–100.0)
PLATELETS: 262 10*3/uL (ref 150.0–400.0)
RBC: 3.78 Mil/uL — ABNORMAL LOW (ref 3.87–5.11)
RDW: 13.5 % (ref 11.5–15.5)
WBC: 10.4 10*3/uL (ref 4.0–10.5)

## 2013-10-09 LAB — BASIC METABOLIC PANEL
BUN: 20 mg/dL (ref 6–23)
CALCIUM: 9.9 mg/dL (ref 8.4–10.5)
CO2: 25 mEq/L (ref 19–32)
Chloride: 105 mEq/L (ref 96–112)
Creatinine, Ser: 0.9 mg/dL (ref 0.4–1.2)
GFR: 68.62 mL/min (ref >60.00)
Glucose, Bld: 93 mg/dL (ref 70–99)
Potassium: 4.4 mEq/L (ref 3.5–5.1)
Sodium: 142 mEq/L (ref 135–145)

## 2013-10-09 MED ORDER — APIXABAN 5 MG PO TABS: 5.0000 mg | ORAL_TABLET | Freq: Two times a day (BID) | ORAL | Status: DC

## 2013-10-09 NOTE — Progress Notes (Signed)
Primary Care Physician: Haywood Pao, MD Referring Physician: Dr. Crissie Sickles   Jane Farley is a 80 y.o. female with a  previous CVA in August and October of 2014,with some residual dysphasia, hyperlipidemia, HTN, PFO,  Here today for  PAF being revealed on LINQ which was implanted at time of second stroke in October, by Dr Lovena Le.  Patient was lost to follow up and just returned for interrogation August 6 th which showed PAF which is brief  and without significant high v. rates from which patient is asymptomatic.Due to previous strokes and chadsvasc score of at least 6, discussion was had with the patient and her son to stop asa and plavix and start Eliquis  for further stroke prevention.  Marland Kitchen Her weight is less than 60 kg( at 57 kg) but last creatinine in October 2014 was normal and less than 1.5 and her age is less than 80, indicating 5 mg bid would the correct dose. Met B and Cbc will be rechecked today. The patient did spend 3 months in SNF, but is now back home living independently. She does walk with a walker but no falls. Her son is nearby and assists as needed. She denies a bleeding history but does bruise easily with her current antiplatelets.  Today, she denies symptoms of palpitations, chest pain, shortness of breath, orthopnea, PND, lower extremity edema, dizziness, presyncope, syncope, . sequela. The patient is tolerating medications with easy brusing and is otherwise without complaint today.   Past Medical History  Diagnosis Date  . Hemorrhoids   . Nephrolithiasis   . Hypercholesterolemia   . Hypertension   . Osteoporosis   . Stroke    Past Surgical History  Procedure Laterality Date  . Breast lumpectomy  02/23/1965    left  . Exploratory laparotomy with abdominal mass excision  12/08/2010  . Tee without cardioversion N/A 11/25/2012    Procedure: TRANSESOPHAGEAL ECHOCARDIOGRAM (TEE);  Surgeon: Thayer Headings, MD;  Location: [facility] ENDOSCOPY;  Service: Cardiovascular;   Laterality: N/A;    Current Outpatient Prescriptions  Medication Sig Dispense Refill  . amLODipine (NORVASC) 10 MG tablet Take 10 mg by mouth daily.      . calcium carbonate (OS-CAL) 600 MG TABS Take 600 mg by mouth daily.       Marland Kitchen losartan (COZAAR) 100 MG tablet Take 100 mg by mouth daily.        . mirtazapine (REMERON) 15 MG tablet Take 15 mg by mouth at bedtime.      . simvastatin (ZOCOR) 40 MG tablet Take 40 mg by mouth daily.      . Zoledronic Acid (RECLAST IV) Inject into the vein. Once a year       . apixaban (ELIQUIS) 5 MG TABS tablet Take 1 tablet (5 mg total) by mouth 2 (two) times daily.  60 tablet  3   No current facility-administered medications for this visit.    No Known Allergies  History   Social History  . Marital Status: Widowed    Spouse Name: N/A    Number of Children: N/A  . Years of Education: N/A   Occupational History  . Not on file.   Social History Main Topics  . Smoking status: Former Smoker    Quit date: 03/04/2002  . Smokeless tobacco: Never Used  . Alcohol Use: No  . Drug Use: No  . Sexual Activity: Not on file   Other Topics Concern  . Not on file   Social History Narrative  .  No narrative on file    Family History  Problem Relation Age of Onset  . Diabetes Father   . Diabetes Brother     ROS- Positive for residual dysphasia, walks with a walker but no falls, denies palpitations, dizziness, otherwise remaining systems reviewed and negative.  Physical Exam: Filed Vitals:   10/09/13 0910  BP: 141/85  Pulse: 79  Height: $Remove'5\' 2"'IyByOba$  (1.575 m)  Weight: 57.153 kg (126 lb)    GEN- The patient is chronically ill appearing but in no distress, alert and oriented x 3 today.   Head- normocephalic, atraumatic Eyes-  Sclera clear, conjunctiva pink Ears- hearing intact Oropharynx- clear Neck- supple, no JVP Lymph- no cervical lymphadenopathy Lungs- Clear to ausculation bilaterally, normal work of breathing Heart- Regular rate and rhythm,  no murmurs, rubs or gallops, PMI not laterally displaced GI- soft, NT, ND, + BS Extremities- no clubbing, cyanosis, or edema MS- no significant deformity or atrophy Skin- no rash or lesion Psych- euthymic mood, full affect Neuro- strength and sensation are intact  EKG- NSR with sinus arrythmia, NSSTTW changes . QTC 356ms.Linq interoggation reviewed with Dr. Lovena Le showing brief episodes afib with low af burden.  Assessment and Plan:   PAF- linq revealed brief episodes of  Afib from which patient is asymptomatic but with prior CVA, with a chadsvasc score of at least 6. I discussed Coumadin as well as novel anticoagulants including Pradaxa, Xarelto, Savaysa, and Eliquis today as indicated for risk reduction in stroke and systemic emboli with nonvalvular atrial fibrillation.  Risks, benefits, and alternatives to each of these drugs were discussed at length today.  Asa and Plavix will be stopped and Eliquis 5 mg to be started. She was informed to watch for signs of bleeding and report if occurs.   CBC and Bmet today and follow up in anticoagulation clinic in 3 weeks since patient is given 30 day free supply coupon today. Tolerance and affordability will be readdressed at that time with repeat cbc and Bmet.  With low afib burden and asymptomatic,   rate/rhythm control not indicated.  HTN- initially 141/85 but rechecked by me 130/68. Stable, no change in amlodipine 10 mg a day/losartan 100 mg/day needed.  HLD- continue simvastation.  F/u Dr. Lovena Le in 4 months.

## 2013-10-09 NOTE — Patient Instructions (Addendum)
Your physician has recommended you make the following change in your medication:  1. STOP Aspirin 2. STOP Plavix 3. START Eliquis 5mg  take one by mouth twice a day  Your physician recommends that you have lab work today: BMP and CBC  You have been referred to Anticoagulation Clinic in [place] to follow-up on starting Eliquis  Your physician recommends that you schedule a follow-up appointment in: [place] with Dr Lovena Le

## 2013-10-09 NOTE — Progress Notes (Deleted)
      HPI Jane Farley is referred today for evaluation of and consideration for ICD implant.  No Known Allergies   Current Outpatient Prescriptions  Medication Sig Dispense Refill  . amLODipine (NORVASC) 10 MG tablet Take 10 mg by mouth daily.      Marland Kitchen aspirin 325 MG tablet Take 325 mg by mouth daily.      . calcium carbonate (OS-CAL) 600 MG TABS Take 600 mg by mouth daily.       . clopidogrel (PLAVIX) 75 MG tablet Take 1 tablet (75 mg total) by mouth daily.  30 tablet  0  . losartan (COZAAR) 100 MG tablet Take 100 mg by mouth daily.        . mirtazapine (REMERON) 15 MG tablet Take 15 mg by mouth at bedtime.      . simvastatin (ZOCOR) 40 MG tablet Take 40 mg by mouth daily.      . Zoledronic Acid (RECLAST IV) Inject into the vein. Once a year        No current facility-administered medications for this visit.     Past Medical History  Diagnosis Date  . Hemorrhoids   . Nephrolithiasis   . Hypercholesterolemia   . Hypertension   . Osteoporosis   . Stroke     ROS:   All systems reviewed and negative except as noted in the HPI.   Past Surgical History  Procedure Laterality Date  . Breast lumpectomy  02/23/1965    left  . Exploratory laparotomy with abdominal mass excision  12/08/2010  . Tee without cardioversion N/A 11/25/2012    Procedure: TRANSESOPHAGEAL ECHOCARDIOGRAM (TEE);  Surgeon: Thayer Headings, MD;  Location: [facility] ENDOSCOPY;  Service: Cardiovascular;  Laterality: N/A;     Family History  Problem Relation Age of Onset  . Diabetes Father   . Diabetes Brother      History   Social History  . Marital Status: Widowed    Spouse Name: N/A    Number of Children: N/A  . Years of Education: N/A   Occupational History  . Not on file.   Social History Main Topics  . Smoking status: Former Smoker    Quit date: 03/04/2002  . Smokeless tobacco: Never Used  . Alcohol Use: No  . Drug Use: No  . Sexual Activity: Not on file   Other Topics Concern  . Not on  file   Social History Narrative  . No narrative on file     BP 141/85  Pulse 79  Ht 5\' 2"  (1.575 m)  Wt 126 lb (57.153 kg)  BMI 23.04 kg/m2  Physical Exam:  Well appearing NAD HEENT: Unremarkable Neck:  No JVD, no thyromegally Lymphatics:  No adenopathy Back:  No CVA tenderness Lungs:  Clear HEART:  Regular rate rhythm, no murmurs, no rubs, no clicks Abd:  soft, positive bowel sounds, no organomegally, no rebound, no guarding Ext:  2 plus pulses, no edema, no cyanosis, no clubbing Skin:  No rashes no nodules Neuro:  CN II through XII intact, motor grossly intact  EKG  DEVICE  Normal device function.  See PaceArt for details.   Assess/Plan:

## 2013-10-10 ENCOUNTER — Encounter: Payer: Medicare Other | Admitting: Internal Medicine

## 2013-10-16 ENCOUNTER — Encounter: Payer: Self-pay | Admitting: Internal Medicine

## 2013-10-17 ENCOUNTER — Encounter: Payer: Self-pay | Admitting: Internal Medicine

## 2013-10-17 ENCOUNTER — Encounter: Payer: Medicare Other | Admitting: Internal Medicine

## 2013-10-17 DIAGNOSIS — H5210 Myopia, unspecified eye: Secondary | ICD-10-CM | POA: Diagnosis not present

## 2013-10-17 DIAGNOSIS — Z961 Presence of intraocular lens: Secondary | ICD-10-CM | POA: Diagnosis not present

## 2013-10-17 DIAGNOSIS — H5340 Unspecified visual field defects: Secondary | ICD-10-CM | POA: Diagnosis not present

## 2013-10-31 ENCOUNTER — Ambulatory Visit (INDEPENDENT_AMBULATORY_CARE_PROVIDER_SITE_OTHER): Payer: Medicare Other | Admitting: Pharmacist

## 2013-10-31 ENCOUNTER — Ambulatory Visit: Payer: Medicare Other | Admitting: Pharmacist

## 2013-10-31 DIAGNOSIS — I4891 Unspecified atrial fibrillation: Secondary | ICD-10-CM

## 2013-10-31 LAB — CBC
HCT: 34.1 % — ABNORMAL LOW (ref 36.0–46.0)
HEMOGLOBIN: 11.9 g/dL — AB (ref 12.0–15.0)
MCHC: 35 g/dL (ref 30.0–36.0)
MCV: 91.8 fl (ref 78.0–100.0)
Platelets: 266 10*3/uL (ref 150.0–400.0)
RBC: 3.72 Mil/uL — ABNORMAL LOW (ref 3.87–5.11)
RDW: 13.1 % (ref 11.5–15.5)
WBC: 5.7 10*3/uL (ref 4.0–10.5)

## 2013-10-31 NOTE — Progress Notes (Signed)
Pt was started on Eliquis for Afib and history of stroke on 10/09/2013.   Reviewed patients medication list.  Pt is not currently on any combined P-gp and strong CYP3A4 inhibitors/inducers (ketoconazole, traconazole, ritonavir, carbamazepine, phenytoin, rifampin, St. John's wort).  Reviewed labs.  SCr 0.9, Weight 57 kg, CrCl- 46 mL/min.  Dose appropriate based on CrCl.   Hgb and HCT Within Normal Limits  A full discussion of the nature of anticoagulants has been carried out.  A benefit/risk analysis has been presented to the patient, so that they understand the justification for choosing anticoagulation with Eliquis at this time.  The need for compliance is stressed.  Pt is aware to take the medication twice daily.  Side effects of potential bleeding are discussed, including unusual colored urine or stools, coughing up blood or coffee ground emesis, nose bleeds or serious fall or head trauma.  Discussed signs and symptoms of stroke. The patient should avoid any OTC items containing aspirin or ibuprofen.  Avoid alcohol consumption.   Call if any signs of abnormal bleeding.  Discussed financial obligations and resolved any difficulty in obtaining medication.  Next lab test test in 6 months.

## 2013-12-05 ENCOUNTER — Encounter: Payer: Self-pay | Admitting: Internal Medicine

## 2014-01-02 DIAGNOSIS — M859 Disorder of bone density and structure, unspecified: Secondary | ICD-10-CM | POA: Diagnosis not present

## 2014-01-02 DIAGNOSIS — M810 Age-related osteoporosis without current pathological fracture: Secondary | ICD-10-CM | POA: Diagnosis not present

## 2014-01-02 DIAGNOSIS — E785 Hyperlipidemia, unspecified: Secondary | ICD-10-CM | POA: Diagnosis not present

## 2014-01-02 DIAGNOSIS — R8299 Other abnormal findings in urine: Secondary | ICD-10-CM | POA: Diagnosis not present

## 2014-01-02 DIAGNOSIS — I10 Essential (primary) hypertension: Secondary | ICD-10-CM | POA: Diagnosis not present

## 2014-01-03 ENCOUNTER — Ambulatory Visit (INDEPENDENT_AMBULATORY_CARE_PROVIDER_SITE_OTHER): Payer: Medicare Other | Admitting: *Deleted

## 2014-01-03 DIAGNOSIS — I639 Cerebral infarction, unspecified: Secondary | ICD-10-CM | POA: Diagnosis not present

## 2014-01-04 ENCOUNTER — Encounter: Payer: Self-pay | Admitting: Internal Medicine

## 2014-01-05 DIAGNOSIS — Z1389 Encounter for screening for other disorder: Secondary | ICD-10-CM | POA: Diagnosis not present

## 2014-01-05 DIAGNOSIS — R413 Other amnesia: Secondary | ICD-10-CM | POA: Diagnosis not present

## 2014-01-05 DIAGNOSIS — I639 Cerebral infarction, unspecified: Secondary | ICD-10-CM | POA: Diagnosis not present

## 2014-01-05 DIAGNOSIS — N200 Calculus of kidney: Secondary | ICD-10-CM | POA: Diagnosis not present

## 2014-01-05 DIAGNOSIS — Z6823 Body mass index (BMI) 23.0-23.9, adult: Secondary | ICD-10-CM | POA: Diagnosis not present

## 2014-01-05 DIAGNOSIS — E785 Hyperlipidemia, unspecified: Secondary | ICD-10-CM | POA: Diagnosis not present

## 2014-01-05 DIAGNOSIS — Z0000 Encounter for general adult medical examination without abnormal findings: Secondary | ICD-10-CM | POA: Diagnosis not present

## 2014-01-05 DIAGNOSIS — I10 Essential (primary) hypertension: Secondary | ICD-10-CM | POA: Diagnosis not present

## 2014-01-05 DIAGNOSIS — M810 Age-related osteoporosis without current pathological fracture: Secondary | ICD-10-CM | POA: Diagnosis not present

## 2014-01-05 LAB — MDC_IDC_ENUM_SESS_TYPE_REMOTE
Date Time Interrogation Session: 20151012230519
Zone Setting Detection Interval: 2000 ms
Zone Setting Detection Interval: 3000 ms
Zone Setting Detection Interval: 390 ms

## 2014-01-10 NOTE — Progress Notes (Signed)
Loop recorder 

## 2014-02-01 ENCOUNTER — Encounter (HOSPITAL_COMMUNITY): Payer: Self-pay | Admitting: Internal Medicine

## 2014-02-02 ENCOUNTER — Ambulatory Visit (INDEPENDENT_AMBULATORY_CARE_PROVIDER_SITE_OTHER): Payer: Medicare Other | Admitting: *Deleted

## 2014-02-02 DIAGNOSIS — I639 Cerebral infarction, unspecified: Secondary | ICD-10-CM | POA: Diagnosis not present

## 2014-02-06 ENCOUNTER — Encounter: Payer: Medicare Other | Admitting: Internal Medicine

## 2014-02-07 ENCOUNTER — Other Ambulatory Visit: Payer: Self-pay | Admitting: Internal Medicine

## 2014-02-07 NOTE — Progress Notes (Signed)
Loop recorder 

## 2014-02-27 LAB — MDC_IDC_ENUM_SESS_TYPE_REMOTE

## 2014-03-05 ENCOUNTER — Encounter: Payer: Medicare Other | Admitting: *Deleted

## 2014-03-05 ENCOUNTER — Ambulatory Visit (INDEPENDENT_AMBULATORY_CARE_PROVIDER_SITE_OTHER): Payer: Medicare Other | Admitting: *Deleted

## 2014-03-05 DIAGNOSIS — I639 Cerebral infarction, unspecified: Secondary | ICD-10-CM

## 2014-03-05 LAB — MDC_IDC_ENUM_SESS_TYPE_REMOTE

## 2014-03-09 NOTE — Progress Notes (Signed)
Loop recorder 

## 2014-03-13 ENCOUNTER — Encounter: Payer: Self-pay | Admitting: Internal Medicine

## 2014-03-13 ENCOUNTER — Ambulatory Visit (INDEPENDENT_AMBULATORY_CARE_PROVIDER_SITE_OTHER): Payer: Medicare Other | Admitting: Internal Medicine

## 2014-03-13 VITALS — BP 132/78 | HR 86 | Ht 62.0 in | Wt 132.2 lb

## 2014-03-13 DIAGNOSIS — I10 Essential (primary) hypertension: Secondary | ICD-10-CM

## 2014-03-13 DIAGNOSIS — I480 Paroxysmal atrial fibrillation: Secondary | ICD-10-CM

## 2014-03-13 DIAGNOSIS — I639 Cerebral infarction, unspecified: Secondary | ICD-10-CM

## 2014-03-13 DIAGNOSIS — I4891 Unspecified atrial fibrillation: Secondary | ICD-10-CM | POA: Insufficient documentation

## 2014-03-13 DIAGNOSIS — R4701 Aphasia: Secondary | ICD-10-CM

## 2014-03-13 LAB — MDC_IDC_ENUM_SESS_TYPE_INCLINIC
MDC IDC SESS DTM: 20160119134049
Zone Setting Detection Interval: 2000 ms
Zone Setting Detection Interval: 3000 ms
Zone Setting Detection Interval: 390 ms

## 2014-03-13 NOTE — Assessment & Plan Note (Signed)
Her speech is much improved after rehab. No change.

## 2014-03-13 NOTE — Assessment & Plan Note (Signed)
Her blood pressure is well controlled. No change in meds. She will maintain a low sodium diet.

## 2014-03-13 NOTE — Assessment & Plan Note (Signed)
She is mostly asymptomatic and is tolerating her systemic anti-coagulation with warfarin.

## 2014-03-13 NOTE — Progress Notes (Signed)
      HPI Jane Farley returns today for followup. She is a pleasant 81 yo woman with a cryptogenic stroke who underwent insertion of an ILR and has been found to have PAF. She has been on systemic anti-coagulation. She denies chest pain or sob and has had a nice recovery from her stroke after rehab. She denies syncope. No Known Allergies   Current Outpatient Prescriptions  Medication Sig Dispense Refill  . amLODipine (NORVASC) 10 MG tablet Take 10 mg by mouth daily.    . calcium carbonate (OS-CAL) 600 MG TABS Take 600 mg by mouth daily.     Marland Kitchen ELIQUIS 5 MG TABS tablet take 1 tablet by mouth twice a day 60 tablet 5  . losartan (COZAAR) 100 MG tablet Take 100 mg by mouth daily.      . mirtazapine (REMERON) 30 MG tablet Take 1 tablet by mouth at bedtime.  0  . simvastatin (ZOCOR) 40 MG tablet Take 40 mg by mouth daily.    . Zoledronic Acid (RECLAST IV) Inject into the vein. Once a year      No current facility-administered medications for this visit.     Past Medical History  Diagnosis Date  . Hemorrhoids   . Nephrolithiasis   . Hypercholesterolemia   . Hypertension   . Osteoporosis   . Stroke     ROS:   All systems reviewed and negative except as noted in the HPI.   Past Surgical History  Procedure Laterality Date  . Breast lumpectomy  02/23/1965    left  . Exploratory laparotomy with abdominal mass excision  12/08/2010  . Tee without cardioversion N/A 11/25/2012    Procedure: TRANSESOPHAGEAL ECHOCARDIOGRAM (TEE);  Surgeon: Thayer Headings, MD;  Location: [place];  Service: Cardiovascular;  Laterality: N/A;  . Loop recorder implant N/A 11/25/2012    Procedure: LOOP RECORDER IMPLANT;  Surgeon: Evans Lance, MD;  Location: [facility] CATH LAB;  Service: Cardiovascular;  Laterality: N/A;     Family History  Problem Relation Age of Onset  . Diabetes Father   . Diabetes Brother      History   Social History  . Marital Status: Widowed    Spouse Name: N/A    Number of  Children: N/A  . Years of Education: N/A   Occupational History  . Not on file.   Social History Main Topics  . Smoking status: Former Smoker    Quit date: 03/04/2002  . Smokeless tobacco: Never Used  . Alcohol Use: No  . Drug Use: No  . Sexual Activity: Not on file   Other Topics Concern  . Not on file   Social History Narrative     BP 132/78 mmHg  Pulse 86  Ht 5\' 2"  (1.575 m)  Wt 132 lb 3.2 oz (59.966 kg)  BMI 24.17 kg/m2  Physical Exam:  Well appearing 81 yo woman, NAD HEENT: Unremarkable Neck:  No JVD, no thyromegally Back:  No CVA tenderness Lungs:  Clear with no wheezes HEART:  IRegular rate rhythm, no murmurs, no rubs, no clicks Abd:  soft, positive bowel sounds, no organomegally, no rebound, no guarding Ext:  2 plus pulses, no edema, no cyanosis, no clubbing Skin:  No rashes no nodules Neuro:  CN II through XII intact, motor grossly intact   DEVICE  Normal device function.  Multiple episodes of atrial fib with no pauses.    Assess/Plan:

## 2014-03-13 NOTE — Patient Instructions (Signed)
Your physician recommends that you schedule a follow-up appointment as needed  

## 2014-03-19 ENCOUNTER — Encounter: Payer: Self-pay | Admitting: Cardiology

## 2014-03-22 ENCOUNTER — Encounter: Payer: Self-pay | Admitting: Internal Medicine

## 2014-04-03 ENCOUNTER — Ambulatory Visit (INDEPENDENT_AMBULATORY_CARE_PROVIDER_SITE_OTHER): Payer: Medicare Other | Admitting: *Deleted

## 2014-04-03 DIAGNOSIS — I639 Cerebral infarction, unspecified: Secondary | ICD-10-CM

## 2014-04-04 NOTE — Progress Notes (Signed)
Loop recorder 

## 2014-04-11 ENCOUNTER — Encounter: Payer: Self-pay | Admitting: Internal Medicine

## 2014-04-15 LAB — MDC_IDC_ENUM_SESS_TYPE_REMOTE

## 2014-04-30 ENCOUNTER — Encounter: Payer: Self-pay | Admitting: Internal Medicine

## 2014-05-03 ENCOUNTER — Ambulatory Visit (INDEPENDENT_AMBULATORY_CARE_PROVIDER_SITE_OTHER): Payer: Medicare Other | Admitting: *Deleted

## 2014-05-03 DIAGNOSIS — I639 Cerebral infarction, unspecified: Secondary | ICD-10-CM | POA: Diagnosis not present

## 2014-05-04 NOTE — Progress Notes (Signed)
Loop recorder 

## 2014-05-17 LAB — MDC_IDC_ENUM_SESS_TYPE_REMOTE

## 2014-06-01 ENCOUNTER — Ambulatory Visit (INDEPENDENT_AMBULATORY_CARE_PROVIDER_SITE_OTHER): Payer: Medicare Other | Admitting: *Deleted

## 2014-06-01 DIAGNOSIS — I639 Cerebral infarction, unspecified: Secondary | ICD-10-CM | POA: Diagnosis not present

## 2014-06-05 NOTE — Progress Notes (Signed)
Loop recorder 

## 2014-06-08 ENCOUNTER — Encounter: Payer: Self-pay | Admitting: Internal Medicine

## 2014-07-02 ENCOUNTER — Ambulatory Visit (INDEPENDENT_AMBULATORY_CARE_PROVIDER_SITE_OTHER): Payer: Medicare Other | Admitting: *Deleted

## 2014-07-02 DIAGNOSIS — I639 Cerebral infarction, unspecified: Secondary | ICD-10-CM

## 2014-07-03 LAB — CUP PACEART REMOTE DEVICE CHECK: Date Time Interrogation Session: 20160510140731

## 2014-07-05 DIAGNOSIS — I10 Essential (primary) hypertension: Secondary | ICD-10-CM | POA: Diagnosis not present

## 2014-07-05 DIAGNOSIS — E785 Hyperlipidemia, unspecified: Secondary | ICD-10-CM | POA: Diagnosis not present

## 2014-07-05 DIAGNOSIS — I639 Cerebral infarction, unspecified: Secondary | ICD-10-CM | POA: Diagnosis not present

## 2014-07-05 DIAGNOSIS — R413 Other amnesia: Secondary | ICD-10-CM | POA: Diagnosis not present

## 2014-07-05 DIAGNOSIS — Z6823 Body mass index (BMI) 23.0-23.9, adult: Secondary | ICD-10-CM | POA: Diagnosis not present

## 2014-07-05 DIAGNOSIS — F321 Major depressive disorder, single episode, moderate: Secondary | ICD-10-CM | POA: Diagnosis not present

## 2014-07-05 DIAGNOSIS — M810 Age-related osteoporosis without current pathological fracture: Secondary | ICD-10-CM | POA: Diagnosis not present

## 2014-07-06 NOTE — Progress Notes (Signed)
Loop recorder 

## 2014-07-24 LAB — CUP PACEART REMOTE DEVICE CHECK: Date Time Interrogation Session: 20160531143714

## 2014-07-26 ENCOUNTER — Encounter: Payer: Self-pay | Admitting: Internal Medicine

## 2014-08-01 ENCOUNTER — Ambulatory Visit (INDEPENDENT_AMBULATORY_CARE_PROVIDER_SITE_OTHER): Payer: Medicare Other | Admitting: *Deleted

## 2014-08-01 DIAGNOSIS — I639 Cerebral infarction, unspecified: Secondary | ICD-10-CM

## 2014-08-03 NOTE — Progress Notes (Signed)
Loop recorder 

## 2014-08-06 ENCOUNTER — Encounter: Payer: Self-pay | Admitting: Internal Medicine

## 2014-08-10 IMAGING — CT CT ANGIO NECK
1 of 11 series · 4 of 33 positions shown · IV contrast (CONTRAST)
Comparison: MRI and MRA 11/22/2012.

CLINICAL DATA: Brainstem stroke. Evaluate intracranial circulation.

EXAM:
CT ANGIOGRAPHY HEAD AND NECK
TECHNIQUE: Multidetector CT imaging of the head and neck was performed using
the standard protocol during bolus administration of intravenous
contrast. Multiplanar CT image reconstructions including MIPs were
obtained to evaluate the vascular anatomy. Carotid stenosis
measurements (when applicable) are obtained utilizing NASCET
criteria, using the distal internal carotid diameter as the
denominator.
CONTRAST:  Omnipaque 350, 50 mL.

[mpr, ax 1x1 mpr, axial · axial · 0.43mm/px · z∈[+143,+320]mm · 4 of 296 slices shown]
[im 60/296  soft-tissue]
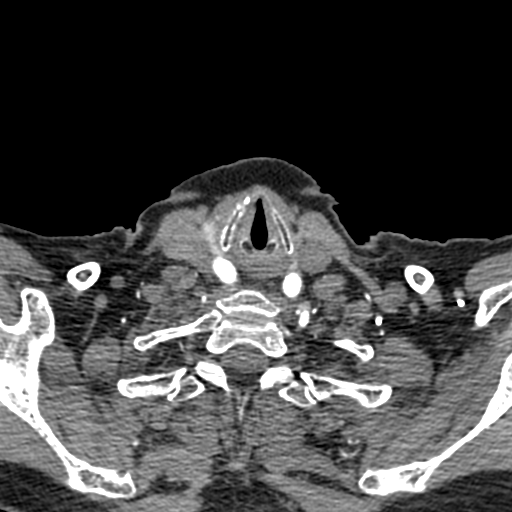
[im 119/296  bone]
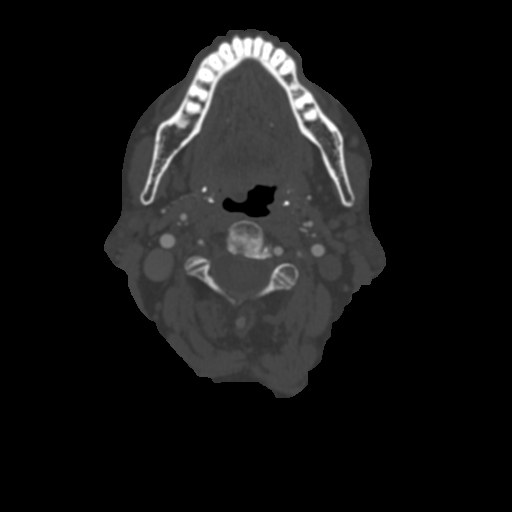
[im 178/296  soft-tissue]
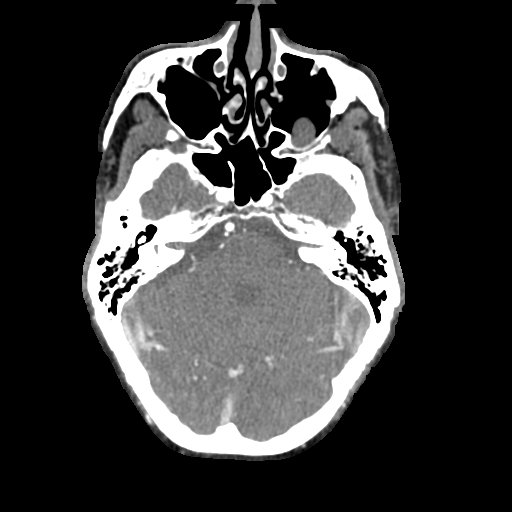
[im 237/296  bone]
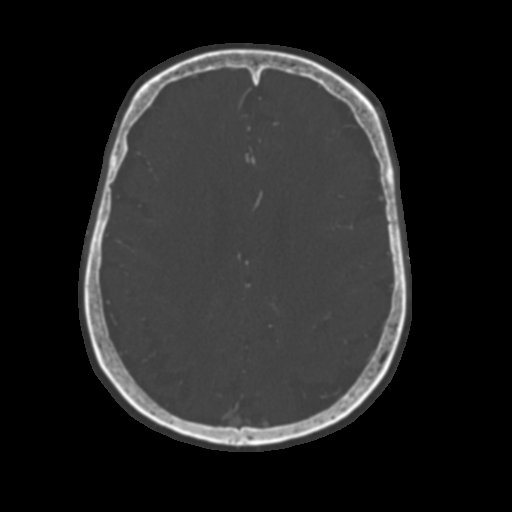

[4 of 33 positions shown; findings below may reference images not displayed]

FINDINGS: CTA HEAD FINDINGS

Non stenotic calcification affects the cavernous and supraclinoid
internal carotid arteries bilaterally. ICA termini are widely
patent.

Mild mural irregularity of the proximal basilar just below the
origin of the AICA (arrows, image 20 series [id number]). There is no
basilar dissection, and no flow-limiting stenosis.

Fetal origin right PCA. Early takeoff M2 branch left MCA. No M1 MCA
stenosis or significant irregularity. No flow-limiting intracranial
stenosis or aneurysm.

Compared with the MRA intracranial, the degree of basilar stenosis
on MRA was overestimated. There does not appear to be a significant
intrinsic posterior circulation stenosis contributing to the
observed brainstem infarct. In addition, what was thought to
represent severe distal right vertebral disease appears to be a
manifestation of a congenitally small vessel.

Re-demonstrated is atrophy with chronic microvascular ischemic
change. Developing cytotoxic edema in the left pontine infarct. No
abnormal postcontrast enhancement. Calvarium intact. Large retention
cyst left maxillary sinus.

Review of the MIP images confirms the above findings.

CTA NECK FINDINGS

No lung apex nodule. No neck masses. Cervical spondylosis.

Heavily calcified transverse arch. Conventional branching. Great
vessel origins partially excluded with no definite proximal flow
limiting lesion. Mural calcification without significant reduction
of the luminal diameter can be seen in the innominate, proximal left
common carotid, and proximal left subclavian.

Heavily calcified non stenotic atheromatous change right carotid
bifurcation. No measurable stenosis with ratio of 4.0/
proximal/distal. Similarly, heavily calcified, but non stenotic
atheromatous change left carotid bifurcation. No measurable internal
carotid artery stenosis with a ratio of 3.9/4.4 proximal/distal. No
evidence for cervical ICA fibromuscular dysplasia or dissection.

The left vertebral is the dominant contributor to the basilar. There
is no ostial stenosis but a small amount of calcification is present
at the left vertebral origin. Due to extreme tortuosity, there is
remodeling of the foramen transversarium on the left at C4. No
fibromuscular dysplasia or dissection. No intracranial stenosis.

The right vertebral is small throughout its course. There is no
ostial stenosis. There is slight reduction in caliber distal to the
right PICA origin which may be physiologic.

Review of the MIP images confirms the above findings.
IMPRESSION: CTA HEAD IMPRESSION

Mild irregularity of the proximal basilar without flow limiting
stenosis or dissection.

Mild non stenotic irregularity both carotid siphons without flow
limiting intracranial stenosis of the anterior circulation.

CTA NECK IMPRESSION

Heavily calcified but non stenotic bilateral carotid bifurcations.
No evidence for carotid fibromuscular dysplasia or dissection.

Left vertebral is the dominant contributor to the posterior
circulation and is free of significant disease.

The right vertebral is small throughout its course, likely
congenitally hypoplastic.

## 2014-08-15 LAB — CUP PACEART REMOTE DEVICE CHECK: MDC IDC SESS DTM: 20160622122422

## 2014-08-28 ENCOUNTER — Other Ambulatory Visit: Payer: Self-pay | Admitting: Internal Medicine

## 2014-08-29 ENCOUNTER — Encounter: Payer: Self-pay | Admitting: Internal Medicine

## 2014-08-31 ENCOUNTER — Ambulatory Visit (INDEPENDENT_AMBULATORY_CARE_PROVIDER_SITE_OTHER): Payer: Medicare Other | Admitting: *Deleted

## 2014-08-31 DIAGNOSIS — I639 Cerebral infarction, unspecified: Secondary | ICD-10-CM | POA: Diagnosis not present

## 2014-09-04 NOTE — Progress Notes (Signed)
Loop recorder 

## 2014-09-12 ENCOUNTER — Encounter: Payer: Self-pay | Admitting: Cardiology

## 2014-09-28 ENCOUNTER — Encounter: Payer: Self-pay | Admitting: Cardiology

## 2014-10-02 LAB — CUP PACEART REMOTE DEVICE CHECK: MDC IDC SESS DTM: 20160809115137

## 2014-10-03 ENCOUNTER — Encounter: Payer: Self-pay | Admitting: Cardiology

## 2014-10-10 ENCOUNTER — Encounter: Payer: Self-pay | Admitting: Cardiology

## 2014-10-11 ENCOUNTER — Encounter: Payer: Self-pay | Admitting: Internal Medicine

## 2014-10-18 ENCOUNTER — Encounter: Payer: Self-pay | Admitting: Cardiology

## 2014-10-23 ENCOUNTER — Telehealth: Payer: Self-pay | Admitting: Internal Medicine

## 2014-10-23 NOTE — Telephone Encounter (Signed)
New message      FYI Pt want [place] to know her son is bringing the transmission box to [place].  She is not going to check it anymore.  She said the doctor told her she did not have to see him anymore but he was going to leave the loop recorder in.---Device clinic wanted message routed to the nurse

## 2014-10-23 NOTE — Telephone Encounter (Signed)
Spoke w/ Jane Farley. She's aware we cannot accept a returned monitor at the device clinic. I let her know she is welcome to call [employer] 800# for a return kit. She's also aware she will not be charged for the monitor. She said she will have her son bring it back home and keep in stored until she decides it's no longer necessary. I let pt know we put her as inactive in device clinic.

## 2014-10-23 NOTE — Telephone Encounter (Signed)
Dr. Lovena Le note on 03/13/14 states ROV PRN. Pt marked inactive in [facility]. Monitor assignment dissociated in [facility].

## 2014-11-08 ENCOUNTER — Encounter: Payer: Self-pay | Admitting: Cardiology

## 2014-12-08 ENCOUNTER — Emergency Department (HOSPITAL_COMMUNITY): Payer: Medicare Other

## 2014-12-08 ENCOUNTER — Encounter (HOSPITAL_COMMUNITY): Payer: Self-pay | Admitting: *Deleted

## 2014-12-08 ENCOUNTER — Observation Stay (HOSPITAL_COMMUNITY)
Admission: EM | Admit: 2014-12-08 | Discharge: 2014-12-10 | Disposition: A | Discharge status: Home Health | Payer: Medicare Other | Attending: Internal Medicine | Admitting: Internal Medicine

## 2014-12-08 DIAGNOSIS — I10 Essential (primary) hypertension: Secondary | ICD-10-CM | POA: Diagnosis present

## 2014-12-08 DIAGNOSIS — M549 Dorsalgia, unspecified: Secondary | ICD-10-CM | POA: Diagnosis not present

## 2014-12-08 DIAGNOSIS — IMO0002 Reserved for concepts with insufficient information to code with codable children: Secondary | ICD-10-CM | POA: Insufficient documentation

## 2014-12-08 DIAGNOSIS — Z79899 Other long term (current) drug therapy: Secondary | ICD-10-CM | POA: Diagnosis not present

## 2014-12-08 DIAGNOSIS — I4891 Unspecified atrial fibrillation: Secondary | ICD-10-CM | POA: Diagnosis present

## 2014-12-08 DIAGNOSIS — S32009A Unspecified fracture of unspecified lumbar vertebra, initial encounter for closed fracture: Secondary | ICD-10-CM | POA: Diagnosis present

## 2014-12-08 DIAGNOSIS — R269 Unspecified abnormalities of gait and mobility: Secondary | ICD-10-CM | POA: Diagnosis not present

## 2014-12-08 DIAGNOSIS — I69198 Other sequelae of nontraumatic intracerebral hemorrhage: Secondary | ICD-10-CM | POA: Diagnosis not present

## 2014-12-08 DIAGNOSIS — R52 Pain, unspecified: Secondary | ICD-10-CM | POA: Diagnosis present

## 2014-12-08 DIAGNOSIS — R1010 Upper abdominal pain, unspecified: Secondary | ICD-10-CM | POA: Insufficient documentation

## 2014-12-08 DIAGNOSIS — Z87891 Personal history of nicotine dependence: Secondary | ICD-10-CM | POA: Diagnosis not present

## 2014-12-08 DIAGNOSIS — R531 Weakness: Secondary | ICD-10-CM | POA: Insufficient documentation

## 2014-12-08 DIAGNOSIS — S32019A Unspecified fracture of first lumbar vertebra, initial encounter for closed fracture: Principal | ICD-10-CM | POA: Insufficient documentation

## 2014-12-08 DIAGNOSIS — M79605 Pain in left leg: Secondary | ICD-10-CM

## 2014-12-08 DIAGNOSIS — I639 Cerebral infarction, unspecified: Secondary | ICD-10-CM | POA: Diagnosis present

## 2014-12-08 DIAGNOSIS — I480 Paroxysmal atrial fibrillation: Secondary | ICD-10-CM | POA: Diagnosis not present

## 2014-12-08 DIAGNOSIS — Z7901 Long term (current) use of anticoagulants: Secondary | ICD-10-CM | POA: Diagnosis not present

## 2014-12-08 DIAGNOSIS — S32010A Wedge compression fracture of first lumbar vertebra, initial encounter for closed fracture: Secondary | ICD-10-CM

## 2014-12-08 DIAGNOSIS — T148 Other injury of unspecified body region: Secondary | ICD-10-CM | POA: Diagnosis not present

## 2014-12-08 DIAGNOSIS — M810 Age-related osteoporosis without current pathological fracture: Secondary | ICD-10-CM | POA: Diagnosis not present

## 2014-12-08 DIAGNOSIS — S0990XA Unspecified injury of head, initial encounter: Secondary | ICD-10-CM | POA: Diagnosis not present

## 2014-12-08 DIAGNOSIS — M545 Low back pain: Secondary | ICD-10-CM | POA: Insufficient documentation

## 2014-12-08 DIAGNOSIS — Y92000 Kitchen of unspecified non-institutional (private) residence as  the place of occurrence of the external cause: Secondary | ICD-10-CM | POA: Diagnosis not present

## 2014-12-08 DIAGNOSIS — M4856XA Collapsed vertebra, not elsewhere classified, lumbar region, initial encounter for fracture: Secondary | ICD-10-CM | POA: Diagnosis not present

## 2014-12-08 DIAGNOSIS — E876 Hypokalemia: Secondary | ICD-10-CM | POA: Diagnosis not present

## 2014-12-08 DIAGNOSIS — W19XXXA Unspecified fall, initial encounter: Secondary | ICD-10-CM | POA: Diagnosis present

## 2014-12-08 DIAGNOSIS — E785 Hyperlipidemia, unspecified: Secondary | ICD-10-CM | POA: Diagnosis not present

## 2014-12-08 DIAGNOSIS — S199XXA Unspecified injury of neck, initial encounter: Secondary | ICD-10-CM | POA: Diagnosis not present

## 2014-12-08 DIAGNOSIS — M79601 Pain in right arm: Secondary | ICD-10-CM | POA: Diagnosis not present

## 2014-12-08 DIAGNOSIS — M546 Pain in thoracic spine: Secondary | ICD-10-CM | POA: Diagnosis not present

## 2014-12-08 HISTORY — DX: Reserved for concepts with insufficient information to code with codable children: IMO0002

## 2014-12-08 LAB — CBC WITH DIFFERENTIAL/PLATELET
Basophils Absolute: 0 10*3/uL (ref 0.0–0.1)
Basophils Relative: 0 %
EOS PCT: 0 %
Eosinophils Absolute: 0 10*3/uL (ref 0.0–0.7)
HCT: 35.5 % — ABNORMAL LOW (ref 36.0–46.0)
Hemoglobin: 11.8 g/dL — ABNORMAL LOW (ref 12.0–15.0)
LYMPHS ABS: 1.8 10*3/uL (ref 0.7–4.0)
LYMPHS PCT: 17 %
MCH: 29.2 pg (ref 26.0–34.0)
MCHC: 33.2 g/dL (ref 30.0–36.0)
MCV: 87.9 fL (ref 78.0–100.0)
MONO ABS: 0.5 10*3/uL (ref 0.1–1.0)
MONOS PCT: 4 %
Neutro Abs: 8.2 10*3/uL — ABNORMAL HIGH (ref 1.7–7.7)
Neutrophils Relative %: 79 %
Platelets: 258 10*3/uL (ref 150–400)
RBC: 4.04 MIL/uL (ref 3.87–5.11)
RDW: 13.1 % (ref 11.5–15.5)
WBC: 10.5 10*3/uL (ref 4.0–10.5)

## 2014-12-08 LAB — COMPREHENSIVE METABOLIC PANEL
ALBUMIN: 3.5 g/dL (ref 3.5–5.0)
ALT: 13 U/L — ABNORMAL LOW (ref 14–54)
AST: 19 U/L (ref 15–41)
Alkaline Phosphatase: 54 U/L (ref 38–126)
Anion gap: 10 (ref 5–15)
BILIRUBIN TOTAL: 0.6 mg/dL (ref 0.3–1.2)
BUN: 12 mg/dL (ref 6–20)
CHLORIDE: 102 mmol/L (ref 101–111)
CO2: 24 mmol/L (ref 22–32)
Calcium: 8.7 mg/dL — ABNORMAL LOW (ref 8.9–10.3)
Creatinine, Ser: 0.72 mg/dL (ref 0.44–1.00)
GFR calc Af Amer: >60 mL/min (ref >60)
GFR calc non Af Amer: >60 mL/min (ref >60)
GLUCOSE: 116 mg/dL — AB (ref 65–99)
POTASSIUM: 3.2 mmol/L — AB (ref 3.5–5.1)
Sodium: 136 mmol/L (ref 135–145)
TOTAL PROTEIN: 6.5 g/dL (ref 6.5–8.1)

## 2014-12-08 LAB — ABO/RH: ABO/RH(D): A POS

## 2014-12-08 LAB — TYPE AND SCREEN
ABO/RH(D): A POS
ANTIBODY SCREEN: NEGATIVE

## 2014-12-08 MED ORDER — ONDANSETRON HCL 4 MG/2ML IJ SOLN
4.0000 mg | Freq: Once | INTRAMUSCULAR | Status: AC
  Administered 2014-12-08: 4 mg via INTRAVENOUS
  Filled 2014-12-08: qty 2

## 2014-12-08 MED ORDER — MORPHINE SULFATE (PF) 2 MG/ML IV SOLN
2.0000 mg | Freq: Once | INTRAVENOUS | Status: DC
  Filled 2014-12-08 (×2): qty 1

## 2014-12-08 MED ORDER — HYDROCODONE-ACETAMINOPHEN 5-325 MG PO TABS
1.0000 | ORAL_TABLET | Freq: Once | ORAL | Status: AC
  Administered 2014-12-08: 1 via ORAL
  Filled 2014-12-08: qty 1

## 2014-12-08 MED ORDER — IOHEXOL 300 MG/ML  SOLN
100.0000 mL | Freq: Once | INTRAMUSCULAR | Status: AC | PRN
  Administered 2014-12-08: 100 mL via INTRAVENOUS

## 2014-12-08 NOTE — ED Notes (Signed)
Pt off unit with CT 

## 2014-12-08 NOTE — ED Notes (Signed)
Patient transported to Radiology 

## 2014-12-08 NOTE — ED Notes (Signed)
Pt refused to ambulate. This RN and the pt's son assisted the pt to a sitting position, the pt stated "I can't breathe! I'm gonna throw up". This RN assisted the pt with deep breathing, and she calmed down. This RN assisted the pt to lay back, and pt calmed down. Pt's son requesting that the pt be admitted over night. Johnney Killian, MD notified.

## 2014-12-08 NOTE — ED Notes (Signed)
Per PT. [place] in her kitchen  . Pt reports she did not hit her head and does not report LOC. Pt fell on her back and RT side. PT now reports lower back pain

## 2014-12-09 ENCOUNTER — Observation Stay (HOSPITAL_COMMUNITY): Payer: Medicare Other

## 2014-12-09 DIAGNOSIS — I480 Paroxysmal atrial fibrillation: Secondary | ICD-10-CM

## 2014-12-09 DIAGNOSIS — M79661 Pain in right lower leg: Secondary | ICD-10-CM | POA: Diagnosis not present

## 2014-12-09 DIAGNOSIS — S32010A Wedge compression fracture of first lumbar vertebra, initial encounter for closed fracture: Secondary | ICD-10-CM | POA: Diagnosis not present

## 2014-12-09 DIAGNOSIS — R52 Pain, unspecified: Secondary | ICD-10-CM | POA: Insufficient documentation

## 2014-12-09 DIAGNOSIS — I10 Essential (primary) hypertension: Secondary | ICD-10-CM | POA: Diagnosis not present

## 2014-12-09 DIAGNOSIS — W19XXXA Unspecified fall, initial encounter: Secondary | ICD-10-CM | POA: Diagnosis not present

## 2014-12-09 DIAGNOSIS — S32009A Unspecified fracture of unspecified lumbar vertebra, initial encounter for closed fracture: Secondary | ICD-10-CM | POA: Diagnosis present

## 2014-12-09 DIAGNOSIS — S32019A Unspecified fracture of first lumbar vertebra, initial encounter for closed fracture: Secondary | ICD-10-CM | POA: Diagnosis not present

## 2014-12-09 LAB — URINALYSIS, ROUTINE W REFLEX MICROSCOPIC
Bilirubin Urine: NEGATIVE
Glucose, UA: NEGATIVE mg/dL
Ketones, ur: NEGATIVE mg/dL
NITRITE: NEGATIVE
PH: 5.5 (ref 5.0–8.0)
Protein, ur: NEGATIVE mg/dL
Specific Gravity, Urine: 1.023 (ref 1.005–1.030)
UROBILINOGEN UA: 0.2 mg/dL (ref 0.0–1.0)

## 2014-12-09 LAB — CBC
HCT: 33 % — ABNORMAL LOW (ref 36.0–46.0)
HEMOGLOBIN: 10.9 g/dL — AB (ref 12.0–15.0)
MCH: 28.9 pg (ref 26.0–34.0)
MCHC: 33 g/dL (ref 30.0–36.0)
MCV: 87.5 fL (ref 78.0–100.0)
Platelets: 240 10*3/uL (ref 150–400)
RBC: 3.77 MIL/uL — AB (ref 3.87–5.11)
RDW: 13.2 % (ref 11.5–15.5)
WBC: 7.1 10*3/uL (ref 4.0–10.5)

## 2014-12-09 LAB — TSH: TSH: 7.841 u[IU]/mL — AB (ref 0.350–4.500)

## 2014-12-09 LAB — URINE MICROSCOPIC-ADD ON

## 2014-12-09 LAB — BASIC METABOLIC PANEL
ANION GAP: 9 (ref 5–15)
BUN: 9 mg/dL (ref 6–20)
CALCIUM: 8.5 mg/dL — AB (ref 8.9–10.3)
CO2: 26 mmol/L (ref 22–32)
Chloride: 105 mmol/L (ref 101–111)
Creatinine, Ser: 0.75 mg/dL (ref 0.44–1.00)
Glucose, Bld: 103 mg/dL — ABNORMAL HIGH (ref 65–99)
POTASSIUM: 3.7 mmol/L (ref 3.5–5.1)
SODIUM: 140 mmol/L (ref 135–145)

## 2014-12-09 LAB — T4, FREE: FREE T4: 0.98 ng/dL (ref 0.61–1.12)

## 2014-12-09 LAB — GLUCOSE, CAPILLARY: GLUCOSE-CAPILLARY: 91 mg/dL (ref 65–99)

## 2014-12-09 MED ORDER — ACETAMINOPHEN 650 MG RE SUPP: 650.0000 mg | Freq: Four times a day (QID) | RECTAL | Status: DC | PRN

## 2014-12-09 MED ORDER — SIMVASTATIN 40 MG PO TABS
40.0000 mg | ORAL_TABLET | Freq: Every day | ORAL | Status: DC
  Administered 2014-12-09 – 2014-12-10 (×2): 40 mg via ORAL
  Filled 2014-12-09 (×2): qty 1

## 2014-12-09 MED ORDER — HYDROCODONE-ACETAMINOPHEN 5-325 MG PO TABS
1.0000 | ORAL_TABLET | ORAL | Status: DC | PRN
  Administered 2014-12-09 (×4): 1 via ORAL
  Filled 2014-12-09 (×4): qty 1

## 2014-12-09 MED ORDER — ONDANSETRON HCL 4 MG PO TABS
4.0000 mg | ORAL_TABLET | Freq: Four times a day (QID) | ORAL | Status: DC | PRN
Start: 2014-12-09 — End: 2014-12-10

## 2014-12-09 MED ORDER — POTASSIUM CHLORIDE CRYS ER 20 MEQ PO TBCR
40.0000 meq | EXTENDED_RELEASE_TABLET | Freq: Once | ORAL | Status: AC
  Administered 2014-12-09: 40 meq via ORAL
  Filled 2014-12-09: qty 2

## 2014-12-09 MED ORDER — MIRTAZAPINE 30 MG PO TABS
30.0000 mg | ORAL_TABLET | Freq: Every day | ORAL | Status: DC
  Administered 2014-12-09: 30 mg via ORAL
  Filled 2014-12-09: qty 1

## 2014-12-09 MED ORDER — SODIUM CHLORIDE 0.9 % IV SOLN
INTRAVENOUS | Status: AC
  Administered 2014-12-09: 04:00:00 via INTRAVENOUS

## 2014-12-09 MED ORDER — LOSARTAN POTASSIUM 50 MG PO TABS
100.0000 mg | ORAL_TABLET | Freq: Every day | ORAL | Status: DC
  Administered 2014-12-09 – 2014-12-10 (×2): 100 mg via ORAL
  Filled 2014-12-09 (×2): qty 2

## 2014-12-09 MED ORDER — SODIUM CHLORIDE 0.9 % IJ SOLN
3.0000 mL | Freq: Two times a day (BID) | INTRAMUSCULAR | Status: DC
Start: 2014-12-09 — End: 2014-12-10
  Administered 2014-12-09 – 2014-12-10 (×2): 3 mL via INTRAVENOUS

## 2014-12-09 MED ORDER — AMLODIPINE BESYLATE 10 MG PO TABS
10.0000 mg | ORAL_TABLET | Freq: Every day | ORAL | Status: DC
  Administered 2014-12-09 – 2014-12-10 (×3): 10 mg via ORAL
  Filled 2014-12-09 (×2): qty 1

## 2014-12-09 MED ORDER — DOCUSATE SODIUM 100 MG PO CAPS
100.0000 mg | ORAL_CAPSULE | Freq: Two times a day (BID) | ORAL | Status: DC
  Administered 2014-12-09 – 2014-12-10 (×4): 100 mg via ORAL
  Filled 2014-12-09 (×4): qty 1

## 2014-12-09 MED ORDER — ACETAMINOPHEN 325 MG PO TABS
650.0000 mg | ORAL_TABLET | Freq: Four times a day (QID) | ORAL | Status: DC | PRN
  Administered 2014-12-10: 650 mg via ORAL
  Filled 2014-12-09: qty 2

## 2014-12-09 MED ORDER — ONDANSETRON HCL 4 MG/2ML IJ SOLN: 4.0000 mg | Freq: Four times a day (QID) | INTRAMUSCULAR | Status: DC | PRN

## 2014-12-09 MED ORDER — CALCIUM CARBONATE 1250 (500 CA) MG PO TABS
1250.0000 mg | ORAL_TABLET | Freq: Every day | ORAL | Status: DC
  Administered 2014-12-09 – 2014-12-10 (×2): 1250 mg via ORAL
  Filled 2014-12-09 (×2): qty 3

## 2014-12-09 MED ORDER — APIXABAN 5 MG PO TABS
5.0000 mg | ORAL_TABLET | Freq: Two times a day (BID) | ORAL | Status: DC
  Administered 2014-12-09 – 2014-12-10 (×3): 5 mg via ORAL
  Filled 2014-12-09 (×3): qty 1

## 2014-12-09 NOTE — H&P (Signed)
[facility] History and Physical  Jane Farley WCH:[id number] DOB: 12-31-1932 DOA: 12/08/2014  Referring physician: ED physician PCP: Haywood Pao, MD   Chief Complaint: fall  HPI:  Jane Farley is a 81yo woman with PMH of HLD, HTN, osteoporosis, CVA with residual weakness and PAF on eliquis who presents after a fall. Jane Farley reports that she was walking with her walker to the kitchen when she caught one foot on her other shoe and fell backwards.  She bumped her head on the refrigerator.  She did lose consciousness and reports no syncope.  When she presented to the ED she reported severe pain in her back and inability to walk, which she confirmed with me.  She reports being in her normal state of health and having no symptoms prior to this happening.  She only has back pain.  She specifically denies lightheadedness, dizziness, blacking out, seizure like activity, headache, chest pain, SOB, nausea, vomiting, diarrhea, slurred speech, worsening weakness, room spinning, change in hearing or tinnitus.    She had a work up in the ED which included a lumbar spine and pelvis xray which showed a new L1 compression fracture (history of osteoporosis), CT head which showed atrophy and old CVAs, CT chest/abdomen/pelvis with no posttraumatic changes, CT cervical spine with no acute changes.  She is on Eliquis for her Afib, but no source of bleeding or hematoma was identified.  She was found to have a low K.  When I talked to her, Jane Farley was mainly complaining of back and right leg pain.    Assessment and Plan:  Fall with Fracture of lumbar spine - Unclear if lumbar fracture new or not, based on imaging.  This would be a fragility fracture due to osteoporosis.  She did not have any point tenderness on the thoracic area of her back - Pain control with Norco and tylenol, may increase dosage or change to IV medication if not controlled with this therapy - PT/OT eval in the AM - Xray of right  tib/fib given reported pain on my exam  Hypokalemia - Replace with oral potassium supplementation  CVA (cerebral infarction) - History of, she reports no change in neuro symptoms and she has equal strength in upper extremities and decreased motion of Right lower extremity due to pain    HTN (hypertension) - BP well controlled, continue home medications of amlodipine and losartan at this time    Hyperlipidemia - Continue home simvastatin    Atrial fibrillation - She is not on any controlling medications, but HR has been below 100 - Continue Eliquis - No sign of ICH on CT head.   Diet: Heart Healthy  DVT PPx: On eliquis    Radiological Exams on Admission: Dg Lumbar Spine Complete  12/08/2014  CLINICAL DATA:  Back pain for 1 day after a fall.  History stroke. EXAM: LUMBAR SPINE - COMPLETE 4+ VIEW COMPARISON:  03/17/2011 and CT of 09/21/2010 FINDINGS: Five lumbar type vertebral bodies. Convex left lumbar spine curvature. Sacroiliac joints are symmetric. Osteopenia. Mild L1 vertebral body height loss is new since the prior. No vertebral canal encroachment. Aortic atherosclerosis with dilatation. 3.7 cm. Loss of intervertebral disc height at the lumbosacral junction. Lower lumbar facet arthropathy. IMPRESSION: Mild L1 vertebral body height loss, new since 09/21/2010. Apparent lower thoracic vertebral body height loss could be due to obliquity on this lumbar dedicated exam. If there are lower thoracic symptoms, consider dedicated radiographs. Atherosclerosis with probable aortic ectasia. Consider further evaluation with ultrasound  or CT. Electronically Signed   By: Abigail Miyamoto M.D.   On: 12/08/2014 18:04   Dg Pelvis 1-2 Views  12/08/2014  CLINICAL DATA:  81 year old female with low back pain for 1 day. History of recent fall onto hard flooring. EXAM: PELVIS - 1-2 VIEW COMPARISON:  No priors. FINDINGS: There is no evidence of pelvic fracture or diastasis. No pelvic bone lesions are seen.  IMPRESSION: Negative. Electronically Signed   By: Vinnie Langton M.D.   On: 12/08/2014 18:01   Ct Head Wo Contrast  12/08/2014  CLINICAL DATA:  Fall in kitchen today. Weakness. Previous stroke. Initial encounter. EXAM: CT HEAD WITHOUT CONTRAST CT CERVICAL SPINE WITHOUT CONTRAST TECHNIQUE: Multidetector CT imaging of the head and cervical spine was performed following the standard protocol without intravenous contrast. Multiplanar CT image reconstructions of the cervical spine were also generated. COMPARISON:  Head CT on 10/19/2012 FINDINGS: CT HEAD FINDINGS There is no evidence of intracranial hemorrhage, brain edema, or other signs of acute infarction. There is no evidence of intracranial mass lesion or mass effect. No abnormal extraaxial fluid collections are identified. Mild cerebral atrophy and chronic small vessel disease again noted. Multiple old lacunar infarcts are seen involving the left basal ganglia and pons. No skull fracture identified. CT CERVICAL SPINE FINDINGS No evidence of acute fracture, subluxation, or prevertebral soft tissue swelling. Mild to moderate degenerative disc disease at all levels of C2-3, C3-4, and C4-5. Mild facet DJD seen bilaterally at C4-5 and C5-6. IMPRESSION: No acute intracranial abnormality identified. Cerebral atrophy, chronic small vessel disease, and multiple old lacunar infarcts. No evidence of acute cervical spine fracture or subluxation. Degenerative spondylosis, as described above. Electronically Signed   By: Earle Gell M.D.   On: 12/08/2014 18:21   Ct Chest W Contrast  12/09/2014  CLINICAL DATA:  Fall tonight. Low thoracic and upper abdominal pain. Right-sided deficit due to old stroke. EXAM: CT CHEST, ABDOMEN, AND PELVIS WITH CONTRAST TECHNIQUE: Multidetector CT imaging of the chest, abdomen and pelvis was performed following the standard protocol during bolus administration of intravenous contrast. CONTRAST:  16mL OMNIPAQUE IOHEXOL 300 MG/ML  SOLN  COMPARISON:  None. FINDINGS: CT CHEST FINDINGS Mediastinum/Nodes: Normal heart size. Normal caliber thoracic aorta. Calcification in the aorta and coronary arteries. Eccentric atherosclerotic wall thickening in the aortic arch. No aortic dissection. Great vessel origins are patent. No mediastinal lymphadenopathy. No abnormal mediastinal gas or fluid collections. Lungs/Pleura: Atelectasis in the lung bases. Focal patchy infiltration or atelectasis in the left mid lung. No definite evidence of consolidation or contusion. Airways are patent. No pleural effusions. No pneumothorax. Musculoskeletal: Normal alignment of the thoracic spine. Posterior elements appear intact. No vertebral compression deformities. Degenerative changes throughout the spine. No acute displaced rib fractures a demonstrated. Sternum is nondepressed. Visualized portions of the shoulders and clavicles appear intact. CT ABDOMEN PELVIS FINDINGS Hepatobiliary: Sub cm low-attenuation lesion consistent with small cysts. Gallbladder and bile ducts are normal. Pancreas: Normal. Spleen: Normal. Adrenals/Urinary Tract: No adrenal gland nodules. Sub cm low-attenuation lesions in the kidneys likely representing cysts. Small parapelvic cyst on the left. No hydronephrosis or hydroureter. Renal nephrograms are symmetrical. Bladder wall is not thickened and no filling defects are demonstrated. Stomach/Bowel: Stomach, small bowel, and colon are not abnormally distended. Anterior abdominal wall hernia containing a portion of the transverse colon without proximal obstruction. Vascular/Lymphatic: Calcification and atherosclerotic change in the abdominal aorta. Infrarenal abdominal aortic aneurysm measuring 3.1 cm AP diameter. No aortic dissection IVC is unremarkable. No retroperitoneal lymphadenopathy. Reproductive:  Uterus and ovaries are not enlarged. No pelvic mass or lymphadenopathy. Other: No free air or free fluid in the abdomen. No free or loculated pelvic fluid  collections. Musculoskeletal: Lumbar scoliosis and degenerative changes. No vertebral compression deformities. Posterior elements appear intact. Sacrum, pelvis, and hips appear intact. IMPRESSION: No acute posttraumatic changes demonstrated in the chest, abdomen, or pelvis. Atelectasis in the lung bases. Atherosclerotic changes throughout the thoracoabdominal aorta. Small abdominal aortic aneurysm measuring 3.1 cm diameter. Ventral abdominal wall hernia containing fat and a portion of the transverse colon. No proximal obstruction. Electronically Signed   By: Lucienne Capers M.D.   On: 12/09/2014 00:10   Ct Cervical Spine Wo Contrast  12/08/2014  CLINICAL DATA:  Fall in kitchen today. Weakness. Previous stroke. Initial encounter. EXAM: CT HEAD WITHOUT CONTRAST CT CERVICAL SPINE WITHOUT CONTRAST TECHNIQUE: Multidetector CT imaging of the head and cervical spine was performed following the standard protocol without intravenous contrast. Multiplanar CT image reconstructions of the cervical spine were also generated. COMPARISON:  Head CT on 10/19/2012 FINDINGS: CT HEAD FINDINGS There is no evidence of intracranial hemorrhage, brain edema, or other signs of acute infarction. There is no evidence of intracranial mass lesion or mass effect. No abnormal extraaxial fluid collections are identified. Mild cerebral atrophy and chronic small vessel disease again noted. Multiple old lacunar infarcts are seen involving the left basal ganglia and pons. No skull fracture identified. CT CERVICAL SPINE FINDINGS No evidence of acute fracture, subluxation, or prevertebral soft tissue swelling. Mild to moderate degenerative disc disease at all levels of C2-3, C3-4, and C4-5. Mild facet DJD seen bilaterally at C4-5 and C5-6. IMPRESSION: No acute intracranial abnormality identified. Cerebral atrophy, chronic small vessel disease, and multiple old lacunar infarcts. No evidence of acute cervical spine fracture or subluxation.  Degenerative spondylosis, as described above. Electronically Signed   By: Earle Gell M.D.   On: 12/08/2014 18:21   Ct Abdomen Pelvis W Contrast  12/09/2014  CLINICAL DATA:  Fall tonight. Low thoracic and upper abdominal pain. Right-sided deficit due to old stroke. EXAM: CT CHEST, ABDOMEN, AND PELVIS WITH CONTRAST TECHNIQUE: Multidetector CT imaging of the chest, abdomen and pelvis was performed following the standard protocol during bolus administration of intravenous contrast. CONTRAST:  159mL OMNIPAQUE IOHEXOL 300 MG/ML  SOLN COMPARISON:  None. FINDINGS: CT CHEST FINDINGS Mediastinum/Nodes: Normal heart size. Normal caliber thoracic aorta. Calcification in the aorta and coronary arteries. Eccentric atherosclerotic wall thickening in the aortic arch. No aortic dissection. Great vessel origins are patent. No mediastinal lymphadenopathy. No abnormal mediastinal gas or fluid collections. Lungs/Pleura: Atelectasis in the lung bases. Focal patchy infiltration or atelectasis in the left mid lung. No definite evidence of consolidation or contusion. Airways are patent. No pleural effusions. No pneumothorax. Musculoskeletal: Normal alignment of the thoracic spine. Posterior elements appear intact. No vertebral compression deformities. Degenerative changes throughout the spine. No acute displaced rib fractures a demonstrated. Sternum is nondepressed. Visualized portions of the shoulders and clavicles appear intact. CT ABDOMEN PELVIS FINDINGS Hepatobiliary: Sub cm low-attenuation lesion consistent with small cysts. Gallbladder and bile ducts are normal. Pancreas: Normal. Spleen: Normal. Adrenals/Urinary Tract: No adrenal gland nodules. Sub cm low-attenuation lesions in the kidneys likely representing cysts. Small parapelvic cyst on the left. No hydronephrosis or hydroureter. Renal nephrograms are symmetrical. Bladder wall is not thickened and no filling defects are demonstrated. Stomach/Bowel: Stomach, small bowel, and  colon are not abnormally distended. Anterior abdominal wall hernia containing a portion of the transverse colon without proximal obstruction. Vascular/Lymphatic:  Calcification and atherosclerotic change in the abdominal aorta. Infrarenal abdominal aortic aneurysm measuring 3.1 cm AP diameter. No aortic dissection IVC is unremarkable. No retroperitoneal lymphadenopathy. Reproductive: Uterus and ovaries are not enlarged. No pelvic mass or lymphadenopathy. Other: No free air or free fluid in the abdomen. No free or loculated pelvic fluid collections. Musculoskeletal: Lumbar scoliosis and degenerative changes. No vertebral compression deformities. Posterior elements appear intact. Sacrum, pelvis, and hips appear intact. IMPRESSION: No acute posttraumatic changes demonstrated in the chest, abdomen, or pelvis. Atelectasis in the lung bases. Atherosclerotic changes throughout the thoracoabdominal aorta. Small abdominal aortic aneurysm measuring 3.1 cm diameter. Ventral abdominal wall hernia containing fat and a portion of the transverse colon. No proximal obstruction. Electronically Signed   By: Lucienne Capers M.D.   On: 12/09/2014 00:10     Code Status: Full Family Communication: Pt at bedside Disposition Plan: Admit for further evaluation    Gilles Chiquito, MD [phone]   Review of Systems:  Constitutional: Negative for fever, chills and malaise/fatigue. Negative for diaphoresis.  HENT: Negative for hearing loss, ear pain Eyes: Negative for blurred vision, double vision Respiratory: Negative for cough, shortness of breath, wheezing Cardiovascular: Negative for chest pain, palpitations, orthopnea Gastrointestinal: Negative for nausea, vomiting and abdominal pain Genitourinary: Negative for dysuria, urgency, frequency Musculoskeletal: + for fall, leg pain, back pain Skin: Negative for itching and rash.  Neurological: + for chronic leg weakness post stroke, uses walker Negative for dizziness and new  weakness. Psychiatric/Behavioral: Somewhat anxious about being admitted    Past Medical History  Diagnosis Date  . Hemorrhoids   . Nephrolithiasis   . Hypercholesterolemia   . Hypertension   . Osteoporosis   . Stroke ([place])   . Weakness due to cerebrovascular accident     Residual RT sided weakness from previous CVA    Past Surgical History  Procedure Laterality Date  . Breast lumpectomy  02/23/1965    left  . Exploratory laparotomy with abdominal mass excision  12/08/2010  . Tee without cardioversion N/A 11/25/2012    Procedure: TRANSESOPHAGEAL ECHOCARDIOGRAM (TEE);  Surgeon: Thayer Headings, MD;  Location: [place];  Service: Cardiovascular;  Laterality: N/A;  . Loop recorder implant N/A 11/25/2012    Procedure: LOOP RECORDER IMPLANT;  Surgeon: Evans Lance, MD;  Location: [facility] CATH LAB;  Service: Cardiovascular;  Laterality: N/A;    Social History:  reports that she quit smoking about 12 years ago. She has never used smokeless tobacco. She reports that she does not drink alcohol or use illicit drugs.  No Known Allergies  Family History  Problem Relation Age of Onset  . Diabetes Father   . Diabetes Brother     Prior to Admission medications   Medication Sig Start Date End Date Taking? Authorizing Provider  amLODipine (NORVASC) 10 MG tablet Take 10 mg by mouth daily.   Yes Historical Provider, MD  calcium carbonate (OS-CAL) 600 MG TABS Take 600 mg by mouth daily.    Yes Historical Provider, MD  ELIQUIS 5 MG TABS tablet take 1 tablet by mouth twice a day 08/29/14  Yes Evans Lance, MD  losartan (COZAAR) 100 MG tablet Take 100 mg by mouth daily.     Yes Historical Provider, MD  mirtazapine (REMERON) 30 MG tablet Take 30 mg by mouth at bedtime. 10/23/14  Yes Historical Provider, MD  simvastatin (ZOCOR) 40 MG tablet Take 40 mg by mouth daily.   Yes Historical Provider, MD  Zoledronic Acid (RECLAST IV) Inject into the  vein. Once a year    Yes Historical Provider, MD     Physical Exam: Filed Vitals:   12/08/14 2315 12/09/14 0030 12/09/14 0100 12/09/14 0130  BP: 121/59 111/58 128/53 112/51  Pulse: 67 66 83 67  Temp:      TempSrc:      Resp:      SpO2: 97% 93% 93% 95%    Physical Exam  Constitutional: Older woman, somewhat disheveled  HENT: Normocephalic. Oropharynx is clear and moist.  MMM Eyes: Conjunctivae are normal. PERRLA, no scleral icterus.  Neck: Normal ROM. Neck supple.  CVS: RR, NR, S1/S2 +, no murmurs Pulmonary: Effort and breath sounds normal, CTAB Abdominal: Soft. BS +,  no distension, tenderness Musculoskeletal: Tenderness to right lower extremity over tibial prominence, she does have what appears to be a healing wound there (does not look acute), she has no point tenderness over T or L spine on my exam.  Neuro: Alert. Normal muscle tone. Somewhat decreased movement of right LE due to pain, otherwise MAE and appears equal on both sides.  No slurred speech. Skin: Skin is warm and dry. No rash noted  Labs on Admission:  Basic Metabolic Panel:  Recent Labs Lab 12/08/14 2026  NA 136  K 3.2*  CL 102  CO2 24  GLUCOSE 116*  BUN 12  CREATININE 0.72  CALCIUM 8.7*   Liver Function Tests:  Recent Labs Lab 12/08/14 2026  AST 19  ALT 13*  ALKPHOS 54  BILITOT 0.6  PROT 6.5  ALBUMIN 3.5   CBC:  Recent Labs Lab 12/08/14 2026  WBC 10.5  NEUTROABS 8.2*  HGB 11.8*  HCT 35.5*  MCV 87.9  PLT 258    If 7PM-7AM, please contact night-coverage [email or web address] Password [email or web address] 12/09/2014, 1:38 AM

## 2014-12-09 NOTE — ED Provider Notes (Signed)
CSN: [id number]     Arrival date & time 12/08/14  32 History   First MD Initiated Contact with Patient 12/08/14 1612     Chief Complaint  Patient presents with  . Fall     (Consider location/radiation/quality/duration/timing/severity/associated sxs/prior Treatment) HPI The patient does have a history of stroke. She does live independently. She reports she was in her home and somehow she lost her balance and fell backwards. She reports she went backwards she thinks she landed first on her buttock and then hit her head on her refrigerator. She states she has severe pain in her central lower back. She denies paresthesia or numbness however she does have prior history of stroke. She does not think that she got knocked out when she hit her head. She reports she has some tenderness at the back of her head . She has had no nausea or vomiting. No visual changes. Patient was brought by medics with a c-collar in place. The patient is on anticoagulants. Past Medical History  Diagnosis Date  . Hemorrhoids   . Nephrolithiasis   . Hypercholesterolemia   . Hypertension   . Osteoporosis   . Stroke ([place])   . Weakness due to cerebrovascular accident     Residual RT sided weakness from previous CVA   Past Surgical History  Procedure Laterality Date  . Breast lumpectomy  02/23/1965    left  . Exploratory laparotomy with abdominal mass excision  12/08/2010  . Tee without cardioversion N/A 11/25/2012    Procedure: TRANSESOPHAGEAL ECHOCARDIOGRAM (TEE);  Surgeon: Thayer Headings, MD;  Location: [facility];  Service: Cardiovascular;  Laterality: N/A;  . Loop recorder implant N/A 11/25/2012    Procedure: LOOP RECORDER IMPLANT;  Surgeon: Evans Lance, MD;  Location: [facility] CATH LAB;  Service: Cardiovascular;  Laterality: N/A;   Family History  Problem Relation Age of Onset  . Diabetes Father   . Diabetes Brother    Social History  Substance Use Topics  . Smoking status: Former Smoker    Quit date:  03/04/2002  . Smokeless tobacco: Never Used  . Alcohol Use: No   OB History    No data available     Review of Systems  10 Systems reviewed and are negative for acute change except as noted in the HPI.   Allergies  Review of patient's allergies indicates no known allergies.  Home Medications   Prior to Admission medications   Medication Sig Start Date End Date Taking? Authorizing Provider  amLODipine (NORVASC) 10 MG tablet Take 10 mg by mouth daily.   Yes Historical Provider, MD  calcium carbonate (OS-CAL) 600 MG TABS Take 600 mg by mouth daily.    Yes Historical Provider, MD  ELIQUIS 5 MG TABS tablet take 1 tablet by mouth twice a day 08/29/14  Yes Evans Lance, MD  losartan (COZAAR) 100 MG tablet Take 100 mg by mouth daily.     Yes Historical Provider, MD  mirtazapine (REMERON) 30 MG tablet Take 30 mg by mouth at bedtime. 10/23/14  Yes Historical Provider, MD  simvastatin (ZOCOR) 40 MG tablet Take 40 mg by mouth daily.   Yes Historical Provider, MD  Zoledronic Acid (RECLAST IV) Inject into the vein. Once a year    Yes Historical Provider, MD   BP 121/59 mmHg  Pulse 67  Temp(Src) 98.4 F (36.9 C) (Oral)  Resp 23  SpO2 97% Physical Exam  Constitutional: She is oriented to person, place, and time. She appears well-developed and well-nourished.  The patient is alert and cognitively appropriate. She does have mildly slurred speech.  HENT:  Head: Normocephalic and atraumatic.  Right Ear: External ear normal.  Left Ear: External ear normal.  Nose: Nose normal.  Mouth/Throat: Oropharynx is clear and moist.  Eyes: EOM are normal. Pupils are equal, round, and reactive to light.  Neck: Neck supple.  C-collar in place.  Cardiovascular: Normal rate, regular rhythm, normal heart sounds and intact distal pulses.   Pulmonary/Chest: Effort normal and breath sounds normal. She exhibits tenderness.  Abdominal: Soft. Bowel sounds are normal. She exhibits no distension. There is no  tenderness.  Musculoskeletal: Normal range of motion. She exhibits tenderness. She exhibits no edema.  Patient endorses severe pain in her low thoracic and high lumbar spine. Also pain across the right lower thoracic and CVA region. No obvious hematoma or abrasion.  Neurological: She is alert and oriented to person, place, and time. She has normal strength. No cranial nerve deficit. She exhibits normal muscle tone. Coordination normal. GCS eye subscore is 4. GCS verbal subscore is 5. GCS motor subscore is 6.  Skin: Skin is warm, dry and intact.  Psychiatric: She has a normal mood and affect.    ED Course  Procedures (including critical care time) Labs Review Labs Reviewed  COMPREHENSIVE METABOLIC PANEL - Abnormal; Notable for the following:    Potassium 3.2 (*)    Glucose, Bld 116 (*)    Calcium 8.7 (*)    ALT 13 (*)    All other components within normal limits  CBC WITH DIFFERENTIAL/PLATELET - Abnormal; Notable for the following:    Hemoglobin 11.8 (*)    HCT 35.5 (*)    Neutro Abs 8.2 (*)    All other components within normal limits  URINALYSIS, ROUTINE W REFLEX MICROSCOPIC (NOT AT [facility])  TYPE AND SCREEN  ABO/RH    Imaging Review Dg Lumbar Spine Complete  12/08/2014  CLINICAL DATA:  Back pain for 1 day after a fall.  History stroke. EXAM: LUMBAR SPINE - COMPLETE 4+ VIEW COMPARISON:  03/17/2011 and CT of 09/21/2010 FINDINGS: Five lumbar type vertebral bodies. Convex left lumbar spine curvature. Sacroiliac joints are symmetric. Osteopenia. Mild L1 vertebral body height loss is new since the prior. No vertebral canal encroachment. Aortic atherosclerosis with dilatation. 3.7 cm. Loss of intervertebral disc height at the lumbosacral junction. Lower lumbar facet arthropathy. IMPRESSION: Mild L1 vertebral body height loss, new since 09/21/2010. Apparent lower thoracic vertebral body height loss could be due to obliquity on this lumbar dedicated exam. If there are lower thoracic symptoms,  consider dedicated radiographs. Atherosclerosis with probable aortic ectasia. Consider further evaluation with ultrasound or CT. Electronically Signed   By: Abigail Miyamoto M.D.   On: 12/08/2014 18:04   Dg Pelvis 1-2 Views  12/08/2014  CLINICAL DATA:  81 year old female with low back pain for 1 day. History of recent fall onto hard flooring. EXAM: PELVIS - 1-2 VIEW COMPARISON:  No priors. FINDINGS: There is no evidence of pelvic fracture or diastasis. No pelvic bone lesions are seen. IMPRESSION: Negative. Electronically Signed   By: Vinnie Langton M.D.   On: 12/08/2014 18:01   Ct Head Wo Contrast  12/08/2014  CLINICAL DATA:  Fall in kitchen today. Weakness. Previous stroke. Initial encounter. EXAM: CT HEAD WITHOUT CONTRAST CT CERVICAL SPINE WITHOUT CONTRAST TECHNIQUE: Multidetector CT imaging of the head and cervical spine was performed following the standard protocol without intravenous contrast. Multiplanar CT image reconstructions of the cervical spine were also generated.  COMPARISON:  Head CT on 10/19/2012 FINDINGS: CT HEAD FINDINGS There is no evidence of intracranial hemorrhage, brain edema, or other signs of acute infarction. There is no evidence of intracranial mass lesion or mass effect. No abnormal extraaxial fluid collections are identified. Mild cerebral atrophy and chronic small vessel disease again noted. Multiple old lacunar infarcts are seen involving the left basal ganglia and pons. No skull fracture identified. CT CERVICAL SPINE FINDINGS No evidence of acute fracture, subluxation, or prevertebral soft tissue swelling. Mild to moderate degenerative disc disease at all levels of C2-3, C3-4, and C4-5. Mild facet DJD seen bilaterally at C4-5 and C5-6. IMPRESSION: No acute intracranial abnormality identified. Cerebral atrophy, chronic small vessel disease, and multiple old lacunar infarcts. No evidence of acute cervical spine fracture or subluxation. Degenerative spondylosis, as described above.  Electronically Signed   By: Earle Gell M.D.   On: 12/08/2014 18:21   Ct Chest W Contrast  12/09/2014  CLINICAL DATA:  Fall tonight. Low thoracic and upper abdominal pain. Right-sided deficit due to old stroke. EXAM: CT CHEST, ABDOMEN, AND PELVIS WITH CONTRAST TECHNIQUE: Multidetector CT imaging of the chest, abdomen and pelvis was performed following the standard protocol during bolus administration of intravenous contrast. CONTRAST:  13mL OMNIPAQUE IOHEXOL 300 MG/ML  SOLN COMPARISON:  None. FINDINGS: CT CHEST FINDINGS Mediastinum/Nodes: Normal heart size. Normal caliber thoracic aorta. Calcification in the aorta and coronary arteries. Eccentric atherosclerotic wall thickening in the aortic arch. No aortic dissection. Great vessel origins are patent. No mediastinal lymphadenopathy. No abnormal mediastinal gas or fluid collections. Lungs/Pleura: Atelectasis in the lung bases. Focal patchy infiltration or atelectasis in the left mid lung. No definite evidence of consolidation or contusion. Airways are patent. No pleural effusions. No pneumothorax. Musculoskeletal: Normal alignment of the thoracic spine. Posterior elements appear intact. No vertebral compression deformities. Degenerative changes throughout the spine. No acute displaced rib fractures a demonstrated. Sternum is nondepressed. Visualized portions of the shoulders and clavicles appear intact. CT ABDOMEN PELVIS FINDINGS Hepatobiliary: Sub cm low-attenuation lesion consistent with small cysts. Gallbladder and bile ducts are normal. Pancreas: Normal. Spleen: Normal. Adrenals/Urinary Tract: No adrenal gland nodules. Sub cm low-attenuation lesions in the kidneys likely representing cysts. Small parapelvic cyst on the left. No hydronephrosis or hydroureter. Renal nephrograms are symmetrical. Bladder wall is not thickened and no filling defects are demonstrated. Stomach/Bowel: Stomach, small bowel, and colon are not abnormally distended. Anterior abdominal  wall hernia containing a portion of the transverse colon without proximal obstruction. Vascular/Lymphatic: Calcification and atherosclerotic change in the abdominal aorta. Infrarenal abdominal aortic aneurysm measuring 3.1 cm AP diameter. No aortic dissection IVC is unremarkable. No retroperitoneal lymphadenopathy. Reproductive: Uterus and ovaries are not enlarged. No pelvic mass or lymphadenopathy. Other: No free air or free fluid in the abdomen. No free or loculated pelvic fluid collections. Musculoskeletal: Lumbar scoliosis and degenerative changes. No vertebral compression deformities. Posterior elements appear intact. Sacrum, pelvis, and hips appear intact. IMPRESSION: No acute posttraumatic changes demonstrated in the chest, abdomen, or pelvis. Atelectasis in the lung bases. Atherosclerotic changes throughout the thoracoabdominal aorta. Small abdominal aortic aneurysm measuring 3.1 cm diameter. Ventral abdominal wall hernia containing fat and a portion of the transverse colon. No proximal obstruction. Electronically Signed   By: Lucienne Capers M.D.   On: 12/09/2014 00:10   Ct Cervical Spine Wo Contrast  12/08/2014  CLINICAL DATA:  Fall in kitchen today. Weakness. Previous stroke. Initial encounter. EXAM: CT HEAD WITHOUT CONTRAST CT CERVICAL SPINE WITHOUT CONTRAST TECHNIQUE: Multidetector CT imaging of  the head and cervical spine was performed following the standard protocol without intravenous contrast. Multiplanar CT image reconstructions of the cervical spine were also generated. COMPARISON:  Head CT on 10/19/2012 FINDINGS: CT HEAD FINDINGS There is no evidence of intracranial hemorrhage, brain edema, or other signs of acute infarction. There is no evidence of intracranial mass lesion or mass effect. No abnormal extraaxial fluid collections are identified. Mild cerebral atrophy and chronic small vessel disease again noted. Multiple old lacunar infarcts are seen involving the left basal ganglia and  pons. No skull fracture identified. CT CERVICAL SPINE FINDINGS No evidence of acute fracture, subluxation, or prevertebral soft tissue swelling. Mild to moderate degenerative disc disease at all levels of C2-3, C3-4, and C4-5. Mild facet DJD seen bilaterally at C4-5 and C5-6. IMPRESSION: No acute intracranial abnormality identified. Cerebral atrophy, chronic small vessel disease, and multiple old lacunar infarcts. No evidence of acute cervical spine fracture or subluxation. Degenerative spondylosis, as described above. Electronically Signed   By: Earle Gell M.D.   On: 12/08/2014 18:21   Ct Abdomen Pelvis W Contrast  12/09/2014  CLINICAL DATA:  Fall tonight. Low thoracic and upper abdominal pain. Right-sided deficit due to old stroke. EXAM: CT CHEST, ABDOMEN, AND PELVIS WITH CONTRAST TECHNIQUE: Multidetector CT imaging of the chest, abdomen and pelvis was performed following the standard protocol during bolus administration of intravenous contrast. CONTRAST:  178mL OMNIPAQUE IOHEXOL 300 MG/ML  SOLN COMPARISON:  None. FINDINGS: CT CHEST FINDINGS Mediastinum/Nodes: Normal heart size. Normal caliber thoracic aorta. Calcification in the aorta and coronary arteries. Eccentric atherosclerotic wall thickening in the aortic arch. No aortic dissection. Great vessel origins are patent. No mediastinal lymphadenopathy. No abnormal mediastinal gas or fluid collections. Lungs/Pleura: Atelectasis in the lung bases. Focal patchy infiltration or atelectasis in the left mid lung. No definite evidence of consolidation or contusion. Airways are patent. No pleural effusions. No pneumothorax. Musculoskeletal: Normal alignment of the thoracic spine. Posterior elements appear intact. No vertebral compression deformities. Degenerative changes throughout the spine. No acute displaced rib fractures a demonstrated. Sternum is nondepressed. Visualized portions of the shoulders and clavicles appear intact. CT ABDOMEN PELVIS FINDINGS  Hepatobiliary: Sub cm low-attenuation lesion consistent with small cysts. Gallbladder and bile ducts are normal. Pancreas: Normal. Spleen: Normal. Adrenals/Urinary Tract: No adrenal gland nodules. Sub cm low-attenuation lesions in the kidneys likely representing cysts. Small parapelvic cyst on the left. No hydronephrosis or hydroureter. Renal nephrograms are symmetrical. Bladder wall is not thickened and no filling defects are demonstrated. Stomach/Bowel: Stomach, small bowel, and colon are not abnormally distended. Anterior abdominal wall hernia containing a portion of the transverse colon without proximal obstruction. Vascular/Lymphatic: Calcification and atherosclerotic change in the abdominal aorta. Infrarenal abdominal aortic aneurysm measuring 3.1 cm AP diameter. No aortic dissection IVC is unremarkable. No retroperitoneal lymphadenopathy. Reproductive: Uterus and ovaries are not enlarged. No pelvic mass or lymphadenopathy. Other: No free air or free fluid in the abdomen. No free or loculated pelvic fluid collections. Musculoskeletal: Lumbar scoliosis and degenerative changes. No vertebral compression deformities. Posterior elements appear intact. Sacrum, pelvis, and hips appear intact. IMPRESSION: No acute posttraumatic changes demonstrated in the chest, abdomen, or pelvis. Atelectasis in the lung bases. Atherosclerotic changes throughout the thoracoabdominal aorta. Small abdominal aortic aneurysm measuring 3.1 cm diameter. Ventral abdominal wall hernia containing fat and a portion of the transverse colon. No proximal obstruction. Electronically Signed   By: Lucienne Capers M.D.   On: 12/09/2014 00:10   I have personally reviewed and evaluated these images and  lab results as part of my medical decision-making.   EKG Interpretation None      MDM   Final diagnoses:  Fall, initial encounter  Compression fracture of L1 lumbar vertebra, closed, initial encounter ([place])  Intractable pain  Neurologic  gait dysfunction   Patient took what sounds to be a mechanical fall in her home. She is on Eliquis for stroke history. At this time diagnostic studies identify vertebral compression fracture but she has ruled out for intracranial bleed or serious intrathoracic or abdominal injury. The patient has attempted to ambulate with a walker. She is experiencing severe pain with any movement or attempt at using her walker. She became diaphoretic and nauseated and at this point time plan will be for admission with pain control and PT assessment for home functioning.    Charlesetta Shanks, MD 12/09/14 [id number]

## 2014-12-09 NOTE — Evaluation (Signed)
Physical Therapy Evaluation Patient Details Name: Jane Farley MRN: [id number] DOB: [birth year] Today's Date: 12/09/2014   History of Present Illness    81yo woman with PMH of HLD, HTN, osteoporosis, CVA with residual weakness and PAF on eliquis who presents after a fall. Ms. Macmullen reports that she was walking with her walker to the kitchen when she caught one foot on her other shoe and fell backwards. She bumped her head on the refrigerator. She did lose consciousness and reports no syncope. When she presented to the ED she reported severe pain in her back and inability to walk.  Imaging shows - Lumbosacral spine with evidence of L1 mild body high loss, but no evidence of fracture, CT abdomen pelvis with no evidence of fracture as well.   Clinical Impression  Pt presents with severe limitations to functional mobility related to pain following injurious fall.  Able to move to standing with moderate to maximal assist but unable to tolerate due to onset severe pain.  Pt lives alone baseline and reports independence with all ADL.  Will benefit from postacute care PT, likely SNF is best option.  Pt prefers the same facility where her sister is, but cannot recall the name currently.  Recommend initiate PT acutely, work with nursing for OOB mobility and coordinate to premedicate before PT sessions.  Consider ice for acute pain control if pt will tolerate.  See below for further details of exam.    Follow Up Recommendations SNF;Supervision/Assistance - 24 hour (wants same facility as sister, ?name)    Equipment Recommendations  None recommended by PT    Recommendations for Other Services       Precautions / Restrictions Precautions Precautions: Fall;Back (L1 ) Precaution Booklet Issued: No      Mobility  Bed Mobility Overal bed mobility: Needs Assistance Bed Mobility: Sit to Supine;Supine to Sit     Supine to sit: Mod assist;HOB elevated Sit to supine: Total assist   General bed  mobility comments: states she can do it at home, that she rolls onto side normally, but unable to demostrate and unable to tolerate side>supine therefore total assist to supine   Transfers Overall transfer level: Needs assistance Equipment used: Rolling walker (2 wheeled) Transfers: Sit to/from Stand Sit to Stand: Mod assist         General transfer comment: slow to rise, needs assist to place RUE to grip; experiences sudden pain and nausea and begged to lie back down.  Repositioned in best comfortable position  Ambulation/Gait Ambulation/Gait assistance:  (unable due to pain)              Stairs            Wheelchair Mobility    Modified Rankin (Stroke Patients Only)       Balance Overall balance assessment: History of Falls                                           Pertinent Vitals/Pain Pain Assessment: Faces Pain Score: 9  Faces Pain Scale: Hurts worst Pain Location: back Pain Descriptors / Indicators: Grimacing;Guarding;Penetrating;Spasm;Stabbing Pain Intervention(s): Limited activity within patient's tolerance;Monitored during session;Premedicated before session;Repositioned    Home Living Family/patient expects to be discharged to:: Private residence Living Arrangements: Alone Available Help at Discharge: Family;Available PRN/intermittently Type of Home: House           Additional Comments: son  grocery shops, pt claims to perform all other ADLs    Prior Function Level of Independence: Independent with assistive device(s)         Comments: RW     Hand Dominance        Extremity/Trunk Assessment   Upper Extremity Assessment: RUE deficits/detail RUE Deficits / Details: residual weakness and flexion contracture/tone         Lower Extremity Assessment: Generalized weakness         Communication   Communication: No difficulties  Cognition Arousal/Alertness: Awake/alert Behavior During Therapy: WFL for tasks  assessed/performed Overall Cognitive Status: Within Functional Limits for tasks assessed                      General Comments General comments (skin integrity, edema, etc.): dry flaky skin, waiting to be bathed    Exercises        Assessment/Plan    PT Assessment Patient needs continued PT services  PT Diagnosis Acute pain;Difficulty walking   PT Problem List Decreased strength;Decreased range of motion;Decreased activity tolerance;Decreased mobility;Impaired tone;Decreased skin integrity;Pain  PT Treatment Interventions Modalities;Patient/family education;Therapeutic exercise;Therapeutic activities;Functional mobility training;Gait training;DME instruction   PT Goals (Current goals can be found in the Care Plan section) Acute Rehab PT Goals Patient Stated Goal: live alone/independently again PT Goal Formulation: With patient Time For Goal Achievement: 12/23/14 Potential to Achieve Goals: Good    Frequency Min 3X/week   Barriers to discharge Decreased caregiver support lives alone    Co-evaluation               End of Session   Activity Tolerance: Patient limited by pain Patient left: in bed;with call bell/phone within reach Nurse Communication: Mobility status    Functional Assessment Tool Used: clinical judgement Functional Limitation: Changing and maintaining body position Changing and Maintaining Body Position Current Status (C5885): At least 60 percent but less than 80 percent impaired, limited or restricted Changing and Maintaining Body Position Goal Status (O2774): At least 20 percent but less than 40 percent impaired, limited or restricted    Time: 1447-1501 PT Time Calculation (min) (ACUTE ONLY): 14 min   Charges:   PT Evaluation $Initial PT Evaluation Tier I: 1 Procedure     PT G Codes:   PT G-Codes **NOT FOR INPATIENT CLASS** Functional Assessment Tool Used: clinical judgement Functional Limitation: Changing and maintaining body  position Changing and Maintaining Body Position Current Status (J2878): At least 60 percent but less than 80 percent impaired, limited or restricted Changing and Maintaining Body Position Goal Status (M7672): At least 20 percent but less than 40 percent impaired, limited or restricted    Herbie Drape 12/09/2014, 3:06 PM

## 2014-12-09 NOTE — Progress Notes (Signed)
Patient Demographics  Jane Farley, is a 81 y.o. female, DOB - 06-06-1932, PYP:[id number]  Admit date - 12/08/2014   Admitting Physician Sid Falcon, MD  Outpatient Primary MD for the patient is Haywood Pao, MD  LOS -    Chief Complaint  Patient presents with  . Fall         Subjective:   Jane Farley today has, No headache, No chest pain, No abdominal pain - No Nausea, No new weakness tingling or numbness, No Cough - SOB. Reports back pain significantly subsided.  Assessment & Plan    Principal Problem:   Fall Active Problems:   CVA (cerebral infarction)   HTN (hypertension)   Hyperlipidemia   Atrial fibrillation (HCC)   Fracture of lumbar spine ([place])  Fall with lower back pain - Lumbosacral spine with evidence of L1 mild body high loss, but no evidence of fracture, CT abdomen pelvis with no evidence of fracture as well. - Pain control with Norco and tylenol, may increase dosage or change to IV medication if not controlled with this therapy - Ending PT/OT evaluation - Xray of right tib/fib given reported pain on my exam  Hypokalemia - Repleted  CVA (cerebral infarction) - History of, she reports no change in neuro symptoms and she has equal strength in upper extremities and decreased motion of Right lower extremity due to pain   HTN (hypertension) - BP well controlled, continue home medications of amlodipine and losartan at this time   Hyperlipidemia - Continue home simvastatin   Atrial fibrillation - She is not on any controlling medications, but HR is controlled - Continue Eliquis, eyes any history of falls in the past - No sign of ICH on CT head.   Elevated TSH - We'll check free T4, possible sick euthyroid syndrome versus subclinical hypothyroidism.  Code Status:  Full  Family Communication:  Son at bedside  Disposition Plan:  Pending PT  evaluation   Procedures  None   Consults   None   Medications  Scheduled Meds: . amLODipine  10 mg Oral Daily  . apixaban  5 mg Oral BID  . calcium carbonate  1,250 mg Oral Q breakfast  . docusate sodium  100 mg Oral BID  . losartan  100 mg Oral Daily  . mirtazapine  30 mg Oral QHS  . simvastatin  40 mg Oral Daily  . sodium chloride  3 mL Intravenous Q12H   Continuous Infusions:  PRN Meds:.acetaminophen **OR** acetaminophen, HYDROcodone-acetaminophen, ondansetron **OR** ondansetron (ZOFRAN) IV  DVT Prophylaxis  Eliquis  Lab Results  Component Value Date   PLT 240 12/09/2014    Antibiotics    Anti-infectives    None          Objective:   Filed Vitals:   12/09/14 0557 12/09/14 0648 12/09/14 0913 12/09/14 0917  BP: 118/62  113/64 113/64  Pulse: 69   69  Temp:    98.4 F (36.9 C)  TempSrc:    Oral  Resp:      Height:  5\' 2"  (1.575 m)    Weight:  59.875 kg (132 lb)    SpO2:    98%    Wt Readings from Last 3 Encounters:  12/09/14 59.875 kg (132 lb)  03/13/14 59.966 kg (132 lb 3.2 oz)  10/09/13 57.153 kg (126 lb)     Intake/Output Summary (Last 24 hours) at 12/09/14 1241 Last data filed at 12/09/14 1049  Gross per 24 hour  Intake    240 ml  Output      0 ml  Net    240 ml     Physical Exam  Awake Alert, Oriented X 3,  .AT,PERRAL Supple Neck,No JVD, No cervical lymphadenopathy appriciated.  Symmetrical Chest wall movement, Good air movement bilaterally, CTAB RRR,No Gallops,Rubs or new Murmurs, No Parasternal Heave +ve B.Sounds, Abd Soft, No tenderness, No organomegaly appriciated, No rebound - guarding or rigidity. No Cyanosis, Clubbing or edema, No new Rash or bruise     Data Review   Micro Results No results found for this or any previous visit (from the past 240 hour(s)).  Radiology Reports Dg Lumbar Spine Complete  12/08/2014  CLINICAL DATA:  Back pain for 1 day after a fall.  History stroke. EXAM: LUMBAR SPINE - COMPLETE 4+  VIEW COMPARISON:  03/17/2011 and CT of 09/21/2010 FINDINGS: Five lumbar type vertebral bodies. Convex left lumbar spine curvature. Sacroiliac joints are symmetric. Osteopenia. Mild L1 vertebral body height loss is new since the prior. No vertebral canal encroachment. Aortic atherosclerosis with dilatation. 3.7 cm. Loss of intervertebral disc height at the lumbosacral junction. Lower lumbar facet arthropathy. IMPRESSION: Mild L1 vertebral body height loss, new since 09/21/2010. Apparent lower thoracic vertebral body height loss could be due to obliquity on this lumbar dedicated exam. If there are lower thoracic symptoms, consider dedicated radiographs. Atherosclerosis with probable aortic ectasia. Consider further evaluation with ultrasound or CT. Electronically Signed   By: Abigail Miyamoto M.D.   On: 12/08/2014 18:04   Dg Pelvis 1-2 Views  12/08/2014  CLINICAL DATA:  81 year old female with low back pain for 1 day. History of recent fall onto hard flooring. EXAM: PELVIS - 1-2 VIEW COMPARISON:  No priors. FINDINGS: There is no evidence of pelvic fracture or diastasis. No pelvic bone lesions are seen. IMPRESSION: Negative. Electronically Signed   By: Vinnie Langton M.D.   On: 12/08/2014 18:01   Ct Head Wo Contrast  12/08/2014  CLINICAL DATA:  Fall in kitchen today. Weakness. Previous stroke. Initial encounter. EXAM: CT HEAD WITHOUT CONTRAST CT CERVICAL SPINE WITHOUT CONTRAST TECHNIQUE: Multidetector CT imaging of the head and cervical spine was performed following the standard protocol without intravenous contrast. Multiplanar CT image reconstructions of the cervical spine were also generated. COMPARISON:  Head CT on 10/19/2012 FINDINGS: CT HEAD FINDINGS There is no evidence of intracranial hemorrhage, brain edema, or other signs of acute infarction. There is no evidence of intracranial mass lesion or mass effect. No abnormal extraaxial fluid collections are identified. Mild cerebral atrophy and chronic small  vessel disease again noted. Multiple old lacunar infarcts are seen involving the left basal ganglia and pons. No skull fracture identified. CT CERVICAL SPINE FINDINGS No evidence of acute fracture, subluxation, or prevertebral soft tissue swelling. Mild to moderate degenerative disc disease at all levels of C2-3, C3-4, and C4-5. Mild facet DJD seen bilaterally at C4-5 and C5-6. IMPRESSION: No acute intracranial abnormality identified. Cerebral atrophy, chronic small vessel disease, and multiple old lacunar infarcts. No evidence of acute cervical spine fracture or subluxation. Degenerative spondylosis, as described above. Electronically Signed   By: Earle Gell M.D.   On: 12/08/2014 18:21   Ct Chest W Contrast  12/09/2014  CLINICAL DATA:  Fall tonight. Low thoracic and upper  abdominal pain. Right-sided deficit due to old stroke. EXAM: CT CHEST, ABDOMEN, AND PELVIS WITH CONTRAST TECHNIQUE: Multidetector CT imaging of the chest, abdomen and pelvis was performed following the standard protocol during bolus administration of intravenous contrast. CONTRAST:  151mL OMNIPAQUE IOHEXOL 300 MG/ML  SOLN COMPARISON:  None. FINDINGS: CT CHEST FINDINGS Mediastinum/Nodes: Normal heart size. Normal caliber thoracic aorta. Calcification in the aorta and coronary arteries. Eccentric atherosclerotic wall thickening in the aortic arch. No aortic dissection. Great vessel origins are patent. No mediastinal lymphadenopathy. No abnormal mediastinal gas or fluid collections. Lungs/Pleura: Atelectasis in the lung bases. Focal patchy infiltration or atelectasis in the left mid lung. No definite evidence of consolidation or contusion. Airways are patent. No pleural effusions. No pneumothorax. Musculoskeletal: Normal alignment of the thoracic spine. Posterior elements appear intact. No vertebral compression deformities. Degenerative changes throughout the spine. No acute displaced rib fractures a demonstrated. Sternum is nondepressed.  Visualized portions of the shoulders and clavicles appear intact. CT ABDOMEN PELVIS FINDINGS Hepatobiliary: Sub cm low-attenuation lesion consistent with small cysts. Gallbladder and bile ducts are normal. Pancreas: Normal. Spleen: Normal. Adrenals/Urinary Tract: No adrenal gland nodules. Sub cm low-attenuation lesions in the kidneys likely representing cysts. Small parapelvic cyst on the left. No hydronephrosis or hydroureter. Renal nephrograms are symmetrical. Bladder wall is not thickened and no filling defects are demonstrated. Stomach/Bowel: Stomach, small bowel, and colon are not abnormally distended. Anterior abdominal wall hernia containing a portion of the transverse colon without proximal obstruction. Vascular/Lymphatic: Calcification and atherosclerotic change in the abdominal aorta. Infrarenal abdominal aortic aneurysm measuring 3.1 cm AP diameter. No aortic dissection IVC is unremarkable. No retroperitoneal lymphadenopathy. Reproductive: Uterus and ovaries are not enlarged. No pelvic mass or lymphadenopathy. Other: No free air or free fluid in the abdomen. No free or loculated pelvic fluid collections. Musculoskeletal: Lumbar scoliosis and degenerative changes. No vertebral compression deformities. Posterior elements appear intact. Sacrum, pelvis, and hips appear intact. IMPRESSION: No acute posttraumatic changes demonstrated in the chest, abdomen, or pelvis. Atelectasis in the lung bases. Atherosclerotic changes throughout the thoracoabdominal aorta. Small abdominal aortic aneurysm measuring 3.1 cm diameter. Ventral abdominal wall hernia containing fat and a portion of the transverse colon. No proximal obstruction. Electronically Signed   By: Lucienne Capers M.D.   On: 12/09/2014 00:10   Ct Cervical Spine Wo Contrast  12/08/2014  CLINICAL DATA:  Fall in kitchen today. Weakness. Previous stroke. Initial encounter. EXAM: CT HEAD WITHOUT CONTRAST CT CERVICAL SPINE WITHOUT CONTRAST TECHNIQUE:  Multidetector CT imaging of the head and cervical spine was performed following the standard protocol without intravenous contrast. Multiplanar CT image reconstructions of the cervical spine were also generated. COMPARISON:  Head CT on 10/19/2012 FINDINGS: CT HEAD FINDINGS There is no evidence of intracranial hemorrhage, brain edema, or other signs of acute infarction. There is no evidence of intracranial mass lesion or mass effect. No abnormal extraaxial fluid collections are identified. Mild cerebral atrophy and chronic small vessel disease again noted. Multiple old lacunar infarcts are seen involving the left basal ganglia and pons. No skull fracture identified. CT CERVICAL SPINE FINDINGS No evidence of acute fracture, subluxation, or prevertebral soft tissue swelling. Mild to moderate degenerative disc disease at all levels of C2-3, C3-4, and C4-5. Mild facet DJD seen bilaterally at C4-5 and C5-6. IMPRESSION: No acute intracranial abnormality identified. Cerebral atrophy, chronic small vessel disease, and multiple old lacunar infarcts. No evidence of acute cervical spine fracture or subluxation. Degenerative spondylosis, as described above. Electronically Signed   By: Jenny Reichmann  Kris Hartmann M.D.   On: 12/08/2014 18:21   Ct Abdomen Pelvis W Contrast  12/09/2014  CLINICAL DATA:  Fall tonight. Low thoracic and upper abdominal pain. Right-sided deficit due to old stroke. EXAM: CT CHEST, ABDOMEN, AND PELVIS WITH CONTRAST TECHNIQUE: Multidetector CT imaging of the chest, abdomen and pelvis was performed following the standard protocol during bolus administration of intravenous contrast. CONTRAST:  160mL OMNIPAQUE IOHEXOL 300 MG/ML  SOLN COMPARISON:  None. FINDINGS: CT CHEST FINDINGS Mediastinum/Nodes: Normal heart size. Normal caliber thoracic aorta. Calcification in the aorta and coronary arteries. Eccentric atherosclerotic wall thickening in the aortic arch. No aortic dissection. Great vessel origins are patent. No  mediastinal lymphadenopathy. No abnormal mediastinal gas or fluid collections. Lungs/Pleura: Atelectasis in the lung bases. Focal patchy infiltration or atelectasis in the left mid lung. No definite evidence of consolidation or contusion. Airways are patent. No pleural effusions. No pneumothorax. Musculoskeletal: Normal alignment of the thoracic spine. Posterior elements appear intact. No vertebral compression deformities. Degenerative changes throughout the spine. No acute displaced rib fractures a demonstrated. Sternum is nondepressed. Visualized portions of the shoulders and clavicles appear intact. CT ABDOMEN PELVIS FINDINGS Hepatobiliary: Sub cm low-attenuation lesion consistent with small cysts. Gallbladder and bile ducts are normal. Pancreas: Normal. Spleen: Normal. Adrenals/Urinary Tract: No adrenal gland nodules. Sub cm low-attenuation lesions in the kidneys likely representing cysts. Small parapelvic cyst on the left. No hydronephrosis or hydroureter. Renal nephrograms are symmetrical. Bladder wall is not thickened and no filling defects are demonstrated. Stomach/Bowel: Stomach, small bowel, and colon are not abnormally distended. Anterior abdominal wall hernia containing a portion of the transverse colon without proximal obstruction. Vascular/Lymphatic: Calcification and atherosclerotic change in the abdominal aorta. Infrarenal abdominal aortic aneurysm measuring 3.1 cm AP diameter. No aortic dissection IVC is unremarkable. No retroperitoneal lymphadenopathy. Reproductive: Uterus and ovaries are not enlarged. No pelvic mass or lymphadenopathy. Other: No free air or free fluid in the abdomen. No free or loculated pelvic fluid collections. Musculoskeletal: Lumbar scoliosis and degenerative changes. No vertebral compression deformities. Posterior elements appear intact. Sacrum, pelvis, and hips appear intact. IMPRESSION: No acute posttraumatic changes demonstrated in the chest, abdomen, or pelvis.  Atelectasis in the lung bases. Atherosclerotic changes throughout the thoracoabdominal aorta. Small abdominal aortic aneurysm measuring 3.1 cm diameter. Ventral abdominal wall hernia containing fat and a portion of the transverse colon. No proximal obstruction. Electronically Signed   By: Lucienne Capers M.D.   On: 12/09/2014 00:10   Dg Tibia/fibula Right Port  12/09/2014  CLINICAL DATA:  Pt states she fell 1 week ago in her kitchen and has been having distal right lower leg pain since; verified with RN Vickie that right tib/fib was to be done b/c order notes stated left; per notes in pt chart, the right leg was to be done. Initial encounter. EXAM: PORTABLE RIGHT TIBIA AND FIBULA - 2 VIEW COMPARISON:  None. FINDINGS: Mild joint space narrowing involves the medial compartment of the knee. No acute fracture or dislocation. IMPRESSION: No acute osseous abnormality. Electronically Signed   By: Abigail Miyamoto M.D.   On: 12/09/2014 09:15     CBC  Recent Labs Lab 12/08/14 2026 12/09/14 0506  WBC 10.5 7.1  HGB 11.8* 10.9*  HCT 35.5* 33.0*  PLT 258 240  MCV 87.9 87.5  MCH 29.2 28.9  MCHC 33.2 33.0  RDW 13.1 13.2  LYMPHSABS 1.8  --   MONOABS 0.5  --   EOSABS 0.0  --   BASOSABS 0.0  --  Chemistries   Recent Labs Lab 12/08/14 2026 12/09/14 0506  NA 136 140  K 3.2* 3.7  CL 102 105  CO2 24 26  GLUCOSE 116* 103*  BUN 12 9  CREATININE 0.72 0.75  CALCIUM 8.7* 8.5*  AST 19  --   ALT 13*  --   ALKPHOS 54  --   BILITOT 0.6  --    ------------------------------------------------------------------------------------------------------------------ estimated creatinine clearance is 45.1 mL/min (by C-G formula based on Cr of 0.75). ------------------------------------------------------------------------------------------------------------------ No results for input(s): HGBA1C in the last 72  hours. ------------------------------------------------------------------------------------------------------------------ No results for input(s): CHOL, HDL, LDLCALC, TRIG, CHOLHDL, LDLDIRECT in the last 72 hours. ------------------------------------------------------------------------------------------------------------------  Recent Labs  12/09/14 0506  TSH 7.841*   ------------------------------------------------------------------------------------------------------------------ No results for input(s): VITAMINB12, FOLATE, FERRITIN, TIBC, IRON, RETICCTPCT in the last 72 hours.  Coagulation profile No results for input(s): INR, PROTIME in the last 168 hours.  No results for input(s): DDIMER in the last 72 hours.  Cardiac Enzymes No results for input(s): CKMB, TROPONINI, MYOGLOBIN in the last 168 hours.  Invalid input(s): CK ------------------------------------------------------------------------------------------------------------------ Invalid input(s): POCBNP     Time Spent in minutes   30 mnutes   Storm Sovine M.D on 12/09/2014 at 12:41 PM  Between 7am to 7pm - Pager - [phone]  After 7pm go to [email or web address] - password [facility]  [facility]   Office  [phone]

## 2014-12-10 DIAGNOSIS — I4891 Unspecified atrial fibrillation: Secondary | ICD-10-CM

## 2014-12-10 DIAGNOSIS — W19XXXA Unspecified fall, initial encounter: Secondary | ICD-10-CM

## 2014-12-10 DIAGNOSIS — I10 Essential (primary) hypertension: Secondary | ICD-10-CM

## 2014-12-10 DIAGNOSIS — S32019A Unspecified fracture of first lumbar vertebra, initial encounter for closed fracture: Secondary | ICD-10-CM | POA: Diagnosis not present

## 2014-12-10 DIAGNOSIS — E785 Hyperlipidemia, unspecified: Secondary | ICD-10-CM

## 2014-12-10 DIAGNOSIS — W19XXXD Unspecified fall, subsequent encounter: Secondary | ICD-10-CM

## 2014-12-10 LAB — GLUCOSE, CAPILLARY: GLUCOSE-CAPILLARY: 89 mg/dL (ref 65–99)

## 2014-12-10 MED ORDER — HYDROCODONE-ACETAMINOPHEN 5-325 MG PO TABS: 1.0000 | ORAL_TABLET | Freq: Four times a day (QID) | ORAL | Status: AC | PRN

## 2014-12-10 NOTE — Care Management Note (Signed)
Case Management Note  Patient Details  Name: Jane Farley MRN: [id number] Date of Birth: 1932-04-26  Subjective/Objective:     Date: 12/10/14 Spoke with patient at the bedside along with son, Steve  [phone]. Introduced self as Tourist information centre manager and explained role in discharge planning and how to be reached. Verified patient lives in town, alone , has DME rolling walker. Expressed no  potential need for no other DME. Verified patient anticipates to go home alone,  at time of discharge and will have  part-time supervision by Richardson Landry her son at this time to best of their knowledge. Patient  denied needing help with their medication. Patient  is driven by family to MD appointments. Verified patient has PCP [place]. Patient chose [facility] for [facility], PT, [place], aide and Education officer, museum.  Referral made to [facility] with [facility].  Soc will begin 24-48 hrs post dc.   Plan: CM will continue to follow for discharge planning and [facility] resources.                Action/Plan:   Expected Discharge Date:                  Expected Discharge Plan:  Skilled Nursing Facility  In-House Referral:  Clinical Social Work  Discharge planning Services  CM Consult  Post Acute Care Choice:  Home Health Choice offered to:  Patient  DME Arranged:    DME Agency:     HH Arranged:  RN, PT, OT, Nurse's Aide, Social Work [employer] Agency:  [place]  Status of Service:  Completed, signed off  Medicare Important Message Given:    Date Medicare IM Given:    Medicare IM give by:    Date Additional Medicare IM Given:    Additional Medicare Important Message give by:     If discussed at [place] of Stay Meetings, dates discussed:    Additional Comments:  Zenon Mayo, RN 12/10/2014, 12:10 PM

## 2014-12-10 NOTE — Discharge Instructions (Signed)
Follow with Primary MD Haywood Pao, MD in 7 days   Get CBC, CMP, 2 view Chest X ray checked  by Primary MD next visit.    Activity: As tolerated with Full fall precautions use walker/cane & assistance as needed   Disposition Home    Diet: Heart Healthy  , with feeding assistance and aspiration precautions.  For Heart failure patients - Check your Weight same time everyday, if you gain over 2 pounds, or you develop in leg swelling, experience more shortness of breath or chest pain, call your Primary MD immediately. Follow Cardiac Low Salt Diet and 1.5 lit/day fluid restriction.   On your next visit with your primary care physician please Get Medicines reviewed and adjusted.   Please request your Prim.MD to go over all Hospital Tests and Procedure/Radiological results at the follow up, please get all Hospital records sent to your Prim MD by signing hospital release before you go home.   If you experience worsening of your admission symptoms, develop shortness of breath, life threatening emergency, suicidal or homicidal thoughts you must seek medical attention immediately by calling 911 or calling your MD immediately  if symptoms less severe.  You Must read complete instructions/literature along with all the possible adverse reactions/side effects for all the Medicines you take and that have been prescribed to you. Take any new Medicines after you have completely understood and accpet all the possible adverse reactions/side effects.   Do not drive, operating heavy machinery, perform activities at heights, swimming or participation in water activities or provide baby sitting services if your were admitted for syncope or siezures until you have seen by Primary MD or a Neurologist and advised to do so again.  Do not drive when taking Pain medications.    Do not take more than prescribed Pain, Sleep and Anxiety Medications  Special Instructions: If you have smoked or chewed Tobacco   in the last 2 yrs please stop smoking, stop any regular Alcohol  and or any Recreational drug use.  Wear Seat belts while driving.   Please note  You were cared for by a hospitalist during your hospital stay. If you have any questions about your discharge medications or the care you received while you were in the hospital after you are discharged, you can call the unit and asked to speak with the hospitalist on call if the hospitalist that took care of you is not available. Once you are discharged, your primary care physician will handle any further medical issues. Please note that NO REFILLS for any discharge medications will be authorized once you are discharged, as it is imperative that you return to your primary care physician (or establish a relationship with a primary care physician if you do not have one) for your aftercare needs so that they can reassess your need for medications and monitor your lab values.  -------------------------------------------------------------------------------  Information on my medicine - ELIQUIS (apixaban)  This medication education was reviewed with me or my healthcare representative as part of my discharge preparation.  The pharmacist that spoke with me during my hospital stay was:  Arty Baumgartner, [facility]  Why was Eliquis prescribed for you? Eliquis was prescribed for you to reduce the risk of a blood clot forming that can cause a stroke if you have a medical condition called atrial fibrillation (a type of irregular heartbeat).  What do You need to know about Eliquis ? Take your Eliquis TWICE DAILY - one tablet in the morning and one  tablet in the evening with or without food. If you have difficulty swallowing the tablet whole please discuss with your pharmacist how to take the medication safely.  Take Eliquis exactly as prescribed by your doctor and DO NOT stop taking Eliquis without talking to the doctor who prescribed the medication.   Stopping may increase your risk of developing a stroke.  Refill your prescription before you run out.  After discharge, you should have regular check-up appointments with your healthcare provider that is prescribing your Eliquis.  In the future your dose may need to be changed if your kidney function or weight changes by a significant amount or as you get older.  What do you do if you miss a dose? If you miss a dose, take it as soon as you remember on the same day and resume taking twice daily.  Do not take more than one dose of ELIQUIS at the same time to make up a missed dose.  Important Safety Information A possible side effect of Eliquis is bleeding. You should call your healthcare provider right away if you experience any of the following: ? Bleeding from an injury or your nose that does not stop. ? Unusual colored urine (red or dark brown) or unusual colored stools (red or black). ? Unusual bruising for unknown reasons. ? A serious fall or if you hit your head (even if there is no bleeding).  Some medicines may interact with Eliquis and might increase your risk of bleeding or clotting while on Eliquis. To help avoid this, consult your healthcare provider or pharmacist prior to using any new prescription or non-prescription medications, including herbals, vitamins, non-steroidal anti-inflammatory drugs (NSAIDs) and supplements.  This website has more information on Eliquis (apixaban): http://www.eliquis.com/eliquis/home

## 2014-12-10 NOTE — Evaluation (Signed)
Occupational Therapy Evaluation Patient Details Name: Jane Farley MRN: [id number] DOB: [birth year] Today's Date: 12/10/2014    History of Present Illness 81yo woman with PMH of HLD, HTN, osteoporosis, CVA with residual weakness and PAF on eliquis who presents after a fall. Ms. Strollo reports that she was walking with her walker to the kitchen when she caught one foot on her other shoe and fell backwards. She bumped her head on the refrigerator. She did lose consciousness and reports no syncope. When she presented to the ED she reported severe pain in her back and inability to walk. Imaging shows - Lumbosacral spine with evidence of L1 mild body high loss, but no evidence of fracture, CT abdomen pelvis with no evidence of fracture as well.Jane Farley tib/fib (-) for fracture.   Clinical Impression   Pt up to [facility] and then pivoted to chair also. Pain reported as 9/10 with mobility but states it is better once still again. She did have some nausea with up to [facility] and vomited small amount of clear liquid. Pt will benefit from skilled OT to progress ADL independence for next venue. Feel she will need SNF at d/c but if home, recommend 24/7 assist and [place].    Follow Up Recommendations  SNF;Supervision/Assistance - 24 hour    Equipment Recommendations  3 in 1 bedside comode    Recommendations for Other Services       Precautions / Restrictions Precautions Precautions: Fall;Back Restrictions Weight Bearing Restrictions: No      Mobility Bed Mobility Overal bed mobility: Needs Assistance Bed Mobility: Rolling;Sidelying to Sit Rolling: Mod assist Sidelying to sit: Mod assist       General bed mobility comments: assist to initiate rolling and bring LEs over to EOB. Difficulty with trunk to upright. Cues for hand placement and technique for log roll.  Transfers Overall transfer level: Needs assistance Equipment used: None   Sit to Stand: Mod assist         General transfer  comment: Able to tolerate pivot to chair -cues for hand placement and took small steps to pivot around to [facility] and recliner. Limited use of R UE to help with transfers.    Balance Overall balance assessment: Needs assistance   Sitting balance-Leahy Scale: Fair       Standing balance-Leahy Scale: Poor                              ADL Overall ADL's : Needs assistance/impaired Eating/Feeding: Set up;Sitting   Grooming: Wash/dry face;Set up;Sitting   Upper Body Bathing: Minimal assitance;Sitting   Lower Body Bathing: Maximal assistance;Sit to/from stand   Upper Body Dressing : Moderate assistance;Sitting   Lower Body Dressing: Total assistance;Sit to/from stand   Toilet Transfer: Moderate assistance;Stand-pivot;BSC   Toileting- Clothing Manipulation and Hygiene: Maximal assistance;Sit to/from stand         General ADL Comments: Pt with some nausea with pivot to [facility] but subsided within short duration. Pt reports pain 9/10 but states after she stops moving, pain is much improved. Pt pleased to get up to chair today.      Vision     Perception     Praxis      Pertinent Vitals/Pain Pain Assessment: 0-10 Pain Score: 9  Pain Location: back Pain Descriptors / Indicators: Grimacing;Discomfort Pain Intervention(s): Monitored during session;Patient requesting pain meds-RN notified     Hand Dominance Right (uses L hand to feed due to residual  weakness R UE )   Extremity/Trunk Assessment Upper Extremity Assessment Upper Extremity Assessment: RUE deficits/detail RUE Deficits / Details: residual weakness from stroke. very little shoudler AROM ~ 15 degrees. Slight Elbow contracture noted. 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 Limited AROM in wrist noted also. Limited grip R hand.            Communication Communication Communication: No difficulties   Cognition Arousal/Alertness: Awake/alert Behavior During Therapy: WFL for tasks assessed/performed Overall  Cognitive Status: Within Functional Limits for tasks assessed                     General Comments       Exercises       Shoulder Instructions      Home Living Family/patient expects to be discharged to:: Private residence Living Arrangements: Alone Available Help at Discharge: Family;Available PRN/intermittently Type of Home: House                           Additional Comments: son grocery shops, pt claims to perform all other ADLs      Prior Functioning/Environment Level of Independence: Independent with assistive device(s)        Comments: RW    OT Diagnosis: Generalized weakness;Acute pain   OT Problem List: Decreased strength;Decreased knowledge of use of DME or AE;Decreased knowledge of precautions;Pain   OT Treatment/Interventions: Self-care/ADL training;Patient/family education;Therapeutic activities;DME and/or AE instruction    OT Goals(Current goals can be found in the care plan section) Acute Rehab OT Goals Patient Stated Goal: wants to do for self again OT Goal Formulation: With patient Time For Goal Achievement: 12/24/14 Potential to Achieve Goals: Good  OT Frequency: Min 2X/week   Barriers to D/C:            Co-evaluation              End of Session    Activity Tolerance: Patient limited by pain Patient left: in chair;with call bell/phone within reach;with chair alarm set   Time: 5643-3295 OT Time Calculation (min): 29 min Charges:  OT General Charges $OT Visit: 1 Procedure OT Evaluation $Initial OT Evaluation Tier I: 1 Procedure OT Treatments $Therapeutic Activity: 8-22 mins G-Codes: OT G-codes **NOT FOR INPATIENT CLASS** Functional Assessment Tool Used: clinical judgement Functional Limitation: Self care Self Care Current Status (J8841): At least 60 percent but less than 80 percent impaired, limited or restricted Self Care Goal Status (Y6063): At least 1 percent but less than 20 percent impaired, limited or  restricted  Jane Farley  [phone] 12/10/2014, 9:44 AM

## 2014-12-10 NOTE — Discharge Summary (Signed)
Jane Farley, is a 81 y.o. female  DOB February 22, 1933  MRN [id number].  Admission date:  12/08/2014  Admitting Physician  Sid Falcon, MD  Discharge Date:  12/10/2014   Primary MD  Haywood Pao, MD  Recommendations for primary care physician for things to follow:  - Check basic labs including CBC, BMP during next visit. - Patient will need repeat TSH and free T4 in 6 weeks   Admission Diagnosis  Neurologic gait dysfunction [R26.9] Fall [W19.XXXA] Intractable pain [R52] Fall, initial encounter [W19.XXXA] Compression fracture of L1 lumbar vertebra, closed, initial encounter [facility]) [G90.211D]   Discharge Diagnosis  Neurologic gait dysfunction [R26.9] Fall [W19.XXXA] Intractable pain [R52] Fall, initial encounter [W19.XXXA] Compression fracture of L1 lumbar vertebra, closed, initial encounter ([place]) [S32.010A]    Principal Problem:   Fall Active Problems:   CVA (cerebral infarction)   HTN (hypertension)   Hyperlipidemia   Atrial fibrillation ([place])   Fracture of lumbar spine [facility])      Past Medical History  Diagnosis Date  . Hemorrhoids   . Nephrolithiasis   . Hypercholesterolemia   . Hypertension   . Osteoporosis   . Stroke ([place])   . Weakness due to cerebrovascular accident     Residual RT sided weakness from previous CVA    Past Surgical History  Procedure Laterality Date  . Breast lumpectomy  02/23/1965    left  . Exploratory laparotomy with abdominal mass excision  12/08/2010  . Tee without cardioversion N/A 11/25/2012    Procedure: TRANSESOPHAGEAL ECHOCARDIOGRAM (TEE);  Surgeon: Thayer Headings, MD;  Location: [place];  Service: Cardiovascular;  Laterality: N/A;  . Loop recorder implant N/A 11/25/2012    Procedure: LOOP RECORDER IMPLANT;  Surgeon: Evans Lance, MD;  Location: [facility] CATH LAB;  Service: Cardiovascular;  Laterality: N/A;       History of present  illness and  Hospital Course:     Kindly see H&P for history of present illness and admission details, please review complete Labs, Consult reports and Test reports for all details in brief  HPI  from the history and physical done on the day of admission Jane Farley is a 81yo woman with PMH of HLD, HTN, osteoporosis, CVA with residual weakness and PAF on eliquis who presents after a fall. Jane Farley reports that she was walking with her walker to the kitchen when she caught one foot on her other shoe and fell backwards. She bumped her head on the refrigerator. She did lose consciousness and reports no syncope. When she presented to the ED she reported severe pain in her back and inability to walk, which she confirmed with me. She reports being in her normal state of health and having no symptoms prior to this happening. She only has back pain. She specifically denies lightheadedness, dizziness, blacking out, seizure like activity, headache, chest pain, SOB, nausea, vomiting, diarrhea, slurred speech, worsening weakness, room spinning, change in hearing or tinnitus.   She had a work up in the ED which included a  lumbar spine and pelvis xray which showed a new L1 compression fracture (history of osteoporosis), CT head which showed atrophy and old CVAs, CT chest/abdomen/pelvis with no posttraumatic changes, CT cervical spine with no acute changes. She is on Eliquis for her Afib, but no source of bleeding or hematoma was identified. She was found to have a low K. When I talked to her, Jane Farley was mainly complaining of back and right leg pain.    Hospital Course   Fall with lower back pain - Lumbosacral spine with evidence of L1 mild body high loss, but no evidence of fracture, CT abdomen pelvis with no evidence of fracture as well. - Seen by PT / OT, the patient have home care including PT/OT arranged at home - Continue with Norco as needed for pain  Hypokalemia - Repleted  CVA (cerebral  infarction) - History of, she reports no change in neuro symptoms and she has equal strength in upper extremities and decreased motion of Right lower extremity due to pain   HTN (hypertension) - BP well controlled, continue home medications of amlodipine and losartan at this time   Hyperlipidemia - Continue home simvastatin   Atrial fibrillation - She is not on any controlling medications, but HR is controlled - Continue Eliquis, denies any history of falls in the past - No sign of ICH on CT head.   Elevated TSH - TSH is elevated, free T4 within normal limit, this is possibly 6 euthyroid syndrome, will need repeat labs in 6 weeks.     Discharge Condition:  Stable   Follow UP  Follow-up Information    Follow up with [facility].   Why:  HHRN, PT,OT, aide and Social worker   Contact information:   [address] [place] [address] [phone]       Follow up with Haywood Pao, MD. Call in 1 week.   Specialty:  Internal Medicine   Why:  Posthospitalization follow-up   Contact information:   [address] [place] [address] [phone]         Discharge Instructions  and  Discharge Medications     Discharge Instructions    Discharge instructions    Complete by:  As directed   Follow with Primary MD Haywood Pao, MD in 7 days   Get CBC, CMP, 2 view Chest X ray checked  by Primary MD next visit.    Activity: As tolerated with Full fall precautions use walker/cane & assistance as needed   Disposition Home **   Diet: Heart Healthy ** , with feeding assistance and aspiration precautions.  For Heart failure patients - Check your Weight same time everyday, if you gain over 2 pounds, or you develop in leg swelling, experience more shortness of breath or chest pain, call your Primary MD immediately. Follow Cardiac Low Salt Diet and 1.5 lit/day fluid restriction.   On your next visit with your primary care  physician please Get Medicines reviewed and adjusted.   Please request your Prim.MD to go over all Hospital Tests and Procedure/Radiological results at the follow up, please get all Hospital records sent to your Prim MD by signing hospital release before you go home.   If you experience worsening of your admission symptoms, develop shortness of breath, life threatening emergency, suicidal or homicidal thoughts you must seek medical attention immediately by calling 911 or calling your MD immediately  if symptoms less severe.  You Must read complete instructions/literature along with all the  possible adverse reactions/side effects for all the Medicines you take and that have been prescribed to you. Take any new Medicines after you have completely understood and accpet all the possible adverse reactions/side effects.   Do not drive, operating heavy machinery, perform activities at heights, swimming or participation in water activities or provide baby sitting services if your were admitted for syncope or siezures until you have seen by Primary MD or a Neurologist and advised to do so again.  Do not drive when taking Pain medications.    Do not take more than prescribed Pain, Sleep and Anxiety Medications  Special Instructions: If you have smoked or chewed Tobacco  in the last 2 yrs please stop smoking, stop any regular Alcohol  and or any Recreational drug use.  Wear Seat belts while driving.   Please note  You were cared for by a hospitalist during your hospital stay. If you have any questions about your discharge medications or the care you received while you were in the hospital after you are discharged, you can call the unit and asked to speak with the hospitalist on call if the hospitalist that took care of you is not available. Once you are discharged, your primary care physician will handle any further medical issues. Please note that NO REFILLS for any discharge medications will be  authorized once you are discharged, as it is imperative that you return to your primary care physician (or establish a relationship with a primary care physician if you do not have one) for your aftercare needs so that they can reassess your need for medications and monitor your lab values.            Medication List    TAKE these medications        amLODipine 10 MG tablet  Commonly known as:  NORVASC  Take 10 mg by mouth daily.     calcium carbonate 600 MG Tabs tablet  Commonly known as:  OS-CAL  Take 600 mg by mouth daily.     ELIQUIS 5 MG Tabs tablet  Generic drug:  apixaban  take 1 tablet by mouth twice a day     HYDROcodone-acetaminophen 5-325 MG tablet  Commonly known as:  NORCO/VICODIN  Take 1 tablet by mouth every 6 (six) hours as needed for severe pain.     losartan 100 MG tablet  Commonly known as:  COZAAR  Take 100 mg by mouth daily.     mirtazapine 30 MG tablet  Commonly known as:  REMERON  Take 30 mg by mouth at bedtime.     RECLAST IV  Inject into the vein. Once a year     simvastatin 40 MG tablet  Commonly known as:  ZOCOR  Take 40 mg by mouth daily.          Diet and Activity recommendation: See Discharge Instructions above   Consults obtained -  None   Major procedures and Radiology Reports - PLEASE review detailed and final reports for all details, in brief -      Dg Lumbar Spine Complete  12/08/2014  CLINICAL DATA:  Back pain for 1 day after a fall.  History stroke. EXAM: LUMBAR SPINE - COMPLETE 4+ VIEW COMPARISON:  03/17/2011 and CT of 09/21/2010 FINDINGS: Five lumbar type vertebral bodies. Convex left lumbar spine curvature. Sacroiliac joints are symmetric. Osteopenia. Mild L1 vertebral body height loss is new since the prior. No vertebral canal encroachment. Aortic atherosclerosis with dilatation. 3.7 cm. Loss of intervertebral  disc height at the lumbosacral junction. Lower lumbar facet arthropathy. IMPRESSION: Mild L1 vertebral body  height loss, new since 09/21/2010. Apparent lower thoracic vertebral body height loss could be due to obliquity on this lumbar dedicated exam. If there are lower thoracic symptoms, consider dedicated radiographs. Atherosclerosis with probable aortic ectasia. Consider further evaluation with ultrasound or CT. Electronically Signed   By: Abigail Miyamoto M.D.   On: 12/08/2014 18:04   Dg Pelvis 1-2 Views  12/08/2014  CLINICAL DATA:  81 year old female with low back pain for 1 day. History of recent fall onto hard flooring. EXAM: PELVIS - 1-2 VIEW COMPARISON:  No priors. FINDINGS: There is no evidence of pelvic fracture or diastasis. No pelvic bone lesions are seen. IMPRESSION: Negative. Electronically Signed   By: Vinnie Langton M.D.   On: 12/08/2014 18:01   Ct Head Wo Contrast  12/08/2014  CLINICAL DATA:  Fall in kitchen today. Weakness. Previous stroke. Initial encounter. EXAM: CT HEAD WITHOUT CONTRAST CT CERVICAL SPINE WITHOUT CONTRAST TECHNIQUE: Multidetector CT imaging of the head and cervical spine was performed following the standard protocol without intravenous contrast. Multiplanar CT image reconstructions of the cervical spine were also generated. COMPARISON:  Head CT on 10/19/2012 FINDINGS: CT HEAD FINDINGS There is no evidence of intracranial hemorrhage, brain edema, or other signs of acute infarction. There is no evidence of intracranial mass lesion or mass effect. No abnormal extraaxial fluid collections are identified. Mild cerebral atrophy and chronic small vessel disease again noted. Multiple old lacunar infarcts are seen involving the left basal ganglia and pons. No skull fracture identified. CT CERVICAL SPINE FINDINGS No evidence of acute fracture, subluxation, or prevertebral soft tissue swelling. Mild to moderate degenerative disc disease at all levels of C2-3, C3-4, and C4-5. Mild facet DJD seen bilaterally at C4-5 and C5-6. IMPRESSION: No acute intracranial abnormality identified. Cerebral  atrophy, chronic small vessel disease, and multiple old lacunar infarcts. No evidence of acute cervical spine fracture or subluxation. Degenerative spondylosis, as described above. Electronically Signed   By: Earle Gell M.D.   On: 12/08/2014 18:21   Ct Chest W Contrast  12/09/2014  CLINICAL DATA:  Fall tonight. Low thoracic and upper abdominal pain. Right-sided deficit due to old stroke. EXAM: CT CHEST, ABDOMEN, AND PELVIS WITH CONTRAST TECHNIQUE: Multidetector CT imaging of the chest, abdomen and pelvis was performed following the standard protocol during bolus administration of intravenous contrast. CONTRAST:  112mL OMNIPAQUE IOHEXOL 300 MG/ML  SOLN COMPARISON:  None. FINDINGS: CT CHEST FINDINGS Mediastinum/Nodes: Normal heart size. Normal caliber thoracic aorta. Calcification in the aorta and coronary arteries. Eccentric atherosclerotic wall thickening in the aortic arch. No aortic dissection. Great vessel origins are patent. No mediastinal lymphadenopathy. No abnormal mediastinal gas or fluid collections. Lungs/Pleura: Atelectasis in the lung bases. Focal patchy infiltration or atelectasis in the left mid lung. No definite evidence of consolidation or contusion. Airways are patent. No pleural effusions. No pneumothorax. Musculoskeletal: Normal alignment of the thoracic spine. Posterior elements appear intact. No vertebral compression deformities. Degenerative changes throughout the spine. No acute displaced rib fractures a demonstrated. Sternum is nondepressed. Visualized portions of the shoulders and clavicles appear intact. CT ABDOMEN PELVIS FINDINGS Hepatobiliary: Sub cm low-attenuation lesion consistent with small cysts. Gallbladder and bile ducts are normal. Pancreas: Normal. Spleen: Normal. Adrenals/Urinary Tract: No adrenal gland nodules. Sub cm low-attenuation lesions in the kidneys likely representing cysts. Small parapelvic cyst on the left. No hydronephrosis or hydroureter. Renal nephrograms are  symmetrical. Bladder wall is not thickened and  no filling defects are demonstrated. Stomach/Bowel: Stomach, small bowel, and colon are not abnormally distended. Anterior abdominal wall hernia containing a portion of the transverse colon without proximal obstruction. Vascular/Lymphatic: Calcification and atherosclerotic change in the abdominal aorta. Infrarenal abdominal aortic aneurysm measuring 3.1 cm AP diameter. No aortic dissection IVC is unremarkable. No retroperitoneal lymphadenopathy. Reproductive: Uterus and ovaries are not enlarged. No pelvic mass or lymphadenopathy. Other: No free air or free fluid in the abdomen. No free or loculated pelvic fluid collections. Musculoskeletal: Lumbar scoliosis and degenerative changes. No vertebral compression deformities. Posterior elements appear intact. Sacrum, pelvis, and hips appear intact. IMPRESSION: No acute posttraumatic changes demonstrated in the chest, abdomen, or pelvis. Atelectasis in the lung bases. Atherosclerotic changes throughout the thoracoabdominal aorta. Small abdominal aortic aneurysm measuring 3.1 cm diameter. Ventral abdominal wall hernia containing fat and a portion of the transverse colon. No proximal obstruction. Electronically Signed   By: Lucienne Capers M.D.   On: 12/09/2014 00:10   Ct Cervical Spine Wo Contrast  12/08/2014  CLINICAL DATA:  Fall in kitchen today. Weakness. Previous stroke. Initial encounter. EXAM: CT HEAD WITHOUT CONTRAST CT CERVICAL SPINE WITHOUT CONTRAST TECHNIQUE: Multidetector CT imaging of the head and cervical spine was performed following the standard protocol without intravenous contrast. Multiplanar CT image reconstructions of the cervical spine were also generated. COMPARISON:  Head CT on 10/19/2012 FINDINGS: CT HEAD FINDINGS There is no evidence of intracranial hemorrhage, brain edema, or other signs of acute infarction. There is no evidence of intracranial mass lesion or mass effect. No abnormal extraaxial  fluid collections are identified. Mild cerebral atrophy and chronic small vessel disease again noted. Multiple old lacunar infarcts are seen involving the left basal ganglia and pons. No skull fracture identified. CT CERVICAL SPINE FINDINGS No evidence of acute fracture, subluxation, or prevertebral soft tissue swelling. Mild to moderate degenerative disc disease at all levels of C2-3, C3-4, and C4-5. Mild facet DJD seen bilaterally at C4-5 and C5-6. IMPRESSION: No acute intracranial abnormality identified. Cerebral atrophy, chronic small vessel disease, and multiple old lacunar infarcts. No evidence of acute cervical spine fracture or subluxation. Degenerative spondylosis, as described above. Electronically Signed   By: Earle Gell M.D.   On: 12/08/2014 18:21   Ct Abdomen Pelvis W Contrast  12/09/2014  CLINICAL DATA:  Fall tonight. Low thoracic and upper abdominal pain. Right-sided deficit due to old stroke. EXAM: CT CHEST, ABDOMEN, AND PELVIS WITH CONTRAST TECHNIQUE: Multidetector CT imaging of the chest, abdomen and pelvis was performed following the standard protocol during bolus administration of intravenous contrast. CONTRAST:  171mL OMNIPAQUE IOHEXOL 300 MG/ML  SOLN COMPARISON:  None. FINDINGS: CT CHEST FINDINGS Mediastinum/Nodes: Normal heart size. Normal caliber thoracic aorta. Calcification in the aorta and coronary arteries. Eccentric atherosclerotic wall thickening in the aortic arch. No aortic dissection. Great vessel origins are patent. No mediastinal lymphadenopathy. No abnormal mediastinal gas or fluid collections. Lungs/Pleura: Atelectasis in the lung bases. Focal patchy infiltration or atelectasis in the left mid lung. No definite evidence of consolidation or contusion. Airways are patent. No pleural effusions. No pneumothorax. Musculoskeletal: Normal alignment of the thoracic spine. Posterior elements appear intact. No vertebral compression deformities. Degenerative changes throughout the  spine. No acute displaced rib fractures a demonstrated. Sternum is nondepressed. Visualized portions of the shoulders and clavicles appear intact. CT ABDOMEN PELVIS FINDINGS Hepatobiliary: Sub cm low-attenuation lesion consistent with small cysts. Gallbladder and bile ducts are normal. Pancreas: Normal. Spleen: Normal. Adrenals/Urinary Tract: No adrenal gland nodules. Sub cm low-attenuation lesions  in the kidneys likely representing cysts. Small parapelvic cyst on the left. No hydronephrosis or hydroureter. Renal nephrograms are symmetrical. Bladder wall is not thickened and no filling defects are demonstrated. Stomach/Bowel: Stomach, small bowel, and colon are not abnormally distended. Anterior abdominal wall hernia containing a portion of the transverse colon without proximal obstruction. Vascular/Lymphatic: Calcification and atherosclerotic change in the abdominal aorta. Infrarenal abdominal aortic aneurysm measuring 3.1 cm AP diameter. No aortic dissection IVC is unremarkable. No retroperitoneal lymphadenopathy. Reproductive: Uterus and ovaries are not enlarged. No pelvic mass or lymphadenopathy. Other: No free air or free fluid in the abdomen. No free or loculated pelvic fluid collections. Musculoskeletal: Lumbar scoliosis and degenerative changes. No vertebral compression deformities. Posterior elements appear intact. Sacrum, pelvis, and hips appear intact. IMPRESSION: No acute posttraumatic changes demonstrated in the chest, abdomen, or pelvis. Atelectasis in the lung bases. Atherosclerotic changes throughout the thoracoabdominal aorta. Small abdominal aortic aneurysm measuring 3.1 cm diameter. Ventral abdominal wall hernia containing fat and a portion of the transverse colon. No proximal obstruction. Electronically Signed   By: Lucienne Capers M.D.   On: 12/09/2014 00:10   Dg Tibia/fibula Right Port  12/09/2014  CLINICAL DATA:  Pt states she fell 1 week ago in her kitchen and has been having distal  right lower leg pain since; verified with RN Vickie that right tib/fib was to be done b/c order notes stated left; per notes in pt chart, the right leg was to be done. Initial encounter. EXAM: PORTABLE RIGHT TIBIA AND FIBULA - 2 VIEW COMPARISON:  None. FINDINGS: Mild joint space narrowing involves the medial compartment of the knee. No acute fracture or dislocation. IMPRESSION: No acute osseous abnormality. Electronically Signed   By: Abigail Miyamoto M.D.   On: 12/09/2014 09:15    Micro Results     No results found for this or any previous visit (from the past 240 hour(s)).     Today   Subjective:   Jane Farley today has no headache,no chest abdominal pain,no new weakness tingling or numbness, feels much better today , denies any lower back pain  Objective:   Blood pressure 128/64, pulse 66, temperature 99.3 F (37.4 C), temperature source Oral, resp. rate 16, height 5\' 2"  (1.575 m), weight 59.875 kg (132 lb), SpO2 92 %.   Intake/Output Summary (Last 24 hours) at 12/10/14 1429 Last data filed at 12/10/14 1032  Gross per 24 hour  Intake    480 ml  Output    700 ml  Net   -220 ml    Exam Awake Alert, Oriented X 3,  [place].AT,PERRAL Supple Neck,No JVD, No cervical lymphadenopathy appriciated.  Symmetrical Chest wall movement, Good air movement bilaterally, CTAB RRR,No Gallops,Rubs or new Murmurs, No Parasternal Heave +ve B.Sounds, Abd Soft, No tenderness, No organomegaly appriciated, No rebound - guarding or rigidity. No Cyanosis, Clubbing or edema, No new Rash or bruise  Data Review   CBC w Diff: Lab Results  Component Value Date   WBC 7.1 12/09/2014   HGB 10.9* 12/09/2014   HCT 33.0* 12/09/2014   PLT 240 12/09/2014   LYMPHOPCT 17 12/08/2014   MONOPCT 4 12/08/2014   EOSPCT 0 12/08/2014   BASOPCT 0 12/08/2014    CMP: Lab Results  Component Value Date   NA 140 12/09/2014   K 3.7 12/09/2014   CL 105 12/09/2014   CO2 26 12/09/2014   BUN 9 12/09/2014   CREATININE  0.75 12/09/2014   PROT 6.5 12/08/2014   ALBUMIN 3.5 12/08/2014  BILITOT 0.6 12/08/2014   ALKPHOS 54 12/08/2014   AST 19 12/08/2014   ALT 13* 12/08/2014  .   Total Time in preparing paper work, data evaluation and todays exam - 35 minutes  Eren Ryser M.D on 12/10/2014 at 2:29 PM  [facility]   Office  [phone]

## 2014-12-11 DIAGNOSIS — E785 Hyperlipidemia, unspecified: Secondary | ICD-10-CM | POA: Diagnosis not present

## 2014-12-11 DIAGNOSIS — I480 Paroxysmal atrial fibrillation: Secondary | ICD-10-CM | POA: Diagnosis not present

## 2014-12-11 DIAGNOSIS — S32010D Wedge compression fracture of first lumbar vertebra, subsequent encounter for fracture with routine healing: Secondary | ICD-10-CM | POA: Diagnosis not present

## 2014-12-11 DIAGNOSIS — W19XXXD Unspecified fall, subsequent encounter: Secondary | ICD-10-CM | POA: Diagnosis not present

## 2014-12-11 DIAGNOSIS — I10 Essential (primary) hypertension: Secondary | ICD-10-CM | POA: Diagnosis not present

## 2014-12-11 DIAGNOSIS — Z8673 Personal history of transient ischemic attack (TIA), and cerebral infarction without residual deficits: Secondary | ICD-10-CM | POA: Diagnosis not present

## 2014-12-11 NOTE — Progress Notes (Signed)
Laureen Abrahams to be D/C'd Home per MD order.  Discussed with the patient and all questions fully answered.  VSS, Skin clean, dry and intact without evidence of skin break down, no evidence of skin tears noted. IV catheter discontinued intact. Site without signs and symptoms of complications. Dressing and pressure applied.  An After Visit Summary was printed and given to the patient. Patient received prescription.  D/c education completed with patient/family including follow up instructions, medication list, d/c activities limitations if indicated, with other d/c instructions as indicated by MD - patient able to verbalize understanding, all questions fully answered.   Patient instructed to return to ED, call 911, or call MD for any changes in condition.   Patient escorted via [place], and D/C home via private auto.  Elon Jester 12/10/14 @ 1630

## 2014-12-12 DIAGNOSIS — E785 Hyperlipidemia, unspecified: Secondary | ICD-10-CM | POA: Diagnosis not present

## 2014-12-12 DIAGNOSIS — I480 Paroxysmal atrial fibrillation: Secondary | ICD-10-CM | POA: Diagnosis not present

## 2014-12-12 DIAGNOSIS — W19XXXD Unspecified fall, subsequent encounter: Secondary | ICD-10-CM | POA: Diagnosis not present

## 2014-12-12 DIAGNOSIS — I10 Essential (primary) hypertension: Secondary | ICD-10-CM | POA: Diagnosis not present

## 2014-12-12 DIAGNOSIS — S32010D Wedge compression fracture of first lumbar vertebra, subsequent encounter for fracture with routine healing: Secondary | ICD-10-CM | POA: Diagnosis not present

## 2014-12-12 DIAGNOSIS — Z8673 Personal history of transient ischemic attack (TIA), and cerebral infarction without residual deficits: Secondary | ICD-10-CM | POA: Diagnosis not present

## 2014-12-13 ENCOUNTER — Encounter: Payer: Self-pay | Admitting: Cardiology

## 2014-12-13 DIAGNOSIS — W19XXXD Unspecified fall, subsequent encounter: Secondary | ICD-10-CM | POA: Diagnosis not present

## 2014-12-13 DIAGNOSIS — S32010D Wedge compression fracture of first lumbar vertebra, subsequent encounter for fracture with routine healing: Secondary | ICD-10-CM | POA: Diagnosis not present

## 2014-12-13 DIAGNOSIS — I480 Paroxysmal atrial fibrillation: Secondary | ICD-10-CM | POA: Diagnosis not present

## 2014-12-13 DIAGNOSIS — I10 Essential (primary) hypertension: Secondary | ICD-10-CM | POA: Diagnosis not present

## 2014-12-13 DIAGNOSIS — Z8673 Personal history of transient ischemic attack (TIA), and cerebral infarction without residual deficits: Secondary | ICD-10-CM | POA: Diagnosis not present

## 2014-12-13 DIAGNOSIS — E785 Hyperlipidemia, unspecified: Secondary | ICD-10-CM | POA: Diagnosis not present

## 2014-12-14 DIAGNOSIS — I480 Paroxysmal atrial fibrillation: Secondary | ICD-10-CM | POA: Diagnosis not present

## 2014-12-14 DIAGNOSIS — Z8673 Personal history of transient ischemic attack (TIA), and cerebral infarction without residual deficits: Secondary | ICD-10-CM | POA: Diagnosis not present

## 2014-12-14 DIAGNOSIS — E785 Hyperlipidemia, unspecified: Secondary | ICD-10-CM | POA: Diagnosis not present

## 2014-12-14 DIAGNOSIS — I10 Essential (primary) hypertension: Secondary | ICD-10-CM | POA: Diagnosis not present

## 2014-12-14 DIAGNOSIS — W19XXXD Unspecified fall, subsequent encounter: Secondary | ICD-10-CM | POA: Diagnosis not present

## 2014-12-14 DIAGNOSIS — S32010D Wedge compression fracture of first lumbar vertebra, subsequent encounter for fracture with routine healing: Secondary | ICD-10-CM | POA: Diagnosis not present

## 2014-12-17 DIAGNOSIS — Z8673 Personal history of transient ischemic attack (TIA), and cerebral infarction without residual deficits: Secondary | ICD-10-CM | POA: Diagnosis not present

## 2014-12-17 DIAGNOSIS — W19XXXD Unspecified fall, subsequent encounter: Secondary | ICD-10-CM | POA: Diagnosis not present

## 2014-12-17 DIAGNOSIS — S32010D Wedge compression fracture of first lumbar vertebra, subsequent encounter for fracture with routine healing: Secondary | ICD-10-CM | POA: Diagnosis not present

## 2014-12-17 DIAGNOSIS — I10 Essential (primary) hypertension: Secondary | ICD-10-CM | POA: Diagnosis not present

## 2014-12-17 DIAGNOSIS — I480 Paroxysmal atrial fibrillation: Secondary | ICD-10-CM | POA: Diagnosis not present

## 2014-12-17 DIAGNOSIS — E785 Hyperlipidemia, unspecified: Secondary | ICD-10-CM | POA: Diagnosis not present

## 2014-12-18 DIAGNOSIS — I10 Essential (primary) hypertension: Secondary | ICD-10-CM | POA: Diagnosis not present

## 2014-12-18 DIAGNOSIS — W19XXXD Unspecified fall, subsequent encounter: Secondary | ICD-10-CM | POA: Diagnosis not present

## 2014-12-18 DIAGNOSIS — I480 Paroxysmal atrial fibrillation: Secondary | ICD-10-CM | POA: Diagnosis not present

## 2014-12-18 DIAGNOSIS — Z8673 Personal history of transient ischemic attack (TIA), and cerebral infarction without residual deficits: Secondary | ICD-10-CM | POA: Diagnosis not present

## 2014-12-18 DIAGNOSIS — S32010D Wedge compression fracture of first lumbar vertebra, subsequent encounter for fracture with routine healing: Secondary | ICD-10-CM | POA: Diagnosis not present

## 2014-12-18 DIAGNOSIS — E785 Hyperlipidemia, unspecified: Secondary | ICD-10-CM | POA: Diagnosis not present

## 2014-12-20 DIAGNOSIS — Z8673 Personal history of transient ischemic attack (TIA), and cerebral infarction without residual deficits: Secondary | ICD-10-CM | POA: Diagnosis not present

## 2014-12-20 DIAGNOSIS — S32010D Wedge compression fracture of first lumbar vertebra, subsequent encounter for fracture with routine healing: Secondary | ICD-10-CM | POA: Diagnosis not present

## 2014-12-20 DIAGNOSIS — I480 Paroxysmal atrial fibrillation: Secondary | ICD-10-CM | POA: Diagnosis not present

## 2014-12-20 DIAGNOSIS — I10 Essential (primary) hypertension: Secondary | ICD-10-CM | POA: Diagnosis not present

## 2014-12-20 DIAGNOSIS — E785 Hyperlipidemia, unspecified: Secondary | ICD-10-CM | POA: Diagnosis not present

## 2014-12-20 DIAGNOSIS — W19XXXD Unspecified fall, subsequent encounter: Secondary | ICD-10-CM | POA: Diagnosis not present

## 2014-12-24 DIAGNOSIS — W19XXXD Unspecified fall, subsequent encounter: Secondary | ICD-10-CM | POA: Diagnosis not present

## 2014-12-24 DIAGNOSIS — S32010D Wedge compression fracture of first lumbar vertebra, subsequent encounter for fracture with routine healing: Secondary | ICD-10-CM | POA: Diagnosis not present

## 2014-12-24 DIAGNOSIS — Z8673 Personal history of transient ischemic attack (TIA), and cerebral infarction without residual deficits: Secondary | ICD-10-CM | POA: Diagnosis not present

## 2014-12-24 DIAGNOSIS — E785 Hyperlipidemia, unspecified: Secondary | ICD-10-CM | POA: Diagnosis not present

## 2014-12-24 DIAGNOSIS — I10 Essential (primary) hypertension: Secondary | ICD-10-CM | POA: Diagnosis not present

## 2014-12-24 DIAGNOSIS — I480 Paroxysmal atrial fibrillation: Secondary | ICD-10-CM | POA: Diagnosis not present

## 2014-12-26 DIAGNOSIS — W19XXXD Unspecified fall, subsequent encounter: Secondary | ICD-10-CM | POA: Diagnosis not present

## 2014-12-26 DIAGNOSIS — I10 Essential (primary) hypertension: Secondary | ICD-10-CM | POA: Diagnosis not present

## 2014-12-26 DIAGNOSIS — E785 Hyperlipidemia, unspecified: Secondary | ICD-10-CM | POA: Diagnosis not present

## 2014-12-26 DIAGNOSIS — I480 Paroxysmal atrial fibrillation: Secondary | ICD-10-CM | POA: Diagnosis not present

## 2014-12-26 DIAGNOSIS — S32010D Wedge compression fracture of first lumbar vertebra, subsequent encounter for fracture with routine healing: Secondary | ICD-10-CM | POA: Diagnosis not present

## 2014-12-26 DIAGNOSIS — Z8673 Personal history of transient ischemic attack (TIA), and cerebral infarction without residual deficits: Secondary | ICD-10-CM | POA: Diagnosis not present

## 2014-12-27 DIAGNOSIS — S32010D Wedge compression fracture of first lumbar vertebra, subsequent encounter for fracture with routine healing: Secondary | ICD-10-CM | POA: Diagnosis not present

## 2014-12-27 DIAGNOSIS — Z8673 Personal history of transient ischemic attack (TIA), and cerebral infarction without residual deficits: Secondary | ICD-10-CM | POA: Diagnosis not present

## 2014-12-27 DIAGNOSIS — I480 Paroxysmal atrial fibrillation: Secondary | ICD-10-CM | POA: Diagnosis not present

## 2014-12-27 DIAGNOSIS — E785 Hyperlipidemia, unspecified: Secondary | ICD-10-CM | POA: Diagnosis not present

## 2014-12-27 DIAGNOSIS — W19XXXD Unspecified fall, subsequent encounter: Secondary | ICD-10-CM | POA: Diagnosis not present

## 2014-12-27 DIAGNOSIS — I10 Essential (primary) hypertension: Secondary | ICD-10-CM | POA: Diagnosis not present

## 2014-12-31 DIAGNOSIS — E784 Other hyperlipidemia: Secondary | ICD-10-CM | POA: Diagnosis not present

## 2014-12-31 DIAGNOSIS — M810 Age-related osteoporosis without current pathological fracture: Secondary | ICD-10-CM | POA: Diagnosis not present

## 2015-01-01 DIAGNOSIS — I10 Essential (primary) hypertension: Secondary | ICD-10-CM | POA: Diagnosis not present

## 2015-01-01 DIAGNOSIS — R8290 Unspecified abnormal findings in urine: Secondary | ICD-10-CM | POA: Diagnosis not present

## 2015-01-01 DIAGNOSIS — N390 Urinary tract infection, site not specified: Secondary | ICD-10-CM | POA: Diagnosis not present

## 2015-01-02 DIAGNOSIS — I10 Essential (primary) hypertension: Secondary | ICD-10-CM | POA: Diagnosis not present

## 2015-01-02 DIAGNOSIS — I480 Paroxysmal atrial fibrillation: Secondary | ICD-10-CM | POA: Diagnosis not present

## 2015-01-02 DIAGNOSIS — W19XXXD Unspecified fall, subsequent encounter: Secondary | ICD-10-CM | POA: Diagnosis not present

## 2015-01-02 DIAGNOSIS — E785 Hyperlipidemia, unspecified: Secondary | ICD-10-CM | POA: Diagnosis not present

## 2015-01-02 DIAGNOSIS — Z8673 Personal history of transient ischemic attack (TIA), and cerebral infarction without residual deficits: Secondary | ICD-10-CM | POA: Diagnosis not present

## 2015-01-02 DIAGNOSIS — S32010D Wedge compression fracture of first lumbar vertebra, subsequent encounter for fracture with routine healing: Secondary | ICD-10-CM | POA: Diagnosis not present

## 2015-01-03 DIAGNOSIS — E785 Hyperlipidemia, unspecified: Secondary | ICD-10-CM | POA: Diagnosis not present

## 2015-01-03 DIAGNOSIS — I480 Paroxysmal atrial fibrillation: Secondary | ICD-10-CM | POA: Diagnosis not present

## 2015-01-03 DIAGNOSIS — I10 Essential (primary) hypertension: Secondary | ICD-10-CM | POA: Diagnosis not present

## 2015-01-03 DIAGNOSIS — W19XXXD Unspecified fall, subsequent encounter: Secondary | ICD-10-CM | POA: Diagnosis not present

## 2015-01-03 DIAGNOSIS — S32010D Wedge compression fracture of first lumbar vertebra, subsequent encounter for fracture with routine healing: Secondary | ICD-10-CM | POA: Diagnosis not present

## 2015-01-03 DIAGNOSIS — Z8673 Personal history of transient ischemic attack (TIA), and cerebral infarction without residual deficits: Secondary | ICD-10-CM | POA: Diagnosis not present

## 2015-01-07 DIAGNOSIS — Z1389 Encounter for screening for other disorder: Secondary | ICD-10-CM | POA: Diagnosis not present

## 2015-01-07 DIAGNOSIS — N200 Calculus of kidney: Secondary | ICD-10-CM | POA: Diagnosis not present

## 2015-01-07 DIAGNOSIS — D692 Other nonthrombocytopenic purpura: Secondary | ICD-10-CM | POA: Diagnosis not present

## 2015-01-07 DIAGNOSIS — R413 Other amnesia: Secondary | ICD-10-CM | POA: Diagnosis not present

## 2015-01-07 DIAGNOSIS — F321 Major depressive disorder, single episode, moderate: Secondary | ICD-10-CM | POA: Diagnosis not present

## 2015-01-07 DIAGNOSIS — Z0000 Encounter for general adult medical examination without abnormal findings: Secondary | ICD-10-CM | POA: Diagnosis not present

## 2015-01-07 DIAGNOSIS — I10 Essential (primary) hypertension: Secondary | ICD-10-CM | POA: Diagnosis not present

## 2015-01-07 DIAGNOSIS — Z7901 Long term (current) use of anticoagulants: Secondary | ICD-10-CM | POA: Diagnosis not present

## 2015-01-07 DIAGNOSIS — Z6823 Body mass index (BMI) 23.0-23.9, adult: Secondary | ICD-10-CM | POA: Diagnosis not present

## 2015-01-07 DIAGNOSIS — Z23 Encounter for immunization: Secondary | ICD-10-CM | POA: Diagnosis not present

## 2015-01-07 DIAGNOSIS — D214 Benign neoplasm of connective and other soft tissue of abdomen: Secondary | ICD-10-CM | POA: Diagnosis not present

## 2015-01-07 DIAGNOSIS — I639 Cerebral infarction, unspecified: Secondary | ICD-10-CM | POA: Diagnosis not present

## 2015-01-08 DIAGNOSIS — Z8673 Personal history of transient ischemic attack (TIA), and cerebral infarction without residual deficits: Secondary | ICD-10-CM | POA: Diagnosis not present

## 2015-01-08 DIAGNOSIS — W19XXXD Unspecified fall, subsequent encounter: Secondary | ICD-10-CM | POA: Diagnosis not present

## 2015-01-08 DIAGNOSIS — S32010D Wedge compression fracture of first lumbar vertebra, subsequent encounter for fracture with routine healing: Secondary | ICD-10-CM | POA: Diagnosis not present

## 2015-01-08 DIAGNOSIS — E785 Hyperlipidemia, unspecified: Secondary | ICD-10-CM | POA: Diagnosis not present

## 2015-01-08 DIAGNOSIS — I10 Essential (primary) hypertension: Secondary | ICD-10-CM | POA: Diagnosis not present

## 2015-01-08 DIAGNOSIS — I480 Paroxysmal atrial fibrillation: Secondary | ICD-10-CM | POA: Diagnosis not present

## 2015-01-10 DIAGNOSIS — I10 Essential (primary) hypertension: Secondary | ICD-10-CM | POA: Diagnosis not present

## 2015-01-10 DIAGNOSIS — S32010D Wedge compression fracture of first lumbar vertebra, subsequent encounter for fracture with routine healing: Secondary | ICD-10-CM | POA: Diagnosis not present

## 2015-01-10 DIAGNOSIS — I480 Paroxysmal atrial fibrillation: Secondary | ICD-10-CM | POA: Diagnosis not present

## 2015-01-10 DIAGNOSIS — Z8673 Personal history of transient ischemic attack (TIA), and cerebral infarction without residual deficits: Secondary | ICD-10-CM | POA: Diagnosis not present

## 2015-01-10 DIAGNOSIS — W19XXXD Unspecified fall, subsequent encounter: Secondary | ICD-10-CM | POA: Diagnosis not present

## 2015-01-10 DIAGNOSIS — E785 Hyperlipidemia, unspecified: Secondary | ICD-10-CM | POA: Diagnosis not present

## 2015-01-15 DIAGNOSIS — E785 Hyperlipidemia, unspecified: Secondary | ICD-10-CM | POA: Diagnosis not present

## 2015-01-15 DIAGNOSIS — I10 Essential (primary) hypertension: Secondary | ICD-10-CM | POA: Diagnosis not present

## 2015-01-15 DIAGNOSIS — I480 Paroxysmal atrial fibrillation: Secondary | ICD-10-CM | POA: Diagnosis not present

## 2015-01-15 DIAGNOSIS — Z8673 Personal history of transient ischemic attack (TIA), and cerebral infarction without residual deficits: Secondary | ICD-10-CM | POA: Diagnosis not present

## 2015-01-15 DIAGNOSIS — W19XXXD Unspecified fall, subsequent encounter: Secondary | ICD-10-CM | POA: Diagnosis not present

## 2015-01-15 DIAGNOSIS — S32010D Wedge compression fracture of first lumbar vertebra, subsequent encounter for fracture with routine healing: Secondary | ICD-10-CM | POA: Diagnosis not present

## 2015-01-30 DIAGNOSIS — E785 Hyperlipidemia, unspecified: Secondary | ICD-10-CM | POA: Diagnosis not present

## 2015-01-30 DIAGNOSIS — W19XXXD Unspecified fall, subsequent encounter: Secondary | ICD-10-CM | POA: Diagnosis not present

## 2015-01-30 DIAGNOSIS — I10 Essential (primary) hypertension: Secondary | ICD-10-CM | POA: Diagnosis not present

## 2015-01-30 DIAGNOSIS — I480 Paroxysmal atrial fibrillation: Secondary | ICD-10-CM | POA: Diagnosis not present

## 2015-01-30 DIAGNOSIS — Z8673 Personal history of transient ischemic attack (TIA), and cerebral infarction without residual deficits: Secondary | ICD-10-CM | POA: Diagnosis not present

## 2015-01-30 DIAGNOSIS — S32010D Wedge compression fracture of first lumbar vertebra, subsequent encounter for fracture with routine healing: Secondary | ICD-10-CM | POA: Diagnosis not present

## 2015-09-16 ENCOUNTER — Other Ambulatory Visit: Payer: Self-pay | Admitting: *Deleted

## 2015-09-16 MED ORDER — APIXABAN 5 MG PO TABS: 5.0000 mg | ORAL_TABLET | Freq: Two times a day (BID) | ORAL | 0 refills | Status: AC

## 2015-10-19 ENCOUNTER — Other Ambulatory Visit: Payer: Self-pay | Admitting: Internal Medicine

## 2015-10-21 NOTE — Telephone Encounter (Signed)
Please advise on refill request as patient has not been seen since 03/13/14, but is prn follow up.

## 2015-10-21 NOTE — Telephone Encounter (Signed)
Please obtain from Neuro

## 2015-11-07 DIAGNOSIS — E0781 Sick-euthyroid syndrome: Secondary | ICD-10-CM | POA: Diagnosis not present

## 2015-11-07 DIAGNOSIS — M810 Age-related osteoporosis without current pathological fracture: Secondary | ICD-10-CM | POA: Diagnosis not present

## 2015-11-07 DIAGNOSIS — E7889 Other lipoprotein metabolism disorders: Secondary | ICD-10-CM | POA: Diagnosis not present

## 2015-11-07 DIAGNOSIS — I10 Essential (primary) hypertension: Secondary | ICD-10-CM | POA: Diagnosis not present

## 2015-11-07 DIAGNOSIS — I69998 Other sequelae following unspecified cerebrovascular disease: Secondary | ICD-10-CM | POA: Diagnosis not present

## 2015-11-07 DIAGNOSIS — Z6823 Body mass index (BMI) 23.0-23.9, adult: Secondary | ICD-10-CM | POA: Diagnosis not present

## 2015-11-07 DIAGNOSIS — Z7901 Long term (current) use of anticoagulants: Secondary | ICD-10-CM | POA: Diagnosis not present

## 2015-11-07 DIAGNOSIS — S32010A Wedge compression fracture of first lumbar vertebra, initial encounter for closed fracture: Secondary | ICD-10-CM | POA: Diagnosis not present

## 2015-11-07 DIAGNOSIS — Z23 Encounter for immunization: Secondary | ICD-10-CM | POA: Diagnosis not present

## 2015-11-07 DIAGNOSIS — D692 Other nonthrombocytopenic purpura: Secondary | ICD-10-CM | POA: Diagnosis not present

## 2015-11-07 DIAGNOSIS — F321 Major depressive disorder, single episode, moderate: Secondary | ICD-10-CM | POA: Diagnosis not present

## 2015-11-07 DIAGNOSIS — R413 Other amnesia: Secondary | ICD-10-CM | POA: Diagnosis not present

## 2016-01-07 DIAGNOSIS — I10 Essential (primary) hypertension: Secondary | ICD-10-CM | POA: Diagnosis not present

## 2016-01-07 DIAGNOSIS — M810 Age-related osteoporosis without current pathological fracture: Secondary | ICD-10-CM | POA: Diagnosis not present

## 2016-01-07 DIAGNOSIS — E7800 Pure hypercholesterolemia, unspecified: Secondary | ICD-10-CM | POA: Diagnosis not present

## 2016-01-08 DIAGNOSIS — R413 Other amnesia: Secondary | ICD-10-CM | POA: Diagnosis not present

## 2016-01-08 DIAGNOSIS — I639 Cerebral infarction, unspecified: Secondary | ICD-10-CM | POA: Diagnosis not present

## 2016-01-08 DIAGNOSIS — E7800 Pure hypercholesterolemia, unspecified: Secondary | ICD-10-CM | POA: Diagnosis not present

## 2016-01-08 DIAGNOSIS — I10 Essential (primary) hypertension: Secondary | ICD-10-CM | POA: Diagnosis not present

## 2016-01-08 DIAGNOSIS — D692 Other nonthrombocytopenic purpura: Secondary | ICD-10-CM | POA: Diagnosis not present

## 2016-01-08 DIAGNOSIS — F321 Major depressive disorder, single episode, moderate: Secondary | ICD-10-CM | POA: Diagnosis not present

## 2016-01-08 DIAGNOSIS — Z1389 Encounter for screening for other disorder: Secondary | ICD-10-CM | POA: Diagnosis not present

## 2016-01-08 DIAGNOSIS — Z6826 Body mass index (BMI) 26.0-26.9, adult: Secondary | ICD-10-CM | POA: Diagnosis not present

## 2016-01-08 DIAGNOSIS — I69998 Other sequelae following unspecified cerebrovascular disease: Secondary | ICD-10-CM | POA: Diagnosis not present

## 2016-01-08 DIAGNOSIS — Z0000 Encounter for general adult medical examination without abnormal findings: Secondary | ICD-10-CM | POA: Diagnosis not present

## 2016-01-08 DIAGNOSIS — E0781 Sick-euthyroid syndrome: Secondary | ICD-10-CM | POA: Diagnosis not present

## 2016-01-08 DIAGNOSIS — S32009S Unspecified fracture of unspecified lumbar vertebra, sequela: Secondary | ICD-10-CM | POA: Diagnosis not present

## 2016-01-21 DIAGNOSIS — D485 Neoplasm of uncertain behavior of skin: Secondary | ICD-10-CM | POA: Diagnosis not present

## 2016-01-21 DIAGNOSIS — C44311 Basal cell carcinoma of skin of nose: Secondary | ICD-10-CM | POA: Diagnosis not present

## 2016-01-21 DIAGNOSIS — L570 Actinic keratosis: Secondary | ICD-10-CM | POA: Diagnosis not present

## 2016-05-21 DIAGNOSIS — C44311 Basal cell carcinoma of skin of nose: Secondary | ICD-10-CM | POA: Diagnosis not present

## 2018-05-30 DIAGNOSIS — I69351 Hemiplegia and hemiparesis following cerebral infarction affecting right dominant side: Secondary | ICD-10-CM | POA: Diagnosis not present

## 2018-05-30 DIAGNOSIS — L602 Onychogryphosis: Secondary | ICD-10-CM | POA: Diagnosis not present

## 2018-05-30 DIAGNOSIS — E7800 Pure hypercholesterolemia, unspecified: Secondary | ICD-10-CM | POA: Diagnosis not present

## 2018-05-30 DIAGNOSIS — I10 Essential (primary) hypertension: Secondary | ICD-10-CM | POA: Diagnosis not present

## 2018-06-02 ENCOUNTER — Other Ambulatory Visit: Payer: Self-pay

## 2018-06-02 ENCOUNTER — Other Ambulatory Visit: Payer: Self-pay | Admitting: *Deleted

## 2018-06-02 NOTE — Patient Outreach (Signed)
[facility]) Care Management  06/02/2018  Jane Farley January 24, 1933 [id number]   Telephone Screen  Referral Date: 06/02/18 Referral Source: MD referral Dr Andria Frames,  Referral Reason: [facility] social worker evaluation - placement options  Can [facility] reach out to her and see if can get help with placement, she is living with her Farley, but niece did not know what to do about long term placement.  Referral provides her number, nephew number and niece number Insurance: medicare ? Blue cross blue shield   Outreach attempt # 1 A to home number disconnected Outreach # 1 B to nephew Jane Farley  number no answer Outreach #  1 C to niece Jane Farley states she very willing to assist as much as possible today but encouraged Jane Farley be the first contact for any further calls from [facility] staff. CM explained to her that Jane Farley did not answer and the patient home number was disconnected  Jane Farley is able to verify HIPAA for Jane Farley Reviewed and addressed referral to [facility] with Jane Farley address and contact information updated in EPIC   Social: Jane Farley is a 85 years old widow. She is presently staying with her elderly Farley who is also in her 29s in her Farley's trailer [phone] [facility] [address] [place]  [phone]) since the death of her son, Jane Farley, 3 weeks ago. Jane Farley had various health issues. The family had attempted to encourage her to take other housing options when Jane Farley was alive but she had refused.  She has impaired speech. She can feed herself but her Farley has to assist with bathing, toileting and dressing. She is unable to complete iADLs (errands, shopping, bills, etc) The family is assisting with transportation at this time but may need options.  When she was living with her son she did not get out much and would spend most of her day watching tv. Now that she is living with her Farley she is a little more interactive. She is now sitting out on the porch at intervals. Jane Farley reports Jane  [employer] Farley has visited when [facility] inquired about depression, grief and grief resources    Jane Farley reports Jane Farley is now a little overwhelmed with Jane Reif's care. The family is not sure if this living arrangement is the best option as the Farley is the primary care giver without other support in the home. Jane Farley states it may be better if there was more support for both elderly ladies Pt always rented her home and presently does not have a home to return to.    Conditions: atrial fibrillation, HTN, PFO, Fx of lumber aphasia HLD, fall intractable pain, basal cell carcinoma 01/08/16, MDD, hemiparesis affecting right side as late effect of CVA Fall:  Kim states she stumbles at intervals and did have fall without injury when she first arrived at her sisters about 3 weeks ago. Jane Farley reports Jane Farley called EMS  DME: walker   Medications: She does not remembers when to take her medications Her Farley administers them to her She had run out all of her medications but they all have been obtained  Jane Farley denies affording medications at this time, side effects of medications and questions about medications Jane Farley reports her eliquis cost was at $500+ but Dr Dorthy Cooler changed her to ASA on 05/30/18 and the cost issue was resolved this week Her [facility] this week to also assist with the cost  Appointments: went to Dr Dorthy Cooler on 05/30/18, Monday   Advance Directives: She does not have advance directives but Jane Farley reports Jane Farley is open to assist if needed  Consent: [facility] RN CM reviewed [facility] services with patient. Patient gave verbal consent for services [facility] telephonic RN CM and [facility] .   Plan: [facility] RN CM will refer Jane Farley to [facility] for assessment of need for possible grief counseling, need of personal care services and community resources, for assistance with referral reason: [facility] social worker evaluation - placement options  Can [facility] reach out to her and  see if can get help with placement, she is living with her Farley, but niece did not know what to do about long term placement.   Note routed to MD   Jane Millin L. Lavina Hamman, RN, BSN, [place] Coordinator Office number [phone] Mobile number [phone]  Main [facility] number [phone] Fax number [phone]

## 2018-06-08 ENCOUNTER — Other Ambulatory Visit: Payer: Self-pay | Admitting: *Deleted

## 2018-06-08 ENCOUNTER — Encounter: Payer: Self-pay | Admitting: *Deleted

## 2018-06-08 NOTE — Patient Outreach (Addendum)
[facility]) Care Management  06/08/2018  Jane Farley 11-Mar-1932 [id number]   CSW received a return call from patient's sister, Jane Farley (# [phone]) today, in response to the HIPAA compliant message left on voicemail for patient earlier in the day.  Jane Farley indicated that patient is unable to come to the phone because she is "not well", but Jane Farley reported that she is patient's primary caregiver and able to answer all of CSW's questions regarding patient.  CSW was able to get patient to agree to allow CSW to converse with Jane Farley, by having Jane Farley ask patient the question, then putting the speaker up to patient's mouth for CSW to hear patient voice "yes".  CSW was able to perform the initial phone assessment for patient with Jane Farley, as well as assess and assist with social work needs and services.  CSW introduced self, explained role and types of services provided through [facility] ([place] [facility]).    CSW further explained to Jane Farley that [facility] works with patient's RNCM, also with [place] [facility], Jane Farley. CSW then explained the reason for the call, indicating that Jane Farley thought that patient would benefit from social work services and resources to assist with initiating placement into an assisted living facility to receive long-term care services.  CSW obtained two HIPAA compliant identifiers from Jane Farley, which included patient's name and date of birth.  Jane Farley indicated that patient has been living with her for the past month, ever since her son died in 2018/05/19.  Prior to patient moving in with Jane Farley, patient was being cared for in her home by her son, since having a stroke 6 years ago.  Jane Farley admitted that she is doing the best she can to care for patient, but that she is elderly herself, and not able to care for patient long-term.  Jane Farley went on to say that her  son, patient's nephew, Jane Farley) is now in charge of handling all of patient's affairs, encouraging CSW to speak with Jane Farley directly.  CSW was able to speak with Jane Farley and explain the entire placement process.  Jane Farley admitted that patient does not have the financial means to pay for assisted living placement with just her pension and [place]; therefore, CSW encouraged Jane Farley to go ahead and apply for [place] Medicaid for patient, at the [place].  CSW provided Jane Farley with the direct contact information for a Medicaid caseworker, explaining that the [place] is currently closed due to the Coronavirus.  Jane Farley agreed to contact a Medicaid caseworker within the next few days to complete the application.  In the meantime, CSW agreed to contact patient's Primary Care Physician, Jane Farley to request completion of an FL-2 Form, as well as request an order for a TB Skin Test to be placed.  CSW also agreed to email Jane Farley a list of all assisted living facilities in [facility] for his review.  Jane Farley email address:  [email or web address].  Jane Farley was encouraged to review the list with patient and Jane Farley to decide which facilities they may be interested in with regards to long-term care placement arrangements for patient.  CSW then agreed to follow-up with Jane Farley on Monday, June 13, 2018 to obtain the name and contact number of patient's assigned Medicaid  caseworker, as well as receive the list of assisted living facilities that they are interested in having CSW pursue.  CSW will fax patient's FL-2 Form to all facilities of interest to try and obtain bed offers, which will then be discussed with Jane Farley.  Jane Farley, as well as Jane Farley, denied the need for counseling and supportive services for patient, due to the recent  loss of her son, indicating that patient is "handling his death quite well".  Jane Farley went on to say that they have a very supportive family and that everyone has been "rallying around her".  Jane Farley was not receptive to having CSW mail patient information about grief and loss counseling, nor was Jane Farley interested in having CSW mail patient a list of providers in the community.  Jane Farley reported that they are supporting patient the best way they know how, offering prayer and worship.  Nat Christen, BSW, MSW, LCSW  Licensed Education officer, environmental Health System  Mailing [place] [address], [place], [place] [address] Physical Address-[address], [place], [place] [address] Toll Free Main # [phone] Fax # [phone] Cell # [phone]  Office # [phone] [email or web address]

## 2018-06-08 NOTE — Patient Outreach (Signed)
[facility]) Care Management  06/08/2018  Jane Farley November 12, 1932 [id number]   CSW made an initial attempt to try and contact patient today to perform the initial phone assessment, as well as assess and assist with social work needs and services, without success.  A HIPAA compliant message was left for patient on voicemail.  CSW is currently awaiting a return call.  CSW will make a second outreach attempt within the next 3-4 business days, if a return call is not received from patient in the meantime.  CSW will also mail an Outreach Letter to patient's home requesting that patient contact CSW if patient is interested in receiving social work services through [place] with Scientist, clinical (histocompatibility and immunogenetics).  Nat Christen, BSW, MSW, LCSW  Licensed Education officer, environmental Health System  Mailing [place] [address] [place], [place] [address] Physical Address-[address], [place], [place] [address] Toll Free Main # [phone] Fax # [phone] Cell # [phone]  Office # [phone] [email or web address]

## 2018-06-13 ENCOUNTER — Other Ambulatory Visit: Payer: Self-pay | Admitting: *Deleted

## 2018-06-13 NOTE — Patient Outreach (Signed)
[facility]) Care Management  06/13/2018  Jane Farley [birth year] [id number]   CSW was able to make contact with patient's nephew, Legrand Como today to follow-up regarding long-term care placement arrangements for patient.  Mr. Sharol Roussel admitted that he has not yet inquired about completing a [place] Medicaid application for patient, through the [place], as he is waiting for all Coronavirus restrictions to be lifted.  Mr. Sharol Roussel also admitted that he and his mother, Gayland Curry, patient's sister and current primary caregiver, may be thinking about alternate long-term care arrangements for patient.  Mr. Sharol Roussel indicated that he and Mrs. Sharol Roussel have been thinking about patient continuing to reside with Mrs. Sharol Roussel, but receiving additional help in the home.  CSW explained that in-home aide services may be an option for patient, but made Mr. Sharol Roussel aware that this would definitely be an out-of-pocket expense, as Traditional Medicare does not cover the cost and patient does not have [employer], nor does Mr. Sharol Roussel think that patient would be approved for Adult Medicaid, due to all her assets.  CSW emailed a list of in-home care and respite agencies, as well as a list of home health agencies, to Mr. Sharol Roussel and was able to ensure receipt of this information.  CSW agreed to follow-up with Mr. Sharol Roussel again on Monday, June 20, 2018, around 10:00AM, to answer any questions that he may have regarding the list of information and assist with the referral process, if necessary.  Nat Christen, BSW, MSW, LCSW  Licensed Education officer, environmental Health System  Mailing [place] [address] [place], [place] [address] Physical Address-[address], [place], [place] [address] Toll Free Main # [phone] Fax # [phone] Cell # [phone]  Office #  [phone] [email or web address]

## 2018-06-16 ENCOUNTER — Ambulatory Visit: Payer: Self-pay | Admitting: *Deleted

## 2018-06-20 ENCOUNTER — Other Ambulatory Visit: Payer: Self-pay | Admitting: *Deleted

## 2018-06-20 DIAGNOSIS — I10 Essential (primary) hypertension: Secondary | ICD-10-CM | POA: Diagnosis not present

## 2018-06-20 DIAGNOSIS — E7800 Pure hypercholesterolemia, unspecified: Secondary | ICD-10-CM | POA: Diagnosis not present

## 2018-06-20 NOTE — Patient Outreach (Signed)
[facility] [facility]) Care Management  06/20/2018  Jane Farley 10/18/1932 [id number]   CSW made an attempt to try and contact patient's nephew, Legrand Como today to follow-up regarding social work services and resources for patient; however, Mr. Sharol Roussel was not available at the time of CSW's call.  A HIPAA compliant message was left for Mr. Sharol Roussel on voicemail and CSW is currently awaiting a return call.  CSW will make a second outreach attempt within the next 3-4 business day, if a return call is not received from Mr. Sharol Roussel in the meantime.  Nat Christen, BSW, MSW, LCSW  Licensed Education officer, environmental Health System  Mailing [place] [address] [place], [place] [address] Physical Address[address], [place],  [address] Toll Free Main # [phone] Fax # [phone] Cell # [phone]  Office # [phone] [email or web address]

## 2018-06-24 ENCOUNTER — Other Ambulatory Visit: Payer: Self-pay | Admitting: *Deleted

## 2018-06-24 ENCOUNTER — Encounter: Payer: Self-pay | Admitting: *Deleted

## 2018-06-24 NOTE — Patient Outreach (Signed)
[facility]) Care Management  06/24/2018  Jane Farley Jun 16, 1932 [id number]   CSW made a second attempt to try and contact patient's nephew, Jane Farley today to follow-up regarding long-term care placement arrangements for patient; however, Mr. Jane Farley was not available at the time of CSW's call.  A HIPAA compliant message was left for Mr. Jane Farley on voicemail and CSW continues to await a return call.  CSW will make a third and final outreach attempt within the next 3-4 business days, if a return call is not received from Mr. Jane Farley in the meantime.  CSW will also mail an outreach letter to patient's home, encouraging Mr. Jane Farley to return CSW's call if he is still interested in receiving social work services through [facility] with [facility].  Nat Christen, BSW, MSW, LCSW  Licensed Education officer, environmental Health System  Mailing [place] [address], [place], [place] [address] Physical Address-[address], [place], [place] [address] Toll Free Main # [phone] Fax # [phone] Cell # [phone]  Office # [phone] [email or web address]

## 2018-06-30 ENCOUNTER — Encounter: Payer: Self-pay | Admitting: *Deleted

## 2018-06-30 ENCOUNTER — Other Ambulatory Visit: Payer: Self-pay | Admitting: *Deleted

## 2018-06-30 NOTE — Patient Outreach (Signed)
[facility]) Care Management  06/30/2018  STEPHAINE BRESHEARS 07/15/1932 [id number]   CSW was able to make contact with patient's nephew, Legrand Como today to follow-up regarding long-term care placement arrangements for patient versus in-home care services being arranged.  Mr. Sharol Roussel apologized for not returning CSW's two previous calls, but reported that patient is actually doing "quite well" living with her sister, Mr. Yancey Flemings mother.  Mr. Sharol Roussel went on to say that his mother is actually thinking about having patient live with her indefinitely, as she is able to provide 24 hour care and supervision, as well as assist patient with all activities of daily living.  Mr. Sharol Roussel explained that he has CSW's contact information, requesting to be able to contact CSW directly if additional social work needs and/or questions arise in the near future.  Mr. Sharol Roussel admitted that they have no intentions of placing patient into a long-term care assisted living facility at this time, nor is Mr. Sharol Roussel interested in having CSW assist him with arranging in-home care services for patient.  CSW will perform a case closure on patient, as all goals of treatment have been met from social work standpoint and no additional social work needs have been identified at this time.  CSW will fax an update to patient's Primary Care Physician, Dr. Lauretta Grill Koirala to ensure that they are aware of CSW's involvement with patient's plan of care.     Nat Christen, BSW, MSW, LCSW  Licensed Education officer, environmental Health System  Mailing [place] [address], [place], [place] [address] Physical Address-[address], [place], [place] [address] Toll Free Main # [phone] Fax # [phone] Cell # [phone]  Office # [phone] [email or web address]

## 2018-08-01 DIAGNOSIS — R42 Dizziness and giddiness: Secondary | ICD-10-CM | POA: Diagnosis not present

## 2018-08-01 DIAGNOSIS — I10 Essential (primary) hypertension: Secondary | ICD-10-CM | POA: Diagnosis not present

## 2018-08-01 DIAGNOSIS — E7800 Pure hypercholesterolemia, unspecified: Secondary | ICD-10-CM | POA: Diagnosis not present

## 2018-08-01 DIAGNOSIS — I69351 Hemiplegia and hemiparesis following cerebral infarction affecting right dominant side: Secondary | ICD-10-CM | POA: Diagnosis not present

## 2018-09-09 DIAGNOSIS — Z111 Encounter for screening for respiratory tuberculosis: Secondary | ICD-10-CM | POA: Diagnosis not present

## 2018-09-21 DIAGNOSIS — Z79899 Other long term (current) drug therapy: Secondary | ICD-10-CM | POA: Diagnosis not present

## 2018-09-21 DIAGNOSIS — R410 Disorientation, unspecified: Secondary | ICD-10-CM | POA: Diagnosis not present

## 2018-09-21 DIAGNOSIS — I10 Essential (primary) hypertension: Secondary | ICD-10-CM | POA: Diagnosis not present

## 2018-09-21 DIAGNOSIS — Z7189 Other specified counseling: Secondary | ICD-10-CM | POA: Diagnosis not present

## 2018-09-21 DIAGNOSIS — E782 Mixed hyperlipidemia: Secondary | ICD-10-CM | POA: Diagnosis not present

## 2018-09-27 DIAGNOSIS — M24541 Contracture, right hand: Secondary | ICD-10-CM | POA: Diagnosis not present

## 2018-09-27 DIAGNOSIS — I69398 Other sequelae of cerebral infarction: Secondary | ICD-10-CM | POA: Diagnosis not present

## 2018-09-27 DIAGNOSIS — I69351 Hemiplegia and hemiparesis following cerebral infarction affecting right dominant side: Secondary | ICD-10-CM | POA: Diagnosis not present

## 2018-09-28 DIAGNOSIS — Z9181 History of falling: Secondary | ICD-10-CM | POA: Diagnosis not present

## 2018-09-28 DIAGNOSIS — I4891 Unspecified atrial fibrillation: Secondary | ICD-10-CM | POA: Diagnosis not present

## 2018-09-28 DIAGNOSIS — I639 Cerebral infarction, unspecified: Secondary | ICD-10-CM | POA: Diagnosis not present

## 2018-09-28 DIAGNOSIS — I69398 Other sequelae of cerebral infarction: Secondary | ICD-10-CM | POA: Diagnosis not present

## 2018-09-28 DIAGNOSIS — I69351 Hemiplegia and hemiparesis following cerebral infarction affecting right dominant side: Secondary | ICD-10-CM | POA: Diagnosis not present

## 2018-09-28 DIAGNOSIS — M24541 Contracture, right hand: Secondary | ICD-10-CM | POA: Diagnosis not present

## 2018-09-28 DIAGNOSIS — R2681 Unsteadiness on feet: Secondary | ICD-10-CM | POA: Diagnosis not present

## 2018-09-28 DIAGNOSIS — Z7982 Long term (current) use of aspirin: Secondary | ICD-10-CM | POA: Diagnosis not present

## 2018-09-28 DIAGNOSIS — R51 Headache: Secondary | ICD-10-CM | POA: Diagnosis not present

## 2018-09-28 DIAGNOSIS — E785 Hyperlipidemia, unspecified: Secondary | ICD-10-CM | POA: Diagnosis not present

## 2018-09-28 DIAGNOSIS — I10 Essential (primary) hypertension: Secondary | ICD-10-CM | POA: Diagnosis not present

## 2018-09-28 DIAGNOSIS — I69320 Aphasia following cerebral infarction: Secondary | ICD-10-CM | POA: Diagnosis not present

## 2018-09-28 DIAGNOSIS — G4489 Other headache syndrome: Secondary | ICD-10-CM | POA: Diagnosis not present

## 2018-09-28 DIAGNOSIS — E7849 Other hyperlipidemia: Secondary | ICD-10-CM | POA: Diagnosis not present

## 2018-09-29 DIAGNOSIS — R269 Unspecified abnormalities of gait and mobility: Secondary | ICD-10-CM | POA: Diagnosis not present

## 2018-09-29 DIAGNOSIS — I6389 Other cerebral infarction: Secondary | ICD-10-CM | POA: Diagnosis not present

## 2018-09-29 DIAGNOSIS — E782 Mixed hyperlipidemia: Secondary | ICD-10-CM | POA: Diagnosis not present

## 2018-09-29 DIAGNOSIS — G4489 Other headache syndrome: Secondary | ICD-10-CM | POA: Diagnosis not present

## 2018-09-30 DIAGNOSIS — I69320 Aphasia following cerebral infarction: Secondary | ICD-10-CM | POA: Diagnosis not present

## 2018-09-30 DIAGNOSIS — M24541 Contracture, right hand: Secondary | ICD-10-CM | POA: Diagnosis not present

## 2018-09-30 DIAGNOSIS — I69398 Other sequelae of cerebral infarction: Secondary | ICD-10-CM | POA: Diagnosis not present

## 2018-09-30 DIAGNOSIS — I69351 Hemiplegia and hemiparesis following cerebral infarction affecting right dominant side: Secondary | ICD-10-CM | POA: Diagnosis not present

## 2018-09-30 DIAGNOSIS — R2681 Unsteadiness on feet: Secondary | ICD-10-CM | POA: Diagnosis not present

## 2018-09-30 DIAGNOSIS — I10 Essential (primary) hypertension: Secondary | ICD-10-CM | POA: Diagnosis not present

## 2018-10-01 DIAGNOSIS — E782 Mixed hyperlipidemia: Secondary | ICD-10-CM | POA: Diagnosis not present

## 2018-10-01 DIAGNOSIS — Z79899 Other long term (current) drug therapy: Secondary | ICD-10-CM | POA: Diagnosis not present

## 2018-10-01 DIAGNOSIS — I639 Cerebral infarction, unspecified: Secondary | ICD-10-CM | POA: Diagnosis not present

## 2018-10-01 DIAGNOSIS — I10 Essential (primary) hypertension: Secondary | ICD-10-CM | POA: Diagnosis not present

## 2018-10-05 DIAGNOSIS — I69398 Other sequelae of cerebral infarction: Secondary | ICD-10-CM | POA: Diagnosis not present

## 2018-10-05 DIAGNOSIS — R2681 Unsteadiness on feet: Secondary | ICD-10-CM | POA: Diagnosis not present

## 2018-10-05 DIAGNOSIS — I69320 Aphasia following cerebral infarction: Secondary | ICD-10-CM | POA: Diagnosis not present

## 2018-10-05 DIAGNOSIS — I69351 Hemiplegia and hemiparesis following cerebral infarction affecting right dominant side: Secondary | ICD-10-CM | POA: Diagnosis not present

## 2018-10-05 DIAGNOSIS — M24541 Contracture, right hand: Secondary | ICD-10-CM | POA: Diagnosis not present

## 2018-10-05 DIAGNOSIS — I10 Essential (primary) hypertension: Secondary | ICD-10-CM | POA: Diagnosis not present

## 2018-10-07 DIAGNOSIS — I10 Essential (primary) hypertension: Secondary | ICD-10-CM | POA: Diagnosis not present

## 2018-10-07 DIAGNOSIS — I69320 Aphasia following cerebral infarction: Secondary | ICD-10-CM | POA: Diagnosis not present

## 2018-10-07 DIAGNOSIS — M24541 Contracture, right hand: Secondary | ICD-10-CM | POA: Diagnosis not present

## 2018-10-07 DIAGNOSIS — I69351 Hemiplegia and hemiparesis following cerebral infarction affecting right dominant side: Secondary | ICD-10-CM | POA: Diagnosis not present

## 2018-10-07 DIAGNOSIS — R2681 Unsteadiness on feet: Secondary | ICD-10-CM | POA: Diagnosis not present

## 2018-10-07 DIAGNOSIS — I69398 Other sequelae of cerebral infarction: Secondary | ICD-10-CM | POA: Diagnosis not present

## 2018-10-11 DIAGNOSIS — I69351 Hemiplegia and hemiparesis following cerebral infarction affecting right dominant side: Secondary | ICD-10-CM | POA: Diagnosis not present

## 2018-10-11 DIAGNOSIS — R2681 Unsteadiness on feet: Secondary | ICD-10-CM | POA: Diagnosis not present

## 2018-10-11 DIAGNOSIS — M24541 Contracture, right hand: Secondary | ICD-10-CM | POA: Diagnosis not present

## 2018-10-11 DIAGNOSIS — I69320 Aphasia following cerebral infarction: Secondary | ICD-10-CM | POA: Diagnosis not present

## 2018-10-11 DIAGNOSIS — I10 Essential (primary) hypertension: Secondary | ICD-10-CM | POA: Diagnosis not present

## 2018-10-11 DIAGNOSIS — I69398 Other sequelae of cerebral infarction: Secondary | ICD-10-CM | POA: Diagnosis not present

## 2018-10-13 DIAGNOSIS — I10 Essential (primary) hypertension: Secondary | ICD-10-CM | POA: Diagnosis not present

## 2018-10-13 DIAGNOSIS — M24541 Contracture, right hand: Secondary | ICD-10-CM | POA: Diagnosis not present

## 2018-10-13 DIAGNOSIS — I69398 Other sequelae of cerebral infarction: Secondary | ICD-10-CM | POA: Diagnosis not present

## 2018-10-13 DIAGNOSIS — R2681 Unsteadiness on feet: Secondary | ICD-10-CM | POA: Diagnosis not present

## 2018-10-13 DIAGNOSIS — I69320 Aphasia following cerebral infarction: Secondary | ICD-10-CM | POA: Diagnosis not present

## 2018-10-13 DIAGNOSIS — I69351 Hemiplegia and hemiparesis following cerebral infarction affecting right dominant side: Secondary | ICD-10-CM | POA: Diagnosis not present

## 2018-10-19 DIAGNOSIS — I69398 Other sequelae of cerebral infarction: Secondary | ICD-10-CM | POA: Diagnosis not present

## 2018-10-19 DIAGNOSIS — R2681 Unsteadiness on feet: Secondary | ICD-10-CM | POA: Diagnosis not present

## 2018-10-19 DIAGNOSIS — M24541 Contracture, right hand: Secondary | ICD-10-CM | POA: Diagnosis not present

## 2018-10-19 DIAGNOSIS — I69351 Hemiplegia and hemiparesis following cerebral infarction affecting right dominant side: Secondary | ICD-10-CM | POA: Diagnosis not present

## 2018-10-19 DIAGNOSIS — I10 Essential (primary) hypertension: Secondary | ICD-10-CM | POA: Diagnosis not present

## 2018-10-19 DIAGNOSIS — I69320 Aphasia following cerebral infarction: Secondary | ICD-10-CM | POA: Diagnosis not present

## 2018-10-21 DIAGNOSIS — I69398 Other sequelae of cerebral infarction: Secondary | ICD-10-CM | POA: Diagnosis not present

## 2018-10-21 DIAGNOSIS — M24541 Contracture, right hand: Secondary | ICD-10-CM | POA: Diagnosis not present

## 2018-10-21 DIAGNOSIS — I69320 Aphasia following cerebral infarction: Secondary | ICD-10-CM | POA: Diagnosis not present

## 2018-10-21 DIAGNOSIS — R2681 Unsteadiness on feet: Secondary | ICD-10-CM | POA: Diagnosis not present

## 2018-10-21 DIAGNOSIS — I10 Essential (primary) hypertension: Secondary | ICD-10-CM | POA: Diagnosis not present

## 2018-10-21 DIAGNOSIS — I69351 Hemiplegia and hemiparesis following cerebral infarction affecting right dominant side: Secondary | ICD-10-CM | POA: Diagnosis not present

## 2018-10-24 DIAGNOSIS — I10 Essential (primary) hypertension: Secondary | ICD-10-CM | POA: Diagnosis not present

## 2018-10-24 DIAGNOSIS — Z79899 Other long term (current) drug therapy: Secondary | ICD-10-CM | POA: Diagnosis not present

## 2018-10-24 DIAGNOSIS — E782 Mixed hyperlipidemia: Secondary | ICD-10-CM | POA: Diagnosis not present

## 2018-10-24 DIAGNOSIS — I69351 Hemiplegia and hemiparesis following cerebral infarction affecting right dominant side: Secondary | ICD-10-CM | POA: Diagnosis not present

## 2018-10-24 DIAGNOSIS — R410 Disorientation, unspecified: Secondary | ICD-10-CM | POA: Diagnosis not present

## 2018-10-24 DIAGNOSIS — I69398 Other sequelae of cerebral infarction: Secondary | ICD-10-CM | POA: Diagnosis not present

## 2018-10-24 DIAGNOSIS — M24541 Contracture, right hand: Secondary | ICD-10-CM | POA: Diagnosis not present

## 2018-10-24 DIAGNOSIS — R2681 Unsteadiness on feet: Secondary | ICD-10-CM | POA: Diagnosis not present

## 2018-10-24 DIAGNOSIS — I69320 Aphasia following cerebral infarction: Secondary | ICD-10-CM | POA: Diagnosis not present

## 2018-10-24 DIAGNOSIS — Z7189 Other specified counseling: Secondary | ICD-10-CM | POA: Diagnosis not present

## 2018-10-25 DIAGNOSIS — M24541 Contracture, right hand: Secondary | ICD-10-CM | POA: Diagnosis not present

## 2018-10-25 DIAGNOSIS — R2681 Unsteadiness on feet: Secondary | ICD-10-CM | POA: Diagnosis not present

## 2018-10-25 DIAGNOSIS — I69320 Aphasia following cerebral infarction: Secondary | ICD-10-CM | POA: Diagnosis not present

## 2018-10-25 DIAGNOSIS — I69351 Hemiplegia and hemiparesis following cerebral infarction affecting right dominant side: Secondary | ICD-10-CM | POA: Diagnosis not present

## 2018-10-25 DIAGNOSIS — I10 Essential (primary) hypertension: Secondary | ICD-10-CM | POA: Diagnosis not present

## 2018-10-25 DIAGNOSIS — I69398 Other sequelae of cerebral infarction: Secondary | ICD-10-CM | POA: Diagnosis not present

## 2018-10-28 DIAGNOSIS — R2681 Unsteadiness on feet: Secondary | ICD-10-CM | POA: Diagnosis not present

## 2018-10-28 DIAGNOSIS — Z7982 Long term (current) use of aspirin: Secondary | ICD-10-CM | POA: Diagnosis not present

## 2018-10-28 DIAGNOSIS — I69320 Aphasia following cerebral infarction: Secondary | ICD-10-CM | POA: Diagnosis not present

## 2018-10-28 DIAGNOSIS — I69398 Other sequelae of cerebral infarction: Secondary | ICD-10-CM | POA: Diagnosis not present

## 2018-10-28 DIAGNOSIS — I4891 Unspecified atrial fibrillation: Secondary | ICD-10-CM | POA: Diagnosis not present

## 2018-10-28 DIAGNOSIS — M24541 Contracture, right hand: Secondary | ICD-10-CM | POA: Diagnosis not present

## 2018-10-28 DIAGNOSIS — E785 Hyperlipidemia, unspecified: Secondary | ICD-10-CM | POA: Diagnosis not present

## 2018-10-28 DIAGNOSIS — I69351 Hemiplegia and hemiparesis following cerebral infarction affecting right dominant side: Secondary | ICD-10-CM | POA: Diagnosis not present

## 2018-10-28 DIAGNOSIS — Z9181 History of falling: Secondary | ICD-10-CM | POA: Diagnosis not present

## 2018-10-28 DIAGNOSIS — I10 Essential (primary) hypertension: Secondary | ICD-10-CM | POA: Diagnosis not present

## 2018-10-28 DIAGNOSIS — G4489 Other headache syndrome: Secondary | ICD-10-CM | POA: Diagnosis not present

## 2018-11-02 DIAGNOSIS — I69398 Other sequelae of cerebral infarction: Secondary | ICD-10-CM | POA: Diagnosis not present

## 2018-11-02 DIAGNOSIS — I10 Essential (primary) hypertension: Secondary | ICD-10-CM | POA: Diagnosis not present

## 2018-11-02 DIAGNOSIS — I69351 Hemiplegia and hemiparesis following cerebral infarction affecting right dominant side: Secondary | ICD-10-CM | POA: Diagnosis not present

## 2018-11-02 DIAGNOSIS — M24541 Contracture, right hand: Secondary | ICD-10-CM | POA: Diagnosis not present

## 2018-11-02 DIAGNOSIS — R2681 Unsteadiness on feet: Secondary | ICD-10-CM | POA: Diagnosis not present

## 2018-11-02 DIAGNOSIS — I69320 Aphasia following cerebral infarction: Secondary | ICD-10-CM | POA: Diagnosis not present

## 2018-11-04 DIAGNOSIS — R2681 Unsteadiness on feet: Secondary | ICD-10-CM | POA: Diagnosis not present

## 2018-11-04 DIAGNOSIS — I69398 Other sequelae of cerebral infarction: Secondary | ICD-10-CM | POA: Diagnosis not present

## 2018-11-04 DIAGNOSIS — I69320 Aphasia following cerebral infarction: Secondary | ICD-10-CM | POA: Diagnosis not present

## 2018-11-04 DIAGNOSIS — M24541 Contracture, right hand: Secondary | ICD-10-CM | POA: Diagnosis not present

## 2018-11-04 DIAGNOSIS — I69351 Hemiplegia and hemiparesis following cerebral infarction affecting right dominant side: Secondary | ICD-10-CM | POA: Diagnosis not present

## 2018-11-04 DIAGNOSIS — I10 Essential (primary) hypertension: Secondary | ICD-10-CM | POA: Diagnosis not present

## 2018-11-05 DIAGNOSIS — R2681 Unsteadiness on feet: Secondary | ICD-10-CM | POA: Diagnosis not present

## 2018-11-05 DIAGNOSIS — M24541 Contracture, right hand: Secondary | ICD-10-CM | POA: Diagnosis not present

## 2018-11-05 DIAGNOSIS — I69320 Aphasia following cerebral infarction: Secondary | ICD-10-CM | POA: Diagnosis not present

## 2018-11-05 DIAGNOSIS — I10 Essential (primary) hypertension: Secondary | ICD-10-CM | POA: Diagnosis not present

## 2018-11-05 DIAGNOSIS — I69351 Hemiplegia and hemiparesis following cerebral infarction affecting right dominant side: Secondary | ICD-10-CM | POA: Diagnosis not present

## 2018-11-05 DIAGNOSIS — I69398 Other sequelae of cerebral infarction: Secondary | ICD-10-CM | POA: Diagnosis not present

## 2018-11-08 DIAGNOSIS — I69351 Hemiplegia and hemiparesis following cerebral infarction affecting right dominant side: Secondary | ICD-10-CM | POA: Diagnosis not present

## 2018-11-08 DIAGNOSIS — M24541 Contracture, right hand: Secondary | ICD-10-CM | POA: Diagnosis not present

## 2018-11-08 DIAGNOSIS — I69398 Other sequelae of cerebral infarction: Secondary | ICD-10-CM | POA: Diagnosis not present

## 2018-11-08 DIAGNOSIS — R2681 Unsteadiness on feet: Secondary | ICD-10-CM | POA: Diagnosis not present

## 2018-11-08 DIAGNOSIS — I10 Essential (primary) hypertension: Secondary | ICD-10-CM | POA: Diagnosis not present

## 2018-11-08 DIAGNOSIS — I69320 Aphasia following cerebral infarction: Secondary | ICD-10-CM | POA: Diagnosis not present

## 2018-11-09 DIAGNOSIS — I69398 Other sequelae of cerebral infarction: Secondary | ICD-10-CM | POA: Diagnosis not present

## 2018-11-09 DIAGNOSIS — I69320 Aphasia following cerebral infarction: Secondary | ICD-10-CM | POA: Diagnosis not present

## 2018-11-09 DIAGNOSIS — M24541 Contracture, right hand: Secondary | ICD-10-CM | POA: Diagnosis not present

## 2018-11-09 DIAGNOSIS — I10 Essential (primary) hypertension: Secondary | ICD-10-CM | POA: Diagnosis not present

## 2018-11-09 DIAGNOSIS — I69351 Hemiplegia and hemiparesis following cerebral infarction affecting right dominant side: Secondary | ICD-10-CM | POA: Diagnosis not present

## 2018-11-09 DIAGNOSIS — R2681 Unsteadiness on feet: Secondary | ICD-10-CM | POA: Diagnosis not present

## 2018-11-10 DIAGNOSIS — R2681 Unsteadiness on feet: Secondary | ICD-10-CM | POA: Diagnosis not present

## 2018-11-10 DIAGNOSIS — I10 Essential (primary) hypertension: Secondary | ICD-10-CM | POA: Diagnosis not present

## 2018-11-10 DIAGNOSIS — I69398 Other sequelae of cerebral infarction: Secondary | ICD-10-CM | POA: Diagnosis not present

## 2018-11-10 DIAGNOSIS — I69351 Hemiplegia and hemiparesis following cerebral infarction affecting right dominant side: Secondary | ICD-10-CM | POA: Diagnosis not present

## 2018-11-10 DIAGNOSIS — I69320 Aphasia following cerebral infarction: Secondary | ICD-10-CM | POA: Diagnosis not present

## 2018-11-10 DIAGNOSIS — M24541 Contracture, right hand: Secondary | ICD-10-CM | POA: Diagnosis not present

## 2018-11-11 DIAGNOSIS — I10 Essential (primary) hypertension: Secondary | ICD-10-CM | POA: Diagnosis not present

## 2018-11-11 DIAGNOSIS — I69320 Aphasia following cerebral infarction: Secondary | ICD-10-CM | POA: Diagnosis not present

## 2018-11-11 DIAGNOSIS — I69351 Hemiplegia and hemiparesis following cerebral infarction affecting right dominant side: Secondary | ICD-10-CM | POA: Diagnosis not present

## 2018-11-11 DIAGNOSIS — R2681 Unsteadiness on feet: Secondary | ICD-10-CM | POA: Diagnosis not present

## 2018-11-11 DIAGNOSIS — M24541 Contracture, right hand: Secondary | ICD-10-CM | POA: Diagnosis not present

## 2018-11-11 DIAGNOSIS — I69398 Other sequelae of cerebral infarction: Secondary | ICD-10-CM | POA: Diagnosis not present

## 2018-11-14 DIAGNOSIS — I69351 Hemiplegia and hemiparesis following cerebral infarction affecting right dominant side: Secondary | ICD-10-CM | POA: Diagnosis not present

## 2018-11-14 DIAGNOSIS — I69320 Aphasia following cerebral infarction: Secondary | ICD-10-CM | POA: Diagnosis not present

## 2018-11-14 DIAGNOSIS — M24541 Contracture, right hand: Secondary | ICD-10-CM | POA: Diagnosis not present

## 2018-11-14 DIAGNOSIS — R2681 Unsteadiness on feet: Secondary | ICD-10-CM | POA: Diagnosis not present

## 2018-11-14 DIAGNOSIS — I69398 Other sequelae of cerebral infarction: Secondary | ICD-10-CM | POA: Diagnosis not present

## 2018-11-14 DIAGNOSIS — I10 Essential (primary) hypertension: Secondary | ICD-10-CM | POA: Diagnosis not present

## 2018-11-15 DIAGNOSIS — R2681 Unsteadiness on feet: Secondary | ICD-10-CM | POA: Diagnosis not present

## 2018-11-15 DIAGNOSIS — I69398 Other sequelae of cerebral infarction: Secondary | ICD-10-CM | POA: Diagnosis not present

## 2018-11-15 DIAGNOSIS — I10 Essential (primary) hypertension: Secondary | ICD-10-CM | POA: Diagnosis not present

## 2018-11-15 DIAGNOSIS — M24541 Contracture, right hand: Secondary | ICD-10-CM | POA: Diagnosis not present

## 2018-11-15 DIAGNOSIS — I69320 Aphasia following cerebral infarction: Secondary | ICD-10-CM | POA: Diagnosis not present

## 2018-11-15 DIAGNOSIS — I69351 Hemiplegia and hemiparesis following cerebral infarction affecting right dominant side: Secondary | ICD-10-CM | POA: Diagnosis not present

## 2018-11-16 DIAGNOSIS — R2681 Unsteadiness on feet: Secondary | ICD-10-CM | POA: Diagnosis not present

## 2018-11-16 DIAGNOSIS — I69351 Hemiplegia and hemiparesis following cerebral infarction affecting right dominant side: Secondary | ICD-10-CM | POA: Diagnosis not present

## 2018-11-16 DIAGNOSIS — I69398 Other sequelae of cerebral infarction: Secondary | ICD-10-CM | POA: Diagnosis not present

## 2018-11-16 DIAGNOSIS — I10 Essential (primary) hypertension: Secondary | ICD-10-CM | POA: Diagnosis not present

## 2018-11-16 DIAGNOSIS — I69320 Aphasia following cerebral infarction: Secondary | ICD-10-CM | POA: Diagnosis not present

## 2018-11-16 DIAGNOSIS — M24541 Contracture, right hand: Secondary | ICD-10-CM | POA: Diagnosis not present

## 2018-11-21 DIAGNOSIS — I69398 Other sequelae of cerebral infarction: Secondary | ICD-10-CM | POA: Diagnosis not present

## 2018-11-21 DIAGNOSIS — I69351 Hemiplegia and hemiparesis following cerebral infarction affecting right dominant side: Secondary | ICD-10-CM | POA: Diagnosis not present

## 2018-11-21 DIAGNOSIS — M24541 Contracture, right hand: Secondary | ICD-10-CM | POA: Diagnosis not present

## 2018-11-21 DIAGNOSIS — I10 Essential (primary) hypertension: Secondary | ICD-10-CM | POA: Diagnosis not present

## 2018-11-21 DIAGNOSIS — I69320 Aphasia following cerebral infarction: Secondary | ICD-10-CM | POA: Diagnosis not present

## 2018-11-21 DIAGNOSIS — R2681 Unsteadiness on feet: Secondary | ICD-10-CM | POA: Diagnosis not present

## 2018-11-23 DIAGNOSIS — R2681 Unsteadiness on feet: Secondary | ICD-10-CM | POA: Diagnosis not present

## 2018-11-23 DIAGNOSIS — I10 Essential (primary) hypertension: Secondary | ICD-10-CM | POA: Diagnosis not present

## 2018-11-23 DIAGNOSIS — I69351 Hemiplegia and hemiparesis following cerebral infarction affecting right dominant side: Secondary | ICD-10-CM | POA: Diagnosis not present

## 2018-11-23 DIAGNOSIS — I69320 Aphasia following cerebral infarction: Secondary | ICD-10-CM | POA: Diagnosis not present

## 2018-11-23 DIAGNOSIS — M24541 Contracture, right hand: Secondary | ICD-10-CM | POA: Diagnosis not present

## 2018-11-23 DIAGNOSIS — I69398 Other sequelae of cerebral infarction: Secondary | ICD-10-CM | POA: Diagnosis not present

## 2018-12-30 ENCOUNTER — Other Ambulatory Visit: Payer: Self-pay

## 2018-12-30 ENCOUNTER — Inpatient Hospital Stay (HOSPITAL_COMMUNITY): Payer: Medicare Other

## 2018-12-30 ENCOUNTER — Inpatient Hospital Stay (HOSPITAL_COMMUNITY)
Admission: EM | Admit: 2018-12-30 | Discharge: 2019-01-24 | DRG: 064 | Disposition: E | Payer: Medicare Other | Source: Skilled Nursing Facility | Attending: Internal Medicine | Admitting: Internal Medicine

## 2018-12-30 ENCOUNTER — Encounter (HOSPITAL_COMMUNITY): Payer: Self-pay | Admitting: Emergency Medicine

## 2018-12-30 ENCOUNTER — Observation Stay (HOSPITAL_COMMUNITY): Payer: Medicare Other

## 2018-12-30 ENCOUNTER — Emergency Department (HOSPITAL_COMMUNITY): Payer: Medicare Other

## 2018-12-30 DIAGNOSIS — E876 Hypokalemia: Secondary | ICD-10-CM | POA: Diagnosis present

## 2018-12-30 DIAGNOSIS — M810 Age-related osteoporosis without current pathological fracture: Secondary | ICD-10-CM | POA: Diagnosis present

## 2018-12-30 DIAGNOSIS — M24531 Contracture, right wrist: Secondary | ICD-10-CM | POA: Diagnosis present

## 2018-12-30 DIAGNOSIS — K920 Hematemesis: Secondary | ICD-10-CM | POA: Diagnosis not present

## 2018-12-30 DIAGNOSIS — Z79899 Other long term (current) drug therapy: Secondary | ICD-10-CM

## 2018-12-30 DIAGNOSIS — J189 Pneumonia, unspecified organism: Secondary | ICD-10-CM | POA: Diagnosis present

## 2018-12-30 DIAGNOSIS — R29818 Other symptoms and signs involving the nervous system: Secondary | ICD-10-CM | POA: Diagnosis not present

## 2018-12-30 DIAGNOSIS — I10 Essential (primary) hypertension: Secondary | ICD-10-CM | POA: Diagnosis present

## 2018-12-30 DIAGNOSIS — R2990 Unspecified symptoms and signs involving the nervous system: Secondary | ICD-10-CM | POA: Diagnosis not present

## 2018-12-30 DIAGNOSIS — I63233 Cerebral infarction due to unspecified occlusion or stenosis of bilateral carotid arteries: Secondary | ICD-10-CM | POA: Diagnosis not present

## 2018-12-30 DIAGNOSIS — R29810 Facial weakness: Secondary | ICD-10-CM | POA: Diagnosis present

## 2018-12-30 DIAGNOSIS — R200 Anesthesia of skin: Secondary | ICD-10-CM | POA: Diagnosis not present

## 2018-12-30 DIAGNOSIS — E7800 Pure hypercholesterolemia, unspecified: Secondary | ICD-10-CM | POA: Diagnosis present

## 2018-12-30 DIAGNOSIS — R9431 Abnormal electrocardiogram [ECG] [EKG]: Secondary | ICD-10-CM | POA: Diagnosis present

## 2018-12-30 DIAGNOSIS — Z20828 Contact with and (suspected) exposure to other viral communicable diseases: Secondary | ICD-10-CM | POA: Diagnosis present

## 2018-12-30 DIAGNOSIS — Z7901 Long term (current) use of anticoagulants: Secondary | ICD-10-CM | POA: Diagnosis not present

## 2018-12-30 DIAGNOSIS — R0602 Shortness of breath: Secondary | ICD-10-CM

## 2018-12-30 DIAGNOSIS — I69351 Hemiplegia and hemiparesis following cerebral infarction affecting right dominant side: Secondary | ICD-10-CM | POA: Diagnosis not present

## 2018-12-30 DIAGNOSIS — K5730 Diverticulosis of large intestine without perforation or abscess without bleeding: Secondary | ICD-10-CM | POA: Diagnosis not present

## 2018-12-30 DIAGNOSIS — Z66 Do not resuscitate: Secondary | ICD-10-CM | POA: Diagnosis present

## 2018-12-30 DIAGNOSIS — I69398 Other sequelae of cerebral infarction: Secondary | ICD-10-CM | POA: Diagnosis not present

## 2018-12-30 DIAGNOSIS — I119 Hypertensive heart disease without heart failure: Secondary | ICD-10-CM | POA: Diagnosis present

## 2018-12-30 DIAGNOSIS — Z87891 Personal history of nicotine dependence: Secondary | ICD-10-CM

## 2018-12-30 DIAGNOSIS — I6389 Other cerebral infarction: Secondary | ICD-10-CM

## 2018-12-30 DIAGNOSIS — R262 Difficulty in walking, not elsewhere classified: Secondary | ICD-10-CM | POA: Diagnosis present

## 2018-12-30 DIAGNOSIS — I714 Abdominal aortic aneurysm, without rupture: Secondary | ICD-10-CM | POA: Diagnosis not present

## 2018-12-30 DIAGNOSIS — R471 Dysarthria and anarthria: Secondary | ICD-10-CM | POA: Diagnosis present

## 2018-12-30 DIAGNOSIS — R29705 NIHSS score 5: Secondary | ICD-10-CM | POA: Diagnosis present

## 2018-12-30 DIAGNOSIS — I69392 Facial weakness following cerebral infarction: Secondary | ICD-10-CM | POA: Diagnosis not present

## 2018-12-30 DIAGNOSIS — R531 Weakness: Secondary | ICD-10-CM | POA: Diagnosis not present

## 2018-12-30 DIAGNOSIS — I639 Cerebral infarction, unspecified: Secondary | ICD-10-CM | POA: Diagnosis present

## 2018-12-30 DIAGNOSIS — I469 Cardiac arrest, cause unspecified: Secondary | ICD-10-CM | POA: Diagnosis not present

## 2018-12-30 DIAGNOSIS — R1310 Dysphagia, unspecified: Secondary | ICD-10-CM | POA: Diagnosis present

## 2018-12-30 DIAGNOSIS — E785 Hyperlipidemia, unspecified: Secondary | ICD-10-CM | POA: Diagnosis present

## 2018-12-30 DIAGNOSIS — M24521 Contracture, right elbow: Secondary | ICD-10-CM | POA: Diagnosis present

## 2018-12-30 DIAGNOSIS — Z7982 Long term (current) use of aspirin: Secondary | ICD-10-CM

## 2018-12-30 DIAGNOSIS — I491 Atrial premature depolarization: Secondary | ICD-10-CM | POA: Diagnosis not present

## 2018-12-30 DIAGNOSIS — Z833 Family history of diabetes mellitus: Secondary | ICD-10-CM

## 2018-12-30 DIAGNOSIS — I63111 Cerebral infarction due to embolism of right vertebral artery: Secondary | ICD-10-CM

## 2018-12-30 LAB — COMPREHENSIVE METABOLIC PANEL
ALT: 9 U/L (ref 0–44)
AST: 14 U/L — ABNORMAL LOW (ref 15–41)
Albumin: 3.4 g/dL — ABNORMAL LOW (ref 3.5–5.0)
Alkaline Phosphatase: 55 U/L (ref 38–126)
Anion gap: 10 (ref 5–15)
BUN: 15 mg/dL (ref 8–23)
CO2: 22 mmol/L (ref 22–32)
Calcium: 8.9 mg/dL (ref 8.9–10.3)
Chloride: 106 mmol/L (ref 98–111)
Creatinine, Ser: 0.73 mg/dL (ref 0.44–1.00)
GFR calc Af Amer: >60 mL/min (ref >60)
GFR calc non Af Amer: >60 mL/min (ref >60)
Glucose, Bld: 147 mg/dL — ABNORMAL HIGH (ref 70–99)
Potassium: 3.4 mmol/L — ABNORMAL LOW (ref 3.5–5.1)
Sodium: 138 mmol/L (ref 135–145)
Total Bilirubin: 0.5 mg/dL (ref 0.3–1.2)
Total Protein: 6.5 g/dL (ref 6.5–8.1)

## 2018-12-30 LAB — I-STAT CHEM 8, ED
BUN: 16 mg/dL (ref 8–23)
Calcium, Ion: 1.01 mmol/L — ABNORMAL LOW (ref 1.15–1.40)
Chloride: 107 mmol/L (ref 98–111)
Creatinine, Ser: 0.6 mg/dL (ref 0.44–1.00)
Glucose, Bld: 146 mg/dL — ABNORMAL HIGH (ref 70–99)
HCT: 37 % (ref 36.0–46.0)
Hemoglobin: 12.6 g/dL (ref 12.0–15.0)
Potassium: 5.2 mmol/L — ABNORMAL HIGH (ref 3.5–5.1)
Sodium: 136 mmol/L (ref 135–145)
TCO2: 23 mmol/L (ref 22–32)

## 2018-12-30 LAB — BASIC METABOLIC PANEL
Anion gap: 10 (ref 5–15)
BUN: 11 mg/dL (ref 8–23)
CO2: 19 mmol/L — ABNORMAL LOW (ref 22–32)
Calcium: 8.1 mg/dL — ABNORMAL LOW (ref 8.9–10.3)
Chloride: 109 mmol/L (ref 98–111)
Creatinine, Ser: 0.78 mg/dL (ref 0.44–1.00)
GFR calc Af Amer: >60 mL/min (ref >60)
GFR calc non Af Amer: >60 mL/min (ref >60)
Glucose, Bld: 124 mg/dL — ABNORMAL HIGH (ref 70–99)
Potassium: 4.3 mmol/L (ref 3.5–5.1)
Sodium: 138 mmol/L (ref 135–145)

## 2018-12-30 LAB — URINALYSIS, ROUTINE W REFLEX MICROSCOPIC
Bilirubin Urine: NEGATIVE
Glucose, UA: 50 mg/dL — AB
Ketones, ur: 5 mg/dL — AB
Nitrite: NEGATIVE
Protein, ur: NEGATIVE mg/dL
Specific Gravity, Urine: 1.018 (ref 1.005–1.030)
pH: 7 (ref 5.0–8.0)

## 2018-12-30 LAB — LIPID PANEL
Cholesterol: 129 mg/dL (ref 0–200)
HDL: 67 mg/dL (ref >40)
LDL Cholesterol: 54 mg/dL (ref 0–99)
Total CHOL/HDL Ratio: 1.9 RATIO
Triglycerides: 42 mg/dL (ref <150)
VLDL: 8 mg/dL (ref 0–40)

## 2018-12-30 LAB — MAGNESIUM: Magnesium: 1.8 mg/dL (ref 1.7–2.4)

## 2018-12-30 LAB — CBC WITH DIFFERENTIAL/PLATELET
Abs Immature Granulocytes: 0.02 10*3/uL (ref 0.00–0.07)
Abs Immature Granulocytes: 0.09 10*3/uL — ABNORMAL HIGH (ref 0.00–0.07)
Basophils Absolute: 0.1 10*3/uL (ref 0.0–0.1)
Basophils Absolute: 0.1 10*3/uL (ref 0.0–0.1)
Basophils Relative: 1 %
Basophils Relative: 0 %
Eosinophils Absolute: 0.1 10*3/uL (ref 0.0–0.5)
Eosinophils Absolute: 0 10*3/uL (ref 0.0–0.5)
Eosinophils Relative: 1 %
Eosinophils Relative: 0 %
HCT: 39.3 % (ref 36.0–46.0)
HCT: 36.8 % (ref 36.0–46.0)
Hemoglobin: 12.8 g/dL (ref 12.0–15.0)
Hemoglobin: 13.6 g/dL (ref 12.0–15.0)
Immature Granulocytes: 0 %
Immature Granulocytes: 0 %
Lymphocytes Relative: 29 %
Lymphocytes Relative: 2 %
Lymphs Abs: 2.9 10*3/uL (ref 0.7–4.0)
Lymphs Abs: 0.5 10*3/uL — ABNORMAL LOW (ref 0.7–4.0)
MCH: 29.6 pg (ref 26.0–34.0)
MCH: 34.9 pg — ABNORMAL HIGH (ref 26.0–34.0)
MCHC: 32.6 g/dL (ref 30.0–36.0)
MCHC: 37 g/dL — ABNORMAL HIGH (ref 30.0–36.0)
MCV: 91 fL (ref 80.0–100.0)
MCV: 94.4 fL (ref 80.0–100.0)
Monocytes Absolute: 0.6 10*3/uL (ref 0.1–1.0)
Monocytes Absolute: 0.9 10*3/uL (ref 0.1–1.0)
Monocytes Relative: 6 %
Monocytes Relative: 4 %
Neutro Abs: 6.3 10*3/uL (ref 1.7–7.7)
Neutro Abs: 19.1 10*3/uL — ABNORMAL HIGH (ref 1.7–7.7)
Neutrophils Relative %: 63 %
Neutrophils Relative %: 94 %
Platelets: 269 10*3/uL (ref 150–400)
Platelets: 310 10*3/uL (ref 150–400)
RBC: 4.32 MIL/uL (ref 3.87–5.11)
RBC: 3.9 MIL/uL (ref 3.87–5.11)
RDW: 12.8 % (ref 11.5–15.5)
RDW: 14.6 % (ref 11.5–15.5)
WBC: 10 10*3/uL (ref 4.0–10.5)
WBC: 20.6 10*3/uL — ABNORMAL HIGH (ref 4.0–10.5)
nRBC: 0 % (ref 0.0–0.2)
nRBC: 0 % (ref 0.0–0.2)

## 2018-12-30 LAB — MRSA PCR SCREENING: MRSA by PCR: NEGATIVE

## 2018-12-30 LAB — HEMOGLOBIN A1C
Hgb A1c MFr Bld: 5.7 % — ABNORMAL HIGH (ref 4.8–5.6)
Mean Plasma Glucose: 116.89 mg/dL

## 2018-12-30 LAB — RAPID URINE DRUG SCREEN, HOSP PERFORMED
Amphetamines: NOT DETECTED
Barbiturates: NOT DETECTED
Benzodiazepines: NOT DETECTED
Cocaine: NOT DETECTED
Opiates: NOT DETECTED
Tetrahydrocannabinol: NOT DETECTED

## 2018-12-30 LAB — ECHOCARDIOGRAM COMPLETE
Height: 62 in
Weight: 2112.89 oz

## 2018-12-30 LAB — SARS CORONAVIRUS 2 (TAT 6-24 HRS): SARS Coronavirus 2: NEGATIVE

## 2018-12-30 LAB — CBG MONITORING, ED
Glucose-Capillary: 137 mg/dL — ABNORMAL HIGH (ref 70–99)
Glucose-Capillary: 134 mg/dL — ABNORMAL HIGH (ref 70–99)

## 2018-12-30 LAB — PROTIME-INR
INR: 0.9 (ref 0.8–1.2)
Prothrombin Time: 11.9 seconds (ref 11.4–15.2)

## 2018-12-30 MED ORDER — METOCLOPRAMIDE HCL 5 MG/ML IJ SOLN
10.0000 mg | Freq: Once | INTRAMUSCULAR | Status: AC
  Administered 2018-12-30: 10 mg via INTRAVENOUS
  Filled 2018-12-30: qty 2

## 2018-12-30 MED ORDER — SODIUM CHLORIDE 0.9 % IV SOLN
500.0000 mg | INTRAVENOUS | Status: DC
  Administered 2018-12-30: 500 mg via INTRAVENOUS
  Filled 2018-12-30 (×2): qty 500

## 2018-12-30 MED ORDER — ASPIRIN EC 325 MG PO TBEC: 325.0000 mg | DELAYED_RELEASE_TABLET | Freq: Every day | ORAL | Status: DC

## 2018-12-30 MED ORDER — ASPIRIN 325 MG PO TABS: 325.0000 mg | ORAL_TABLET | Freq: Every day | ORAL | Status: DC

## 2018-12-30 MED ORDER — SODIUM CHLORIDE 0.9 % IV SOLN
INTRAVENOUS | Status: DC
  Administered 2018-12-30: 04:00:00 via INTRAVENOUS

## 2018-12-30 MED ORDER — SENNOSIDES-DOCUSATE SODIUM 8.6-50 MG PO TABS: 1.0000 | ORAL_TABLET | Freq: Every evening | ORAL | Status: DC | PRN

## 2018-12-30 MED ORDER — ACETAMINOPHEN 650 MG RE SUPP
650.0000 mg | RECTAL | Status: DC | PRN
  Administered 2018-12-30: 650 mg via RECTAL
  Filled 2018-12-30: qty 1

## 2018-12-30 MED ORDER — ACETAMINOPHEN 325 MG PO TABS: 650.0000 mg | ORAL_TABLET | ORAL | Status: DC | PRN

## 2018-12-30 MED ORDER — PROMETHAZINE HCL 25 MG/ML IJ SOLN
12.5000 mg | Freq: Four times a day (QID) | INTRAMUSCULAR | Status: DC | PRN
  Administered 2018-12-30 (×2): 12.5 mg via INTRAVENOUS
  Filled 2018-12-30 (×2): qty 1

## 2018-12-30 MED ORDER — ENOXAPARIN SODIUM 40 MG/0.4ML ~~LOC~~ SOLN
40.0000 mg | Freq: Every day | SUBCUTANEOUS | Status: DC
  Administered 2018-12-30: 40 mg via SUBCUTANEOUS
  Filled 2018-12-30: qty 0.4

## 2018-12-30 MED ORDER — ACETAMINOPHEN 160 MG/5ML PO SOLN: 650.0000 mg | ORAL | Status: DC | PRN

## 2018-12-30 MED ORDER — ASPIRIN 300 MG RE SUPP
300.0000 mg | Freq: Every day | RECTAL | Status: DC
  Administered 2018-12-31: 03:00:00 300 mg via RECTAL
  Filled 2018-12-30: qty 1

## 2018-12-30 MED ORDER — ONDANSETRON HCL 4 MG/2ML IJ SOLN
4.0000 mg | Freq: Four times a day (QID) | INTRAMUSCULAR | Status: DC | PRN
  Administered 2018-12-30 (×2): 4 mg via INTRAVENOUS
  Filled 2018-12-30 (×2): qty 2

## 2018-12-30 MED ORDER — IOHEXOL 350 MG/ML SOLN
100.0000 mL | Freq: Once | INTRAVENOUS | Status: AC | PRN
  Administered 2018-12-30: 100 mL via INTRAVENOUS

## 2018-12-30 MED ORDER — SODIUM CHLORIDE 0.9% FLUSH
3.0000 mL | Freq: Once | INTRAVENOUS | Status: AC
  Administered 2018-12-30: 10:00:00 3 mL via INTRAVENOUS

## 2018-12-30 MED ORDER — STROKE: EARLY STAGES OF RECOVERY BOOK
Freq: Once | Status: DC
  Filled 2018-12-30: qty 1

## 2018-12-30 NOTE — Consult Note (Signed)
Referring Physician: Dr. Leonides Schanz    Chief Complaint: Left facial droop  HPI: Jane Farley is an 85 y.o. female presenting from her SNF with c/c of left facial droop and "not feeling right". She stated that she felt she was having a stroke. Code Stroke was called in the field. BP was 136/88 en route. She endorses new onset difficulty swallowing.  The patient on arrival could not specify what was new regarding neurological symptoms, stating again "I don't feel right" and "I think I am having a stroke". She has a prior history of stroke with RUE flexion contracture and right facial droop.   LSN: 2300 tPA Given: No: Minor deficits on exam. Risk of hemorrhage outweighs potential therapeutic benefit.  NIHSS: 5 (possibly new components have a score of 2 - dysarthria and sensory impairment).   Past Medical History:  Diagnosis Date  . Hemorrhoids   . Hypercholesterolemia   . Hypertension   . Nephrolithiasis   . Osteoporosis   . Stroke ([place])   . Weakness due to cerebrovascular accident    Residual RT sided weakness from previous CVA    Past Surgical History:  Procedure Laterality Date  . BREAST LUMPECTOMY  02/23/1965   left  . EXPLORATORY LAPAROTOMY WITH ABDOMINAL MASS EXCISION  12/08/2010  . LOOP RECORDER IMPLANT N/A 11/25/2012   Procedure: LOOP RECORDER IMPLANT;  Surgeon: Evans Lance, MD;  Location: [facility] CATH LAB;  Service: Cardiovascular;  Laterality: N/A;  . TEE WITHOUT CARDIOVERSION N/A 11/25/2012   Procedure: TRANSESOPHAGEAL ECHOCARDIOGRAM (TEE);  Surgeon: Thayer Headings, MD;  [facility] ENDOSCOPY;  Service: Cardiovascular;  Laterality: N/A;    Family History  Problem Relation Age of Onset  . Diabetes Father   . Diabetes Brother    Social History:  reports that she quit smoking about 16 years ago. She has never used smokeless tobacco. She reports that she does not drink alcohol or use drugs.  Allergies: No Known Allergies  Home Medications:   Amlodipine ASA Losartan Simvastatin Acetaminophen   ROS: As per HPI. Denies additional complaints on comprehensive ROS, including no CP, headache or SOB.   Physical Examination: Weight 59.9 kg.  HEENT: [place]/AT Lungs: Respirations unlabored Ext: Warm and well perfused  Neurologic Examination: Mental Status: Awake and alert. Fully oriented. Fluent speech. Dysarthria noted.  Cranial Nerves: II:  Visual fields intact bilaterally. PERRL. III,IV, VI: EOMI with saccadic pursuits noted. Right palpebral fissure significantly smaller than left.  V,VII: Right facial droop. Facial temp sensation decreased on the left.   VIII: hearing intact to voice IX,X: No hypophonia.  XI: Shoulder shrug slightly weaker on the right XII: midline tongue extension  Motor: RUE: Flexion contractures at elbow, wrist and digits. 4-/5 strength with bradykinesia RLE: 4/5 LUE and LLE 5/5 Sensory: Temp decreased to LUE and LLE.   Deep Tendon Reflexes:  4+ RUE 3+ LUE 2+ patellae bilaterally 0 achilles bilaterally Plantars: Upgoing bilaterally  Cerebellar: No ataxia with FNF on the left or right (slow on the right) Gait: Deferred  CT head: 1. No acute intracranial infarct or other abnormality. 2. ASPECTS is 10. 3. Advanced age-related cerebral atrophy with chronic microvascular ischemic disease.  Assessment: 85 y.o. female presenting with acute stroke symptoms 1. Components of exam attributable to new stroke consist of dysarthria and left-sided sensory deficits. 2. NIHSS: 5 (new components have a score of 2 - dysarthria and sensory impairment).  3. Given relatively minor new deficits on exam, risk of hemorrhage outweighs potential therapeutic benefit.  4. CT head shows no acute intracranial infarct or other abnormality. Advanced age-related cerebral atrophy with chronic microvascular ischemic disease is noted. 5. Stroke Risk Factors - Hypercholesterolemia, HTN and prior stroke 6. Right sided weakness  and facial droop secondary to prior stroke  Plan: 1. HgbA1c, fasting lipid panel 2. MRI and MRA  of the brain without contrast 3. PT consult, OT consult, Speech consult 4. TTE 5. Carotid dopplers 6. Prophylactic therapy- Continue ASA for now. May need DAPT if MRI brain confirms stroke 7. Risk factor modification 8. Telemetry monitoring 9. Frequent neuro checks 10. Modified permissive HTN protocol given advanced age. Treat SBP if > 180    @Electronically  signed: Dr. Kerney Elbe 01/16/2019, 2:19 AM

## 2018-12-30 NOTE — ED Notes (Signed)
Placed pt on 3.5L 02 per [place]. Sa02 between 90-93%

## 2018-12-30 NOTE — Progress Notes (Signed)
  Echocardiogram 2D Echocardiogram has been performed.  Jane Farley 01/16/2019, 11:20 AM

## 2018-12-30 NOTE — Plan of Care (Signed)
  Problem: Education: Goal: Knowledge of disease or condition will improve Outcome: Progressing Goal: Knowledge of secondary prevention will improve Outcome: Progressing   Problem: Coping: Goal: Will verbalize positive feelings about self Outcome: Progressing   Problem: Self-Care: Goal: Ability to participate in self-care as condition permits will improve Outcome: Progressing Goal: Ability to communicate needs accurately will improve Outcome: Progressing   Problem: Nutrition: Goal: Risk of aspiration will decrease Outcome: Progressing

## 2018-12-30 NOTE — ED Triage Notes (Signed)
Pt coming from [place] with complaints of "feeling funny" on the right side of her face. LKN @ 2300. Hx of stroke, pt has R side baseline deficit in arm and hand. Normally able to walk but said she couldn't this evening. ASA daily, no blood thinner. BP 174/74, all other vitals WDL.

## 2018-12-30 NOTE — ED Notes (Signed)
Bladder scan; 137ml 

## 2018-12-30 NOTE — ED Notes (Signed)
Pt has vomited at least 7 times so far, it's only a small amount, but she keeps vomiting. I notified dr. Starla Link and obtained an order for another nausea medicine.

## 2018-12-30 NOTE — ED Notes (Signed)
ED TO INPATIENT HANDOFF REPORT  ED Nurse Name and Phone #: Lorrin Goodell [phone]  S Name/Age/Gender Jane Farley 85 y.o. female Room/Bed: 031C/031C  Code Status   Code Status: DNR  Home/SNF/Other Nursing Home Patient oriented to: place and time Is this baseline? Yes   Triage Complete: Triage complete  Chief Complaint code stroke  Triage Note Pt coming from [place] with complaints of "feeling funny" on the right side of her face. LKN @ 2300. Hx of stroke, pt has R side baseline deficit in arm and hand. Normally able to walk but said she couldn't this evening. ASA daily, no blood thinner. BP 174/74, all other vitals WDL.    Allergies No Known Allergies  Level of Care/Admitting Diagnosis ED Disposition    ED Disposition Condition [facility] Area: [place] [[id number]]  Level of Care: Telemetry Medical [104]  Covid Evaluation: N/A  Diagnosis: Stroke [facility]) [id number]  Admitting Physician: Aline August [id number]  Attending Physician: Aline August [id number]  Estimated length of stay: 3 - 4 days  Certification:: I certify this patient will need inpatient services for at least 2 midnights  PT Class (Do Not Modify): Inpatient [101]  PT Acc Code (Do Not Modify): Private [1]       B Medical/Surgery History Past Medical History:  Diagnosis Date  . Hemorrhoids   . Hypercholesterolemia   . Hypertension   . Nephrolithiasis   . Osteoporosis   . Stroke ([place])   . Weakness due to cerebrovascular accident    Residual RT sided weakness from previous CVA   Past Surgical History:  Procedure Laterality Date  . BREAST LUMPECTOMY  02/23/1965   left  . EXPLORATORY LAPAROTOMY WITH ABDOMINAL MASS EXCISION  12/08/2010  . LOOP RECORDER IMPLANT N/A 11/25/2012   Procedure: LOOP RECORDER IMPLANT;  Surgeon: Evans Lance, MD;  Location: [facility] CATH LAB;  Service: Cardiovascular;  Laterality: N/A;  . TEE WITHOUT CARDIOVERSION N/A 11/25/2012   Procedure:  TRANSESOPHAGEAL ECHOCARDIOGRAM (TEE);  Surgeon: Thayer Headings, MD;  Location[facility] ENDOSCOPY;  Service: Cardiovascular;  Laterality: N/A;     A IV Location/Drains/Wounds Patient Lines/Drains/Airways Status   Active Line/Drains/Airways    Name:   Placement date:   Placement time:   Site:   Days:   Peripheral IV 12/08/14 Left Forearm   12/08/14    2026    Forearm   1483   Peripheral IV Left Forearm   -    -    Forearm      Wound 11/22/12 Other (Comment)   11/22/12    1845    -   2229          Intake/Output Last 24 hours No intake or output data in the 24 hours ending 01/23/2019 1616  Labs/Imaging Results for orders placed or performed during the hospital encounter of 12/29/2018 (from the past 48 hour(s))  CBG monitoring, ED     Status: Abnormal   Collection Time: 01/13/2019  2:15 AM  Result Value Ref Range   Glucose-Capillary 137 (H) 70 - 99 mg/dL  Comprehensive metabolic panel     Status: Abnormal   Collection Time: 01/21/2019  2:18 AM  Result Value Ref Range   Sodium 138 135 - 145 mmol/L   Potassium 3.4 (L) 3.5 - 5.1 mmol/L   Chloride 106 98 - 111 mmol/L   CO2 22 22 - 32 mmol/L   Glucose, Bld 147 (H) 70 - 99 mg/dL   BUN 15 8 -  23 mg/dL   Creatinine, Ser 0.73 0.44 - 1.00 mg/dL   Calcium 8.9 8.9 - 10.3 mg/dL   Total Protein 6.5 6.5 - 8.1 g/dL   Albumin 3.4 (L) 3.5 - 5.0 g/dL   AST 14 (L) 15 - 41 U/L   ALT 9 0 - 44 U/L   Alkaline Phosphatase 55 38 - 126 U/L   Total Bilirubin 0.5 0.3 - 1.2 mg/dL   GFR calc non Af Amer >60 >60 mL/min   GFR calc Af Amer >60 >60 mL/min   Anion gap 10 5 - 15    Comment: Performed at [facility], [place] [address], [place], [place] [address]  CBC WITH DIFFERENTIAL     Status: None   Collection Time: 01/18/2019  2:18 AM  Result Value Ref Range   WBC 10.0 4.0 - 10.5 K/uL   RBC 4.32 3.87 - 5.11 MIL/uL   Hemoglobin 12.8 12.0 - 15.0 g/dL   HCT 39.3 36.0 - 46.0 %   MCV 91.0 80.0 - 100.0 fL   MCH 29.6 26.0 - 34.0 pg   MCHC 32.6 30.0 - 36.0 g/dL     Comment: REPEATED TO VERIFY   RDW 12.8 11.5 - 15.5 %   Platelets 269 150 - 400 K/uL    Comment: REPEATED TO VERIFY   nRBC 0.0 0.0 - 0.2 %   Neutrophils Relative % 63 %   Neutro Abs 6.3 1.7 - 7.7 K/uL   Lymphocytes Relative 29 %   Lymphs Abs 2.9 0.7 - 4.0 K/uL   Monocytes Relative 6 %   Monocytes Absolute 0.6 0.1 - 1.0 K/uL   Eosinophils Relative 1 %   Eosinophils Absolute 0.1 0.0 - 0.5 K/uL   Basophils Relative 1 %   Basophils Absolute 0.1 0.0 - 0.1 K/uL   Immature Granulocytes 0 %   Abs Immature Granulocytes 0.02 0.00 - 0.07 K/uL    Comment: Performed at [facility] [address], [place], [place] [address]  Protime-INR     Status: None   Collection Time: 12/29/2018  2:18 AM  Result Value Ref Range   Prothrombin Time 11.9 11.4 - 15.2 seconds   INR 0.9 0.8 - 1.2    Comment: (NOTE) INR goal varies based on device and disease states. Performed at [facility], [place] [address], [place], [place] [address]   I-stat chem 8, ED     Status: Abnormal   Collection Time: 01/06/2019  2:29 AM  Result Value Ref Range   Sodium 136 135 - 145 mmol/L   Potassium 5.2 (H) 3.5 - 5.1 mmol/L   Chloride 107 98 - 111 mmol/L   BUN 16 8 - 23 mg/dL   Creatinine, Ser 0.60 0.44 - 1.00 mg/dL   Glucose, Bld 146 (H) 70 - 99 mg/dL   Calcium, Ion 1.01 (L) 1.15 - 1.40 mmol/L   TCO2 23 22 - 32 mmol/L   Hemoglobin 12.6 12.0 - 15.0 g/dL   HCT 37.0 36.0 - 46.0 %  CBG monitoring, ED     Status: Abnormal   Collection Time: 01/05/2019  2:42 AM  Result Value Ref Range   Glucose-Capillary 134 (H) 70 - 99 mg/dL  SARS CORONAVIRUS 2 (TAT 6-24 HRS) Nasopharyngeal Nasopharyngeal Swab     Status: None   Collection Time: 12/28/2018  3:51 AM   Specimen: Nasopharyngeal Swab  Result Value Ref Range   SARS Coronavirus 2 NEGATIVE NEGATIVE    Comment: (NOTE) SARS-CoV-2 target nucleic acids  are NOT DETECTED. The SARS-CoV-2 RNA is generally detectable in upper and lower respiratory specimens during the acute phase of  infection. Negative results do not preclude SARS-CoV-2 infection, do not rule out co-infections with other pathogens, and should not be used as the sole basis for treatment or other patient management decisions. Negative results must be combined with clinical observations, patient history, and epidemiological information. The expected result is Negative. Fact Sheet for Patients: SugarRoll.be Fact Sheet for Healthcare Providers: https://www.woods-mathews.com/ This test is not yet approved or cleared by the [place] FDA and  has been authorized for detection and/or diagnosis of SARS-CoV-2 by FDA under an Emergency Use Authorization (EUA). This EUA will remain  in effect (meaning this test can be used) for the duration of the COVID-19 declaration under Section 56 4(b)(1) of the Act, 21 U.S.C. section 360bbb-3(b)(1), unless the authorization is terminated or revoked sooner. Performed at [facility], [place] [address] [place], [place] [address]   Magnesium     Status: None   Collection Time: 01/04/2019  7:00 AM  Result Value Ref Range   Magnesium 1.8 1.7 - 2.4 mg/dL    Comment: Performed at [facility] [address] [place], [place] [address]  Hemoglobin A1c     Status: Abnormal   Collection Time: 01/22/2019  7:00 AM  Result Value Ref Range   Hgb A1c MFr Bld 5.7 (H) 4.8 - 5.6 %    Comment: (NOTE) Pre diabetes:          5.7%-6.4% Diabetes:              >6.4% Glycemic control for   <7.0% adults with diabetes    Mean Plasma Glucose 116.89 mg/dL    Comment: Performed at [facility] [address], [place], [place] [id number]  Basic metabolic panel     Status: Abnormal   Collection Time: 01/15/2019  7:00 AM  Result Value Ref Range   Sodium 138 135 - 145 mmol/L   Potassium 4.3 3.5 - 5.1 mmol/L   Chloride 109 98 - 111 mmol/L   CO2 19 (L) 22 - 32 mmol/L   Glucose, Bld 124 (H) 70 - 99 mg/dL   BUN 11 8 - 23 mg/dL    Creatinine, Ser 0.78 0.44 - 1.00 mg/dL   Calcium 8.1 (L) 8.9 - 10.3 mg/dL   GFR calc non Af Amer >60 >60 mL/min   GFR calc Af Amer >60 >60 mL/min   Anion gap 10 5 - 15    Comment: Performed at [facility], [place] [address], [place],  [address]  Lipid panel     Status: None   Collection Time: 01/07/2019  7:00 AM  Result Value Ref Range   Cholesterol 129 0 - 200 mg/dL   Triglycerides 42 <150 mg/dL   HDL 67 >40 mg/dL   Total CHOL/HDL Ratio 1.9 RATIO   VLDL 8 0 - 40 mg/dL   LDL Cholesterol 54 0 - 99 mg/dL    Comment:        Total Cholesterol/HDL:CHD Risk Coronary Heart Disease Risk Table                     Men   Women  1/2 Average Risk   3.4   3.3  Average Risk       5.0   4.4  2 X Average Risk   9.6   7.1  3 X Average Risk  23.4  11.0        Use the calculated Patient Ratio above and the CHD Risk Table to determine the patient's CHD Risk.        ATP III CLASSIFICATION (LDL):  <100     mg/dL   Optimal  100-129  mg/dL   Near or Above                    Optimal  130-159  mg/dL   Borderline  160-189  mg/dL   High  >190     mg/dL   Very High Performed at [facility] [address], [place], [place] [address]   Urine rapid drug screen (hosp performed)     Status: None   Collection Time: 01/16/2019 12:12 PM  Result Value Ref Range   Opiates NONE DETECTED NONE DETECTED   Cocaine NONE DETECTED NONE DETECTED   Benzodiazepines NONE DETECTED NONE DETECTED   Amphetamines NONE DETECTED NONE DETECTED   Tetrahydrocannabinol NONE DETECTED NONE DETECTED   Barbiturates NONE DETECTED NONE DETECTED    Comment: (NOTE) DRUG SCREEN FOR MEDICAL PURPOSES ONLY.  IF CONFIRMATION IS NEEDED FOR ANY PURPOSE, NOTIFY LAB WITHIN 5 DAYS. LOWEST DETECTABLE LIMITS FOR URINE DRUG SCREEN Drug Class                     Cutoff (ng/mL) Amphetamine and metabolites    1000 Barbiturate and metabolites    200 Benzodiazepine                 [id number] Tricyclics and metabolites     300 Opiates and  metabolites        300 Cocaine and metabolites        300 THC                            50 Performed at [facility], [place] [address], [place], [place] [address]   Urinalysis, Routine w reflex microscopic     Status: Abnormal   Collection Time: 01/15/2019 12:12 PM  Result Value Ref Range   Color, Urine YELLOW YELLOW   APPearance CLOUDY (A) CLEAR   Specific Gravity, Urine 1.018 1.005 - 1.030   pH 7.0 5.0 - 8.0   Glucose, UA 50 (A) NEGATIVE mg/dL   Hgb urine dipstick MODERATE (A) NEGATIVE   Bilirubin Urine NEGATIVE NEGATIVE   Ketones, ur 5 (A) NEGATIVE mg/dL   Protein, ur NEGATIVE NEGATIVE mg/dL   Nitrite NEGATIVE NEGATIVE   Leukocytes,Ua SMALL (A) NEGATIVE   RBC / HPF 21-50 0 - 5 RBC/hpf   WBC, UA 11-20 0 - 5 WBC/hpf   Bacteria, UA RARE (A) NONE SEEN   Squamous Epithelial / LPF 0-5 0 - 5   Amorphous Crystal PRESENT     Comment: Performed at [facility], [place] [address], [place], [place] [address]   Ct Angio Head W Or Wo Contrast  Result Date: 01/12/2019 CLINICAL DATA:  Stroke, follow-up EXAM: CT ANGIOGRAPHY HEAD AND NECK TECHNIQUE: Multidetector CT imaging of the head and neck was performed using the standard protocol during bolus administration of intravenous contrast. Multiplanar CT image reconstructions and MIPs were obtained to evaluate the vascular anatomy. Carotid stenosis measurements (when applicable) are obtained utilizing NASCET criteria, using the distal internal carotid diameter as the denominator. CONTRAST:  190mL OMNIPAQUE IOHEXOL 350 MG/ML SOLN COMPARISON:  CTA 10/12/2012 correlation made with MRI December 30 2018 FINDINGS: CTA  NECK FINDINGS Aortic arch: Moderate calcified and noncalcified plaque the arch. There is a focal area of plaque ulceration on series 5, image 136. Great vessel origins are patent. Right carotid system: Common, internal, and external arteries are patent. There is mild multifocal calcified plaque along the common carotid. Calcified plaque is  also present at the bifurcation and along the proximal ICA with minimal measurable stenosis. There is some tortuosity of the distal cervical ICA. Left carotid system: Common, internal, and external arteries are patent. There is mild multifocal calcified plaque along the common carotid. Calcified plaque is also present at the bifurcation and along the proximal ICA with minimal measurable stenosis. Vertebral arteries: Patent. Left vertebral artery is dominant. No significant stenosis or evidence of dissection. Skeleton: Degenerative changes of the cervical spine. Other neck: No neck mass or adenopathy. Upper chest: No apical lung mass. Review of the MIP images confirms the above findings CTA HEAD FINDINGS Anterior circulation: Intracranial internal carotid arteries are patent with mild calcified plaque along the cavernous and paraclinoid portions. Anterior and middle cerebral arteries are patent. Posterior circulation: Intracranial vertebral arteries are patent. The nondominant right vertebral artery terminates as a PICA. Basilar artery is patent and terminates as the left posterior cerebral artery. There is fetal origin of the right posterior cerebral artery. Venous sinuses: Not well evaluated due to contrast timing. Anatomic variants: As described previously. Otherwise none significant. Review of the MIP images confirms the above findings IMPRESSION: No large vessel occlusion or significant stenosis. Similar appearance to 2014 study. Electronically Signed   By: Macy Mis M.D.   On: 01/05/2019 13:20   Ct Angio Neck W Or Wo Contrast  Result Date: 12/29/2018 CLINICAL DATA:  Stroke, follow-up EXAM: CT ANGIOGRAPHY HEAD AND NECK TECHNIQUE: Multidetector CT imaging of the head and neck was performed using the standard protocol during bolus administration of intravenous contrast. Multiplanar CT image reconstructions and MIPs were obtained to evaluate the vascular anatomy. Carotid stenosis measurements (when  applicable) are obtained utilizing NASCET criteria, using the distal internal carotid diameter as the denominator. CONTRAST:  182mL OMNIPAQUE IOHEXOL 350 MG/ML SOLN COMPARISON:  CTA 10/12/2012 correlation made with MRI December 30 2018 FINDINGS: CTA NECK FINDINGS Aortic arch: Moderate calcified and noncalcified plaque the arch. There is a focal area of plaque ulceration on series 5, image 136. Great vessel origins are patent. Right carotid system: Common, internal, and external arteries are patent. There is mild multifocal calcified plaque along the common carotid. Calcified plaque is also present at the bifurcation and along the proximal ICA with minimal measurable stenosis. There is some tortuosity of the distal cervical ICA. Left carotid system: Common, internal, and external arteries are patent. There is mild multifocal calcified plaque along the common carotid. Calcified plaque is also present at the bifurcation and along the proximal ICA with minimal measurable stenosis. Vertebral arteries: Patent. Left vertebral artery is dominant. No significant stenosis or evidence of dissection. Skeleton: Degenerative changes of the cervical spine. Other neck: No neck mass or adenopathy. Upper chest: No apical lung mass. Review of the MIP images confirms the above findings CTA HEAD FINDINGS Anterior circulation: Intracranial internal carotid arteries are patent with mild calcified plaque along the cavernous and paraclinoid portions. Anterior and middle cerebral arteries are patent. Posterior circulation: Intracranial vertebral arteries are patent. The nondominant right vertebral artery terminates as a PICA. Basilar artery is patent and terminates as the left posterior cerebral artery. There is fetal origin of the right posterior cerebral artery. Venous sinuses: Not  well evaluated due to contrast timing. Anatomic variants: As described previously. Otherwise none significant. Review of the MIP images confirms the above  findings IMPRESSION: No large vessel occlusion or significant stenosis. Similar appearance to 2014 study. Electronically Signed   By: Macy Mis M.D.   On: 01/20/2019 13:20   Mr Angio Head Wo Contrast  Result Date: 01/12/2019 CLINICAL DATA:  Acute on chronic stroke symptoms. Right facial droop EXAM: MRI HEAD WITHOUT CONTRAST MRA HEAD WITHOUT CONTRAST TECHNIQUE: Multiplanar, multiecho pulse sequences of the brain and surrounding structures were obtained without intravenous contrast. Angiographic images of the head were obtained using MRA technique without contrast. COMPARISON:  11/22/2012 FINDINGS: MRI HEAD FINDINGS Brain: Suspected acute lateral medullary infarct on the right, but not seen on coronal diffusion acquisition (which could be due to slice spacing). Remote left pontine infarct with volume loss. Remote left more than right small cerebellar infarcts. Chronic small vessel ischemic gliosis in the cerebral white matter with chronic lacune at the left corona radiata and bilateral thalamus. Dilated perivascular spaces at the deep gray nuclei. Remote micro hemorrhages in the deep brain, likely hypertensive. Small remote left occipital cortex infarct, see coronal T2 weighted imaging Mild for age cerebral volume loss without specific pattern Vascular: Arterial findings below. Normal dural venous sinus flow voids Skull and upper cervical spine: No focal marrow lesion Sinuses/Orbits: Bilateral cataract resection MRA HEAD FINDINGS Left dominant vertebral artery. No visible right PICA, there is a patent right AICA. The left vertebral and basilar arteries are widely patent. Hypoplastic right P1 segment. Symmetric flow in the proximal posterior cerebral arteries with no flow limiting stenosis. A left PCA branch does appear stenotic with progression from 2014. Robust flow in anterior circulation branches. IMPRESSION: Brain MRI: 1. Equivocal for small acute infarct in the lateral right medulla, suggested on axial  diffusion but not on coronal diffusion. 2. Chronic small vessel disease with multiple chronic small infarcts including in the left pons. Intracranial MRA: No significant change since 2014. No emergent finding or proximal flow limiting stenosis. Electronically Signed   By: Monte Fantasia M.D.   On: 01/17/2019 06:42   Mr Brain Wo Contrast  Result Date: 01/16/2019 CLINICAL DATA:  Acute on chronic stroke symptoms. Right facial droop EXAM: MRI HEAD WITHOUT CONTRAST MRA HEAD WITHOUT CONTRAST TECHNIQUE: Multiplanar, multiecho pulse sequences of the brain and surrounding structures were obtained without intravenous contrast. Angiographic images of the head were obtained using MRA technique without contrast. COMPARISON:  11/22/2012 FINDINGS: MRI HEAD FINDINGS Brain: Suspected acute lateral medullary infarct on the right, but not seen on coronal diffusion acquisition (which could be due to slice spacing). Remote left pontine infarct with volume loss. Remote left more than right small cerebellar infarcts. Chronic small vessel ischemic gliosis in the cerebral white matter with chronic lacune at the left corona radiata and bilateral thalamus. Dilated perivascular spaces at the deep gray nuclei. Remote micro hemorrhages in the deep brain, likely hypertensive. Small remote left occipital cortex infarct, see coronal T2 weighted imaging Mild for age cerebral volume loss without specific pattern Vascular: Arterial findings below. Normal dural venous sinus flow voids Skull and upper cervical spine: No focal marrow lesion Sinuses/Orbits: Bilateral cataract resection MRA HEAD FINDINGS Left dominant vertebral artery. No visible right PICA, there is a patent right AICA. The left vertebral and basilar arteries are widely patent. Hypoplastic right P1 segment. Symmetric flow in the proximal posterior cerebral arteries with no flow limiting stenosis. A left PCA branch does appear stenotic with progression  from 2014. Robust flow in  anterior circulation branches. IMPRESSION: Brain MRI: 1. Equivocal for small acute infarct in the lateral right medulla, suggested on axial diffusion but not on coronal diffusion. 2. Chronic small vessel disease with multiple chronic small infarcts including in the left pons. Intracranial MRA: No significant change since 2014. No emergent finding or proximal flow limiting stenosis. Electronically Signed   By: Monte Fantasia M.D.   On: 12/26/2018 06:42   Ct Head Code Stroke Wo Contrast  Result Date: 01/06/2019 CLINICAL DATA:  Code stroke. Initial evaluation for acute right-sided facial droop. EXAM: CT HEAD WITHOUT CONTRAST TECHNIQUE: Contiguous axial images were obtained from the base of the skull through the vertex without intravenous contrast. COMPARISON:  Prior head CT from 12/08/2014. FINDINGS: Brain: Advanced age-related cerebral atrophy. Confluent and patchy hypodensity within the periventricular and deep white matter both cerebral hemispheres most consistent with chronic microvascular ischemic disease, moderate to advanced in nature. Multiple scattered remote lacunar infarcts seen involving the bilateral basal ganglia, thalami, and pons. No acute intracranial hemorrhage. No acute large vessel territory infarct. No mass lesion, midline shift or mass effect. No hydrocephalus. No extra-axial fluid collection. Vascular: No hyperdense vessel. Calcified atherosclerosis at the skull base. Skull: Scalp soft tissues and calvarium within normal limits. Sinuses/Orbits: Globes and orbital soft tissues demonstrate no acute finding. Left maxillary sinus retention cyst. Paranasal sinuses are otherwise clear. No mastoid effusion. Other: None. ASPECTS [facility] Stroke Program Early CT Score) - Ganglionic level infarction (caudate, lentiform nuclei, internal capsule, insula, M1-M3 cortex): 7 - Supraganglionic infarction (M4-M6 cortex): 3 Total score (0-10 with 10 being normal): 10 IMPRESSION: 1. No acute intracranial  infarct or other abnormality. 2. ASPECTS is 10. 3. Advanced age-related cerebral atrophy with chronic microvascular ischemic disease. These results were communicated to Dr. Cheral Marker at 2:37 amon 11/18/2020by text page via the [facility] messaging system. Electronically Signed   By: Jeannine Boga M.D.   On: 12/29/2018 02:39    Pending Labs Unresulted Labs (From admission, onward)    Start     Ordered   January 16, 2019 0500  CBC with Differential/Platelet  Tomorrow morning,   R     01/16/2019 1309   2019/01/16 [id number]  Basic metabolic panel  Tomorrow morning,   R     01/18/2019 1309   2019/01/16 0500  Magnesium  Tomorrow morning,   R     01/10/2019 1309          Vitals/Pain Today's Vitals   01/09/2019 1331 01/08/2019 1332 01/13/2019 1333 12/26/2018 1400  BP:    (!) 94/56  Pulse: 82 79 81 73  Resp: (!) 21 19 (!) 29 17  Temp:      TempSrc:      SpO2: 90% (!) 88% (!) 88% 94%  Weight:      Height:      PainSc:        Isolation Precautions No active isolations  Medications Medications   stroke: mapping our early stages of recovery book (has no administration in time range)  0.9 %  sodium chloride infusion ( Intravenous New Bag/Given 01/03/2019 0415)  acetaminophen (TYLENOL) tablet 650 mg ( Oral See Alternative 01/21/2019 0820)    Or  acetaminophen (TYLENOL) 160 MG/5ML solution 650 mg ( Per Tube See Alternative 12/30/18 0820)    Or  acetaminophen (TYLENOL) suppository 650 mg (650 mg Rectal Given 12/30/18 0820)  senna-docusate (Senokot-S) tablet 1 tablet (has no administration in time range)  enoxaparin (LOVENOX) injection 40 mg (40 mg Subcutaneous  Given 01/09/2019 1206)  aspirin tablet 325 mg (325 mg Oral Not Given 01/13/2019 0421)  ondansetron (ZOFRAN) injection 4 mg (4 mg Intravenous Given 01/21/2019 0942)  promethazine (PHENERGAN) injection 12.5 mg (12.5 mg Intravenous Given 01/10/2019 1358)  sodium chloride flush (NS) 0.9 % injection 3 mL (3 mLs Intravenous Given 12/28/2018 0948)  iohexol (OMNIPAQUE) 350 MG/ML injection  100 mL (100 mLs Intravenous Contrast Given 01/18/2019 1256)    Mobility walks with device Moderate fall risk   Focused Assessments Neuro Assessment Handoff:  Swallow screen pass? No    NIH Stroke Scale ( + Modified Stroke Scale Criteria)  Interval: Shift assessment Level of Consciousness (1a.)   : Alert, keenly responsive LOC Questions (1b. )   +: Answers one question correctly LOC Commands (1c. )   + : Performs both tasks correctly Best Gaze (2. )  +: Normal Visual (3. )  +: No visual loss Facial Palsy (4. )    : Partial paralysis  Motor Arm, Left (5a. )   +: No drift Motor Arm, Right (5b. )   +: Drift Motor Leg, Left (6a. )   +: No drift Motor Leg, Right (6b. )   +: No drift Limb Ataxia (7. ): Absent Sensory (8. )   +: Mild-to-moderate sensory loss, patient feels pinprick is less sharp or is dull on the affected side, or there is a loss of superficial pain with pinprick, but patient is aware of being touched Best Language (9. )   +: No aphasia Dysarthria (10. ): Normal Extinction/Inattention (11.)   +: No Abnormality Modified SS Total  +: 3 Complete NIHSS TOTAL: 3 Last date known well: 12/29/18 Last time known well: 2300 Neuro Assessment: Within Defined Limits Neuro Checks:   Initial (01/14/2019 0213)  Last Documented NIHSS Modified Score: 3 (12/28/2018 1330) Has TPA been given? No If patient is a Neuro Trauma and patient is going to OR before floor call report to [place] nurse: [phone] or [phone]     R Recommendations: See Admitting Provider Note  Report given to:   Additional Notes:

## 2018-12-30 NOTE — ED Notes (Signed)
Pt failed stroke swallow screen. MD advised

## 2018-12-30 NOTE — Progress Notes (Signed)
PT Cancellation Note  Patient Details Name: EREKA DURAN MRN: [id number] DOB: July 28, 1932   Cancelled Treatment:    Reason Eval/Treat Not Completed: Medical issues which prohibited therapy; patient with nausea and vomiting.  Will attempt to see later as time permits.    Reginia Naas 01/11/2019, 12:15 PM  Magda Kiel, [place] [phone] 01/11/2019

## 2018-12-30 NOTE — ED Notes (Signed)
PAGED ADMITTING PER RN  

## 2018-12-30 NOTE — ED Notes (Signed)
Pt transported to MRI 

## 2018-12-30 NOTE — H&P (Signed)
History and Physical    Jane Farley [id number] DOB: 02/04/1933 DOA: 12/29/2018  PCP: Lujean Amel, MD Patient coming from: Nursing Home  Chief Complaint: "I had a stroke"  HPI: Jane Farley is a 85 y.o. female with medical history significant of CVA with right-sided weakness, right upper extremity flexion contracture, and right facial droop, hypertension, hyperlipidemia presenting as a code stroke from her nursing home.  Patient is not able to specify which new neurologic symptoms she experienced.  States "I had a stroke."  No additional history could be obtained from her.  ED Course: Head CT negative for acute intracranial abnormality.  Neurology consulted.  Review of Systems:  All systems reviewed and apart from history of presenting illness, are negative.  Past Medical History:  Diagnosis Date   Hemorrhoids    Hypercholesterolemia    Hypertension    Nephrolithiasis    Osteoporosis    Stroke ([place])    Weakness due to cerebrovascular accident    Residual RT sided weakness from previous CVA    Past Surgical History:  Procedure Laterality Date   BREAST LUMPECTOMY  02/23/1965   left   EXPLORATORY LAPAROTOMY WITH ABDOMINAL MASS EXCISION  12/08/2010   LOOP RECORDER IMPLANT N/A 11/25/2012   Procedure: LOOP RECORDER IMPLANT;  Surgeon: Evans Lance, MD;  Location: [facility] CATH LAB;  Service: Cardiovascular;  Laterality: N/A;   TEE WITHOUT CARDIOVERSION N/A 11/25/2012   Procedure: TRANSESOPHAGEAL ECHOCARDIOGRAM (TEE);  Surgeon: Thayer Headings, MD;  Location: [facility] ENDOSCOPY;  Service: Cardiovascular;  Laterality: N/A;     reports that she quit smoking about 16 years ago. She has never used smokeless tobacco. She reports that she does not drink alcohol or use drugs.  No Known Allergies  Family History  Problem Relation Age of Onset   Diabetes Father    Diabetes Brother     Prior to Admission medications   Medication Sig Start Date End Date Taking? Authorizing  Provider  amLODipine (NORVASC) 10 MG tablet Take 10 mg by mouth daily.    [provider]  apixaban (ELIQUIS) 5 MG TABS tablet Take 1 tablet (5 mg total) by mouth 2 (two) times daily. Patient not taking: Reported on 06/02/2018 09/16/15   Evans Lance, MD  calcium carbonate (OS-CAL) 600 MG TABS Take 600 mg by mouth daily.     [provider]  HYDROcodone-acetaminophen (NORCO/VICODIN) 5-325 MG tablet Take 1 tablet by mouth every 6 (six) hours as needed for severe pain. 12/10/14   Elgergawy, Silver Huguenin, MD  losartan (COZAAR) 100 MG tablet Take 100 mg by mouth daily.      [provider]  mirtazapine (REMERON) 30 MG tablet Take 30 mg by mouth at bedtime. 10/23/14   [provider]  simvastatin (ZOCOR) 40 MG tablet Take 40 mg by mouth daily.    [provider]  Zoledronic Acid (RECLAST IV) Inject into the vein. Once a year     [provider]    Physical Exam: Vitals:   01/09/2019 0245 01/15/2019 0250 12/28/2018 0254 01/02/2019 0300  BP: 127/63   127/77  Pulse: 64   69  Resp: (!) 21   (!) 23  Temp:   (!) 97.5 F (36.4 C)   TempSrc:   Oral   SpO2: 96%   97%  Weight:  59.9 kg    Height:  5\' 2"  (1.575 m)      Physical Exam  Constitutional: She is oriented to person, place, and time. She  appears well-developed and well-nourished. No distress.  HENT:  Head: Normocephalic.  Mouth/Throat: Oropharynx is clear and moist.  Eyes: Pupils are equal, round, and reactive to light. EOM are normal.  Neck: Neck supple.  Cardiovascular: Normal rate, regular rhythm and intact distal pulses.  Pulmonary/Chest: Effort normal and breath sounds normal. No respiratory distress. She has no wheezes. She has no rales.  Abdominal: Soft. Bowel sounds are normal. She exhibits no distension. There is no abdominal tenderness. There is no guarding.  Musculoskeletal:        General: No edema.  Neurological: She is alert and oriented to person, place, and time.  Right facial  droop Strength 5 out of 5 in bilateral upper and lower extremities. Sensation to light touch intact throughout.  Skin: Skin is warm and dry. She is not diaphoretic.     Labs on Admission: I have personally reviewed following labs and imaging studies  CBC: Recent Labs  Lab 01/05/2019 0229  HGB 12.6  HCT 99991111   Basic Metabolic Panel: Recent Labs  Lab 12/26/2018 0218 01/22/2019 0229  NA 138 136  K 3.4* 5.2*  CL 106 107  CO2 22  --   GLUCOSE 147* 146*  BUN 15 16  CREATININE 0.73 0.60  CALCIUM 8.9  --    GFR: Estimated Creatinine Clearance: 42.1 mL/min (by C-G formula based on SCr of 0.6 mg/dL). Liver Function Tests: Recent Labs  Lab 01/22/2019 0218  AST 14*  ALT 9  ALKPHOS 55  BILITOT 0.5  PROT 6.5  ALBUMIN 3.4*   No results for input(s): LIPASE, AMYLASE in the last 168 hours. No results for input(s): AMMONIA in the last 168 hours. Coagulation Profile: Recent Labs  Lab 12/29/2018 0218  INR 0.9   Cardiac Enzymes: No results for input(s): CKTOTAL, CKMB, CKMBINDEX, TROPONINI in the last 168 hours. BNP (last 3 results) No results for input(s): PROBNP in the last 8760 hours. HbA1C: No results for input(s): HGBA1C in the last 72 hours. CBG: Recent Labs  Lab 01/03/2019 0242  GLUCAP 134*   Lipid Profile: No results for input(s): CHOL, HDL, LDLCALC, TRIG, CHOLHDL, LDLDIRECT in the last 72 hours. Thyroid Function Tests: No results for input(s): TSH, T4TOTAL, FREET4, T3FREE, THYROIDAB in the last 72 hours. Anemia Panel: No results for input(s): VITAMINB12, FOLATE, FERRITIN, TIBC, IRON, RETICCTPCT in the last 72 hours. Urine analysis:    Component Value Date/Time   COLORURINE YELLOW 12/09/2014 0115   APPEARANCEUR CLEAR 12/09/2014 0115   LABSPEC 1.023 12/09/2014 0115   PHURINE 5.5 12/09/2014 0115   GLUCOSEU NEGATIVE 12/09/2014 0115   HGBUR LARGE (A) 12/09/2014 0115   BILIRUBINUR NEGATIVE 12/09/2014 0115   KETONESUR NEGATIVE 12/09/2014 0115   PROTEINUR NEGATIVE  12/09/2014 0115   UROBILINOGEN 0.2 12/09/2014 0115   NITRITE NEGATIVE 12/09/2014 0115   LEUKOCYTESUR TRACE (A) 12/09/2014 0115    Radiological Exams on Admission: Ct Head Code Stroke Wo Contrast  Result Date: 01/16/2019 CLINICAL DATA:  Code stroke. Initial evaluation for acute right-sided facial droop. EXAM: CT HEAD WITHOUT CONTRAST TECHNIQUE: Contiguous axial images were obtained from the base of the skull through the vertex without intravenous contrast. COMPARISON:  Prior head CT from 12/08/2014. FINDINGS: Brain: Advanced age-related cerebral atrophy. Confluent and patchy hypodensity within the periventricular and deep white matter both cerebral hemispheres most consistent with chronic microvascular ischemic disease, moderate to advanced in nature. Multiple scattered remote lacunar infarcts seen involving the bilateral basal ganglia, thalami, and pons. No acute intracranial hemorrhage. No acute large vessel territory  infarct. No mass lesion, midline shift or mass effect. No hydrocephalus. No extra-axial fluid collection. Vascular: No hyperdense vessel. Calcified atherosclerosis at the skull base. Skull: Scalp soft tissues and calvarium within normal limits. Sinuses/Orbits: Globes and orbital soft tissues demonstrate no acute finding. Left maxillary sinus retention cyst. Paranasal sinuses are otherwise clear. No mastoid effusion. Other: None. ASPECTS [facility] Stroke Program Early CT Score) - Ganglionic level infarction (caudate, lentiform nuclei, internal capsule, insula, M1-M3 cortex): 7 - Supraganglionic infarction (M4-M6 cortex): 3 Total score (0-10 with 10 being normal): 10 IMPRESSION: 1. No acute intracranial infarct or other abnormality. 2. ASPECTS is 10. 3. Advanced age-related cerebral atrophy with chronic microvascular ischemic disease. These results were communicated to Dr. Cheral Marker at 2:37 amon 11/09/2020by text page via the [facility] messaging system. Electronically Signed   By: Jeannine Boga  M.D.   On: 01/01/2019 02:39    EKG: Independently reviewed.  Sinus rhythm.  Initial EKG with QTC 654, repeat EKG with QTC 462.  Assessment/Plan Principal Problem:   Stroke-like symptoms Active Problems:   HTN (hypertension)   Acute stroke symptoms History of prior stroke with right-sided weakness, right upper extremity flexion contracture, and right facial droop.  At the time of my evaluation patient was not able to specify what new neurologic symptoms she experienced.  Neurology noted dysarthria and left-sided deficits.  Given relatively minor new deficits, decision was made not to give TPA as risk of hemorrhage outweighs potential therapeutic benefit.  CT head showing no acute intracranial abnormality. -Telemetry monitoring -Allow for permissive hypertension -MRI and MRA of brain without contrast -2D echocardiogram -Hemoglobin A1c, fasting lipid panel -Neurology recommending continuing aspirin for now.  May need dual antiplatelet therapy if MRI confirms stroke. -Frequent neurochecks -PT, OT, speech therapy. -N.p.o. until cleared by bedside swallow evaluation or formal speech evaluation  ?Hypokalemia Initial labs with potassium 3.4, repeat labs showing potassium 5.2.  Patient did not receive potassium supplementation in the ED.  Initial EKG with QTC 654, repeat EKG with QTC 462. -Cardiac monitoring -Repeat BMP -Check magnesium level  Hypertension -Allow permissive hypertension at this time  Pharmacy med rec pending.  DVT prophylaxis: Lovenox Code Status: DNR.  Signed form at bedside. Family Communication: No family available. Disposition Plan: Anticipate discharge after clinical improvement. Consults called: Neurology Admission status: It is my clinical opinion that referral for OBSERVATION is reasonable and necessary in this patient based on the above information provided. The aforementioned taken together are felt to place the patient at high risk for further clinical  deterioration. However it is anticipated that the patient may be medically stable for discharge from the hospital within 24 to 48 hours.  The medical decision making on this patient was of high complexity and the patient is at high risk for clinical deterioration, therefore this is a level 3 visit.  Shela Leff MD [facility] Pager [phone]  If 7PM-7AM, please contact night-coverage [email or web address] Password [email or web address]  12/30/2018, 3:58 AM

## 2018-12-30 NOTE — ED Notes (Signed)
Report given to Martha Lake, RN on 5W

## 2018-12-30 NOTE — ED Notes (Signed)
Rose  [address] sister

## 2018-12-30 NOTE — Progress Notes (Signed)
Patient continue to complain of nausea and had an episode of emesis. The content is brown dark. At this time Zofran is not effective and it is not time for Phenergan. MD was informed. Will continue to monitor.

## 2018-12-30 NOTE — ED Notes (Signed)
Pt is NSR on monitor 

## 2018-12-30 NOTE — ED Notes (Signed)
Patient transported to CT 

## 2018-12-30 NOTE — Progress Notes (Signed)
Patient ID: Jane Farley, female   DOB: Sep 01, 1932, 85 y.o.   MRN: [id number] Patient admitted early this morning for strokelike symptoms.  Neurology was consulted.  Patient seen and examined at bedside.  I have reviewed patient's medical records including this morning's H&P myself.  MRI of the brain shows acute stroke.  PT/SLP/OT eval.  Repeat a.m. labs

## 2018-12-30 NOTE — Progress Notes (Addendum)
Updated Jane Farley at bedside of patient increase from 4LNC-5LNC, patient denies SOB. Patient vomited 45ml of dark brown/dark red on day shift. Will continue to monitor.

## 2018-12-30 NOTE — Progress Notes (Signed)
Patient trasfered from ED to 5W20 via stretcher; alert and oriented x 3; confused; no complaints of pain; complaining of nausea (patient received Zofran per SO);  IV saline locked in LFA running fluids @ 100 ml/hr; skin intact, dry; discoloration BLE; bottom red, blanchable; tele box 26 placed per MD order. Orient patient to room and unit; gave patient care guide; instructed how to use the call bell and  fall risk precautions. Will continue to monitor the patient.

## 2018-12-30 NOTE — Progress Notes (Signed)
STROKE TEAM PROGRESS NOTE   INTERVAL HISTORY Pt lying in bed, lethargic but eyes open, orientated, still has dysarthria. Chronic right UE contracture and right facial droop.   Vitals:   12/25/2018 0700 01/19/2019 0700 01/17/2019 0715 01/23/2019 1100  BP: 123/67 128/90 (!) 122/54 123/75  Pulse: 70 89 70 79  Resp: (!) 24 19 (!) 21 (!) 21  Temp:      TempSrc:      SpO2: 95% 95% 92% 93%  Weight:      Height:        CBC:  Recent Labs  Lab 01/18/2019 0218 12/27/2018 0229  WBC 10.0  --   NEUTROABS 6.3  --   HGB 12.8 12.6  HCT 39.3 37.0  MCV 91.0  --   PLT 269  --     Basic Metabolic Panel:  Recent Labs  Lab 01/10/2019 0218 01/16/2019 0229 12/26/2018 0700  NA 138 136 138  K 3.4* 5.2* 4.3  CL 106 107 109  CO2 22  --  19*  GLUCOSE 147* 146* 124*  BUN 15 16 11   CREATININE 0.73 0.60 0.78  CALCIUM 8.9  --  8.1*  MG  --   --  1.8   Lipid Panel:     Component Value Date/Time   CHOL 129 01/01/2019 0700   TRIG 42 01/07/2019 0700   HDL 67 01/14/2019 0700   CHOLHDL 1.9 01/13/2019 0700   VLDL 8 01/06/2019 0700   LDLCALC 54 01/10/2019 0700   HgbA1c:  Lab Results  Component Value Date   HGBA1C 5.7 (H) 01/22/2019   Urine Drug Screen:     Component Value Date/Time   LABOPIA NONE DETECTED 11/22/2012 1105   COCAINSCRNUR NONE DETECTED 11/22/2012 1105   LABBENZ NONE DETECTED 11/22/2012 1105   AMPHETMU NONE DETECTED 11/22/2012 1105   THCU NONE DETECTED 11/22/2012 1105   LABBARB NONE DETECTED 11/22/2012 1105    Alcohol Level     Component Value Date/Time   [facility] <11 11/22/2012 1217    IMAGING Mr Angio Head Wo Contrast  Result Date: 01/06/2019 CLINICAL DATA:  Acute on chronic stroke symptoms. Right facial droop EXAM: MRI HEAD WITHOUT CONTRAST MRA HEAD WITHOUT CONTRAST TECHNIQUE: Multiplanar, multiecho pulse sequences of the brain and surrounding structures were obtained without intravenous contrast. Angiographic images of the head were obtained using MRA technique without contrast.  COMPARISON:  11/22/2012 FINDINGS: MRI HEAD FINDINGS Brain: Suspected acute lateral medullary infarct on the right, but not seen on coronal diffusion acquisition (which could be due to slice spacing). Remote left pontine infarct with volume loss. Remote left more than right small cerebellar infarcts. Chronic small vessel ischemic gliosis in the cerebral white matter with chronic lacune at the left corona radiata and bilateral thalamus. Dilated perivascular spaces at the deep gray nuclei. Remote micro hemorrhages in the deep brain, likely hypertensive. Small remote left occipital cortex infarct, see coronal T2 weighted imaging Mild for age cerebral volume loss without specific pattern Vascular: Arterial findings below. Normal dural venous sinus flow voids Skull and upper cervical spine: No focal marrow lesion Sinuses/Orbits: Bilateral cataract resection MRA HEAD FINDINGS Left dominant vertebral artery. No visible right PICA, there is a patent right AICA. The left vertebral and basilar arteries are widely patent. Hypoplastic right P1 segment. Symmetric flow in the proximal posterior cerebral arteries with no flow limiting stenosis. A left PCA branch does appear stenotic with progression from 2014. Robust flow in anterior circulation branches. IMPRESSION: Brain MRI: 1. Equivocal for small acute infarct  in the lateral right medulla, suggested on axial diffusion but not on coronal diffusion. 2. Chronic small vessel disease with multiple chronic small infarcts including in the left pons. Intracranial MRA: No significant change since 2014. No emergent finding or proximal flow limiting stenosis. Electronically Signed   By: Monte Fantasia M.D.   On: 01/16/2019 06:42   Mr Brain Wo Contrast  Result Date: 12/28/2018 CLINICAL DATA:  Acute on chronic stroke symptoms. Right facial droop EXAM: MRI HEAD WITHOUT CONTRAST MRA HEAD WITHOUT CONTRAST TECHNIQUE: Multiplanar, multiecho pulse sequences of the brain and surrounding  structures were obtained without intravenous contrast. Angiographic images of the head were obtained using MRA technique without contrast. COMPARISON:  11/22/2012 FINDINGS: MRI HEAD FINDINGS Brain: Suspected acute lateral medullary infarct on the right, but not seen on coronal diffusion acquisition (which could be due to slice spacing). Remote left pontine infarct with volume loss. Remote left more than right small cerebellar infarcts. Chronic small vessel ischemic gliosis in the cerebral white matter with chronic lacune at the left corona radiata and bilateral thalamus. Dilated perivascular spaces at the deep gray nuclei. Remote micro hemorrhages in the deep brain, likely hypertensive. Small remote left occipital cortex infarct, see coronal T2 weighted imaging Mild for age cerebral volume loss without specific pattern Vascular: Arterial findings below. Normal dural venous sinus flow voids Skull and upper cervical spine: No focal marrow lesion Sinuses/Orbits: Bilateral cataract resection MRA HEAD FINDINGS Left dominant vertebral artery. No visible right PICA, there is a patent right AICA. The left vertebral and basilar arteries are widely patent. Hypoplastic right P1 segment. Symmetric flow in the proximal posterior cerebral arteries with no flow limiting stenosis. A left PCA branch does appear stenotic with progression from 2014. Robust flow in anterior circulation branches. IMPRESSION: Brain MRI: 1. Equivocal for small acute infarct in the lateral right medulla, suggested on axial diffusion but not on coronal diffusion. 2. Chronic small vessel disease with multiple chronic small infarcts including in the left pons. Intracranial MRA: No significant change since 2014. No emergent finding or proximal flow limiting stenosis. Electronically Signed   By: Monte Fantasia M.D.   On: 01/08/2019 06:42   Ct Head Code Stroke Wo Contrast  Result Date: 01/11/2019 CLINICAL DATA:  Code stroke. Initial evaluation for acute  right-sided facial droop. EXAM: CT HEAD WITHOUT CONTRAST TECHNIQUE: Contiguous axial images were obtained from the base of the skull through the vertex without intravenous contrast. COMPARISON:  Prior head CT from 12/08/2014. FINDINGS: Brain: Advanced age-related cerebral atrophy. Confluent and patchy hypodensity within the periventricular and deep white matter both cerebral hemispheres most consistent with chronic microvascular ischemic disease, moderate to advanced in nature. Multiple scattered remote lacunar infarcts seen involving the bilateral basal ganglia, thalami, and pons. No acute intracranial hemorrhage. No acute large vessel territory infarct. No mass lesion, midline shift or mass effect. No hydrocephalus. No extra-axial fluid collection. Vascular: No hyperdense vessel. Calcified atherosclerosis at the skull base. Skull: Scalp soft tissues and calvarium within normal limits. Sinuses/Orbits: Globes and orbital soft tissues demonstrate no acute finding. Left maxillary sinus retention cyst. Paranasal sinuses are otherwise clear. No mastoid effusion. Other: None. ASPECTS [facility] Stroke Program Early CT Score) - Ganglionic level infarction (caudate, lentiform nuclei, internal capsule, insula, M1-M3 cortex): 7 - Supraganglionic infarction (M4-M6 cortex): 3 Total score (0-10 with 10 being normal): 10 IMPRESSION: 1. No acute intracranial infarct or other abnormality. 2. ASPECTS is 10. 3. Advanced age-related cerebral atrophy with chronic microvascular ischemic disease. These results were communicated  to Dr. Cheral Marker at 2:37 amon 11/02/2020by text page via the [facility] messaging system. Electronically Signed   By: Jeannine Boga M.D.   On: 12/30/2018 02:39    PHYSICAL EXAM  Temp:  [97.5 F (36.4 C)-99 F (37.2 C)] 99 F (37.2 C) (11/06 1709) Pulse Rate:  [64-101] 96 (11/06 1600) Resp:  [16-29] 20 (11/06 1709) BP: (94-144)/(54-98) 127/72 (11/06 1709) SpO2:  [83 %-97 %] 92 % (11/06 1709) Weight:   [59.9 kg] 59.9 kg (11/06 0250)  General - Well nourished, well developed, in no apparent distress, mildly lethargic.  Ophthalmologic - fundi not visualized due to noncooperation.  Cardiovascular - Regular rhythm and rate, not in afib.  Neuro - mildly lethargic, however, eyes open, orientated to place and self, but not orientated to months.  No aphasia, follows all simple commands, able to name and repeat but moderate dysarthria.  PERRL, EOMI, visual field full.  Right chronic facial droop, tongue midline, seems to have at least some difficulty with oral secretion, concerning for dysphagia.  LUE is 4/5 and bilateral lower extremity 3/5 proximal and 4/5 distally.  However, right upper extremity contracture in flex position, increased muscle tone, spastic.  Sensation subjectively symmetrical, DTR 1+ except right upper extremity not able to test.  Finger-to-nose on the left intact but slow.  Gait not tested.   ASSESSMENT/PLAN Ms. JASPER VANGELDER is a 85 y.o. female with history of previous stroke w/ resultant R HP, HTN, HLD presenting from SNF "not feeling right" and difficulty swallowing.   Stroke:   Small lateral R medually infarct in setting of AF not on AC  CT head No acute abnormality. Small vessel disease. Atrophy. ASPECTS 10.     MRI likely Small lateral R medullary infarct. Multiple old small infarcts including L pons.  MRA head no ELVO. No change since 2014  CTA head & neck unremarkable  2D Echo EF 60 to 65%  LDL 54  HgbA1c 5.7  Lovenox 40 mg sq daily for VTE prophylaxis  aspirin 325 mg daily prior to admission (previously on Eliquis, stopped 05/2018), now on aspirin 325 mg daily or ASA PR 300mg .  Consider resume DOAC once po access.   Therapy recommendations:  pending   Disposition:  pending   Atrial Fibrillation  Found on loop recorder in 09/2013, asymptomatic  Started Eliquis at that time however stopped 05/2018 by Dr. Dorthy Cooler w/ comments of cost >$500?    Currently  rate controlled  On aspirin  Consider resume DOAC once p.o. access  Hx stroke/TIA  09/2012 CT showed old left thalamic infarct  10/2012 -admitted for right facial droop and right-sided weakness MRI showed left pontine, bilateral cerebellum, and left PCA cryptogenic infarcts.  CTA head and neck bilateral ICA bulb atherosclerosis, right fetal PCA and hypoplastic right VA.  DVT negative.  Carotid Doppler negative, EF 60%.  TTE showed positive for PFO.  A1c 6.0 and LDL 23.  Loop recorder placed.Marland Kitchen  Discharged with DAPT.  Residual deficit on the right.  Loop recorder follow-up found to have A. fib 09/2013 and put on Eliquis.     Hypertension  Stable . Permissive hypertension (OK if < 220/120) but gradually normalize in 5-7 days . Long-term BP goal normotensive  Hyperlipidemia  Home meds:  zocor 40  Resume statin in hospital once p.o. access  LDL 54, goal < 70  Continue statin at discharge  Dysphagia . Secondary to stroke . NPO . Did not pass swallow screen . Speech on board . May  need NG tube for meds and nutrition . Consider to resume DOAC and statin once p.o. access   Other Stroke Risk Factors  Advanced age  Former Cigarette smoker, quit 16 yrs ago  Other Active Problems  Hypokalemia   Hospital day # 0  Rosalin Hawking, MD PhD Stroke Neurology 01/01/2019 5:59 PM    To contact Stroke Continuity provider, please refer to [email or web address] After hours, contact General Neurology

## 2018-12-30 NOTE — ED Provider Notes (Signed)
TIME SEEN: 2:13 AM  CHIEF COMPLAINT: Code Stroke   HPI: Patient is an 85 year old female with history of hypertension, hyperlipidemia, previous stroke with right-sided weakness who presents to the emergency department with [facility] as a code stroke.  Last seen normal at 11 PM last night.  She called EMS because she felt like she was having a stroke.  Patient states that she is having right-sided facial numbness and was unable to walk tonight when she is normally able to walk.  Felt like she was having a difficult time swallowing.  Patient comes from [place].  Normal blood pressure with EMS.  Blood glucose 156.     ROS: Level 5 caveat secondary to acuity  PAST MEDICAL HISTORY/PAST SURGICAL HISTORY:  Past Medical History:  Diagnosis Date  . Hemorrhoids   . Hypercholesterolemia   . Hypertension   . Nephrolithiasis   . Osteoporosis   . Stroke ([place])   . Weakness due to cerebrovascular accident    Residual RT sided weakness from previous CVA    MEDICATIONS:  Prior to Admission medications   Medication Sig Start Date End Date Taking? Authorizing Provider  amLODipine (NORVASC) 10 MG tablet Take 10 mg by mouth daily.    [provider]  apixaban (ELIQUIS) 5 MG TABS tablet Take 1 tablet (5 mg total) by mouth 2 (two) times daily. Patient not taking: Reported on 06/02/2018 09/16/15   Evans Lance, MD  calcium carbonate (OS-CAL) 600 MG TABS Take 600 mg by mouth daily.     [provider]  HYDROcodone-acetaminophen (NORCO/VICODIN) 5-325 MG tablet Take 1 tablet by mouth every 6 (six) hours as needed for severe pain. 12/10/14   Elgergawy, Silver Huguenin, MD  losartan (COZAAR) 100 MG tablet Take 100 mg by mouth daily.      [provider]  mirtazapine (REMERON) 30 MG tablet Take 30 mg by mouth at bedtime. 10/23/14   [provider]  simvastatin (ZOCOR) 40 MG tablet Take 40 mg by mouth daily.    [provider]  Zoledronic Acid  (RECLAST IV) Inject into the vein. Once a year     [provider]    ALLERGIES:  No Known Allergies  SOCIAL HISTORY:  Social History   Tobacco Use  . Smoking status: Former Smoker    Quit date: 03/04/2002    Years since quitting: 16.8  . Smokeless tobacco: Never Used  Substance Use Topics  . Alcohol use: No    FAMILY HISTORY: Family History  Problem Relation Age of Onset  . Diabetes Father   . Diabetes Brother     EXAM: BP 127/63   Pulse 64   Temp (!) 97.5 F (36.4 C) (Oral)   Resp (!) 21   Ht 5\' 2"  (1.575 m)   Wt 59.9 kg   SpO2 96%   BMI 24.15 kg/m  CONSTITUTIONAL: Alert and oriented and responds appropriately to questions.  Elderly, chronically ill-appearing HEAD: Normocephalic EYES: Conjunctivae clear, pupils appear equal, EOMI ENT: normal nose; moist mucous membranes NECK: Supple, no meningismus, no nuchal rigidity, no LAD  CARD: RRR; S1 and S2 appreciated; no murmurs, no clicks, no rubs, no gallops RESP: Normal chest excursion without splinting or tachypnea; breath sounds clear and equal bilaterally; no wheezes, no rhonchi, no rales, no hypoxia or respiratory distress, speaking full sentences ABD/GI: Normal bowel sounds; non-distended; soft, non-tender, no rebound, no guarding, no peritoneal signs, no hepatosplenomegaly BACK:  The back appears normal and is non-tender to  palpation, there is no CVA tenderness EXT: Normal ROM in all joints; non-tender to palpation; no edema; normal capillary refill; no cyanosis, no calf tenderness or swelling    SKIN: Normal color for age and race; warm; no rash NEURO: Weakness in the right upper extremity which is chronic, reported left-sided numbness on exam that has improved, patient has right-sided facial droop and mild dysarthria but is unclear if this is chronic, NIH stroke scale 5 PSYCH: The patient's mood and manner are appropriate. Grooming and personal hygiene are appropriate.  MEDICAL DECISION MAKING: CT had  unremarkable.  Dr. Cheral Marker with neurology at bedside upon patient's arrival with EMS.  Appreciate his help.  He does not feel the patient is a TPA candidate given minor symptoms and that the risk outweigh benefit.  Labs, urine pending.  EKG does show a prolonged QT interval.  Will check electrolytes and repeat EKG and avoid any offending medications.  Will admit to medicine for stroke work-up.  ED PROGRESS: 3:18 AM Discussed patient's case with hospitalist, Dr. Marlowe Sax.  I have recommended admission and patient (and family if present) agree with this plan. Admitting physician will place admission orders.   I reviewed all nursing notes, vitals, pertinent previous records and interpreted all EKGs, lab and urine results, imaging (as available).      Date: 01/10/2019 2:42 AM  Rate: 67  Rhythm: normal sinus rhythm  QRS Axis: normal  Intervals: Long QT interval of 654 ms  ST/T Wave abnormalities: normal  Conduction Disutrbances: none  Narrative Interpretation: Diffuse T wave flattening, no changes compared to previous EKG, prolonged QTc interval of 654 ms   Date: 01/10/2019 3:00 AM  Rate: 68  Rhythm: normal sinus rhythm  QRS Axis: normal  Intervals: normal  ST/T Wave abnormalities: normal  Conduction Disutrbances: none  Narrative Interpretation: Diffuse T wave flattening, QTc interval is now 462 ms     CRITICAL CARE Performed by: Pryor Curia   Total critical care time: 36 minutes  Critical care time was exclusive of separately billable procedures and treating other patients.  Critical care was necessary to treat or prevent imminent or life-threatening deterioration.  Critical care was time spent personally by me on the following activities: development of treatment plan with patient and/or surrogate as well as nursing, discussions with consultants, evaluation of patient's response to treatment, examination of patient, obtaining history from patient or surrogate, ordering and  performing treatments and interventions, ordering and review of laboratory studies, ordering and review of radiographic studies, pulse oximetry and re-evaluation of patient's condition.    LASHONIA HENJUM was evaluated in Emergency Department on 01/17/2019 for the symptoms described in the history of present illness. She was evaluated in the context of the global COVID-19 pandemic, which necessitated consideration that the patient might be at risk for infection with the SARS-CoV-2 virus that causes COVID-19. Institutional protocols and algorithms that pertain to the evaluation of patients at risk for COVID-19 are in a state of rapid change based on information released by regulatory bodies including the CDC and federal and state organizations. These policies and algorithms were followed during the patient's care in the ED.    Blaine Hari, Delice Bison, DO 12/30/18 [id number]

## 2018-12-31 LAB — CBC WITH DIFFERENTIAL/PLATELET
Abs Immature Granulocytes: 0.11 10*3/uL — ABNORMAL HIGH (ref 0.00–0.07)
Basophils Absolute: 0 10*3/uL (ref 0.0–0.1)
Basophils Relative: 0 %
Eosinophils Absolute: 0.1 10*3/uL (ref 0.0–0.5)
Eosinophils Relative: 0 %
HCT: 36.9 % (ref 36.0–46.0)
Hemoglobin: 12 g/dL (ref 12.0–15.0)
Immature Granulocytes: 1 %
Lymphocytes Relative: 3 %
Lymphs Abs: 0.7 10*3/uL (ref 0.7–4.0)
MCH: 30.1 pg (ref 26.0–34.0)
MCHC: 32.5 g/dL (ref 30.0–36.0)
MCV: 92.5 fL (ref 80.0–100.0)
Monocytes Absolute: 0.9 10*3/uL (ref 0.1–1.0)
Monocytes Relative: 4 %
Neutro Abs: 19.7 10*3/uL — ABNORMAL HIGH (ref 1.7–7.7)
Neutrophils Relative %: 92 %
Platelets: 321 10*3/uL (ref 150–400)
RBC: 3.99 MIL/uL (ref 3.87–5.11)
RDW: 13 % (ref 11.5–15.5)
WBC: 21.6 10*3/uL — ABNORMAL HIGH (ref 4.0–10.5)
nRBC: 0 % (ref 0.0–0.2)

## 2018-12-31 LAB — BASIC METABOLIC PANEL
Anion gap: 9 (ref 5–15)
BUN: 21 mg/dL (ref 8–23)
CO2: 23 mmol/L (ref 22–32)
Calcium: 8.3 mg/dL — ABNORMAL LOW (ref 8.9–10.3)
Chloride: 105 mmol/L (ref 98–111)
Creatinine, Ser: 0.73 mg/dL (ref 0.44–1.00)
GFR calc Af Amer: >60 mL/min (ref >60)
GFR calc non Af Amer: >60 mL/min (ref >60)
Glucose, Bld: 159 mg/dL — ABNORMAL HIGH (ref 70–99)
Potassium: 3.8 mmol/L (ref 3.5–5.1)
Sodium: 137 mmol/L (ref 135–145)

## 2018-12-31 LAB — MAGNESIUM: Magnesium: 1.8 mg/dL (ref 1.7–2.4)

## 2019-01-24 NOTE — Progress Notes (Signed)
Received phone call at approximately 1930 regarding change in pt mental status and an episode of hematemesis. On assessment, pt had agonal breathing and had minimal response to verbal/physical stimuli. VS remained stable.    Hematemesis - CBC ordered  - CT of abdomen ordered for abdominal distention.  - Hold aspirin until CBC reviewed.    SOB - Chest xray ordered. It is believed pt may have aspirated on emesis.  - Hold PO meds  At [id number], received additional phone call that pt had passed away. Called nephew and informed him of events. Death certificate filled out and left at bedside.   Lovey Newcomer, NP [facility] 7p-7a [phone]

## 2019-01-24 NOTE — Progress Notes (Signed)
Writer received call from telemetry stating patient bradycardic. Upon arrival, patient unable to arouse, monitor showing heart rate in 30. Patient showing no signs of breath, no lung sounds, no apical pulse. Charge nurse Publishing rights manager at bedside agreed no lung sounds, no apical pulse. Lovey Newcomer NP page and notified.

## 2019-01-24 NOTE — Progress Notes (Addendum)
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 Patient on 5L [place] at 92%. No new episodes of emesis. Will continue to monitor.

## 2019-01-24 NOTE — Plan of Care (Signed)
  Problem: Education: Goal: Knowledge of disease or condition will improve Outcome: Progressing Goal: Knowledge of secondary prevention will improve Outcome: Progressing   Problem: Coping: Goal: Will verbalize positive feelings about self Outcome: Progressing   Problem: Self-Care: Goal: Ability to participate in self-care as condition permits will improve Outcome: Progressing Goal: Ability to communicate needs accurately will improve Outcome: Progressing   Problem: Nutrition: Goal: Risk of aspiration will decrease Outcome: Progressing   Problem: Education: Goal: Knowledge of General Education information will improve Description: Including pain rating scale, medication(s)/side effects and non-pharmacologic comfort measures Outcome: Progressing   Problem: Health Behavior/Discharge Planning: Goal: Ability to manage health-related needs will improve Outcome: Progressing   Problem: Clinical Measurements: Goal: Ability to maintain clinical measurements within normal limits will improve Outcome: Progressing Goal: Will remain free from infection Outcome: Progressing Goal: Diagnostic test results will improve Outcome: Progressing Goal: Respiratory complications will improve Outcome: Progressing Goal: Cardiovascular complication will be avoided Outcome: Progressing   Problem: Activity: Goal: Risk for activity intolerance will decrease Outcome: Progressing   Problem: Nutrition: Goal: Adequate nutrition will be maintained Outcome: Progressing   Problem: Coping: Goal: Level of anxiety will decrease Outcome: Progressing   Problem: Elimination: Goal: Will not experience complications related to bowel motility Outcome: Progressing Goal: Will not experience complications related to urinary retention Outcome: Progressing   Problem: Education: Goal: Knowledge of disease or condition will improve Outcome: Progressing Goal: Knowledge of secondary prevention will  improve Outcome: Progressing Goal: Individualized Educational Video(s) Outcome: Progressing   Problem: Coping: Goal: Will verbalize positive feelings about self Outcome: Progressing Goal: Will identify appropriate support needs Outcome: Progressing   Problem: Health Behavior/Discharge Planning: Goal: Ability to manage health-related needs will improve Outcome: Progressing   Problem: Self-Care: Goal: Ability to participate in self-care as condition permits will improve Outcome: Progressing Goal: Verbalization of feelings and concerns over difficulty with self-care will improve Outcome: Progressing Goal: Ability to communicate needs accurately will improve Outcome: Progressing   Problem: Nutrition: Goal: Risk of aspiration will decrease Outcome: Progressing   Problem: Intracerebral Hemorrhage Tissue Perfusion: Goal: Complications of Intracerebral Hemorrhage will be minimized Outcome: Progressing   Problem: Ischemic Stroke/TIA Tissue Perfusion: Goal: Complications of ischemic stroke/TIA will be minimized Outcome: Progressing

## 2019-01-24 NOTE — Death Summary Note (Signed)
Death Summary  Jane Farley [id number] DOB: 02/12/1933 DOA: 01-25-2019  PCP: Lujean Amel, MD  Admit date: 01-25-2019 Date of Death: 2019/01/26    History of present illness:  85 year old female with history of CVA unspecified with right-sided weakness, right upper extremity contracture and right facial droop, hypertension, hyperlipidemia presented with strokelike symptoms from SNF.  Head CT was negative for acute intracranial abnormality.  Neurology was consulted.  MRI of the brain on showed acute stroke.  During the hospitalization, patient had episodes of vomiting.  Subsequently CT of the abdomen showed possible right lower lobe consolidation.  Patient passed away early in the morning of 2019/01/26.  Final Diagnoses:  Probable cardiorespiratory arrest Acute probable ischemic infarct in a patient with history of unspecified CVA Probable right-sided pneumonia Hypertension Leukocytosis  The results of significant diagnostics from this hospitalization (including imaging, microbiology, ancillary and laboratory) are listed below for reference.    Significant Diagnostic Studies: Ct Abdomen Pelvis Wo Contrast  Result Date: 2019/01/25 CLINICAL DATA:  Abdominal distension EXAM: CT ABDOMEN AND PELVIS WITHOUT CONTRAST TECHNIQUE: Multidetector CT imaging of the abdomen and pelvis was performed following the standard protocol without IV contrast. COMPARISON:  12/08/2014 FINDINGS: Lower chest: There is right lower lobe consolidation. There is bibasilar atelectasis.The heart size is mildly enlarged. Coronary artery calcifications are noted. Advanced atherosclerotic changes are noted of the thoracic aorta. Hepatobiliary: The liver is normal. There is hyperdense material within the gallbladder lumen which may be secondary to a prior contrast enhanced examination gallbladder sludge. There is no biliary ductal dilation. Pancreas: Normal contours without ductal dilatation. No peripancreatic fluid  collection. Spleen: No splenic laceration or hematoma. Adrenals/Urinary Tract: --Adrenal glands: No adrenal hemorrhage. --Right kidney/ureter: No hydronephrosis or perinephric hematoma. --Left kidney/ureter: No hydronephrosis or perinephric hematoma. --Urinary bladder: Unremarkable. Stomach/Bowel: --Stomach/Duodenum: No hiatal hernia or other gastric abnormality. Normal duodenal course and caliber. --Small bowel: No dilatation or inflammation. --Colon: Rectosigmoid diverticulosis without acute inflammation. --Appendix: Normal. Vascular/Lymphatic: There are advanced atherosclerotic changes of the abdominal aorta. There is an infrarenal abdominal aortic aneurysm measuring 3.5 cm. --No retroperitoneal lymphadenopathy. --No mesenteric lymphadenopathy. --No pelvic or inguinal lymphadenopathy. Reproductive: Unremarkable Other: No ascites or free air. There is a ventral wall hernia containing a loop of the transverse colon without evidence for an obstruction. Musculoskeletal. There is an age-indeterminate compression fracture of the L1 vertebral body, favored to be chronic. There are degenerative changes throughout the remaining portions of the thoracolumbar spine. IMPRESSION: 1. Right lower lobe consolidation, concerning for pneumonia. 2. Infrarenal abdominal aortic aneurysm measuring 3.5 cm. 3. Rectosigmoid diverticulosis without acute inflammation. 4. Age-indeterminate compression fracture of the L1 vertebral body, favored to be chronic. 5. Aortic atherosclerosis. Aortic Atherosclerosis (ICD10-I70.0). Electronically Signed   By: Constance Holster M.D.   On: 2019/01/25 23:54   Ct Angio Head W Or Wo Contrast  Result Date: 2019/01/25 CLINICAL DATA:  Stroke, follow-up EXAM: CT ANGIOGRAPHY HEAD AND NECK TECHNIQUE: Multidetector CT imaging of the head and neck was performed using the standard protocol during bolus administration of intravenous contrast. Multiplanar CT image reconstructions and MIPs were obtained to  evaluate the vascular anatomy. Carotid stenosis measurements (when applicable) are obtained utilizing NASCET criteria, using the distal internal carotid diameter as the denominator. CONTRAST:  150mL OMNIPAQUE IOHEXOL 350 MG/ML SOLN COMPARISON:  CTA 10/12/2012 correlation made with MRI Jan 25, 2019 FINDINGS: CTA NECK FINDINGS Aortic arch: Moderate calcified and noncalcified plaque the arch. There is a focal area of plaque ulceration on series 5, image 136. [employer]  vessel origins are patent. Right carotid system: Common, internal, and external arteries are patent. There is mild multifocal calcified plaque along the common carotid. Calcified plaque is also present at the bifurcation and along the proximal ICA with minimal measurable stenosis. There is some tortuosity of the distal cervical ICA. Left carotid system: Common, internal, and external arteries are patent. There is mild multifocal calcified plaque along the common carotid. Calcified plaque is also present at the bifurcation and along the proximal ICA with minimal measurable stenosis. Vertebral arteries: Patent. Left vertebral artery is dominant. No significant stenosis or evidence of dissection. Skeleton: Degenerative changes of the cervical spine. Other neck: No neck mass or adenopathy. Upper chest: No apical lung mass. Review of the MIP images confirms the above findings CTA HEAD FINDINGS Anterior circulation: Intracranial internal carotid arteries are patent with mild calcified plaque along the cavernous and paraclinoid portions. Anterior and middle cerebral arteries are patent. Posterior circulation: Intracranial vertebral arteries are patent. The nondominant right vertebral artery terminates as a PICA. Basilar artery is patent and terminates as the left posterior cerebral artery. There is fetal origin of the right posterior cerebral artery. Venous sinuses: Not well evaluated due to contrast timing. Anatomic variants: As described previously. Otherwise  none significant. Review of the MIP images confirms the above findings IMPRESSION: No large vessel occlusion or significant stenosis. Similar appearance to 2014 study. Electronically Signed   By: Macy Mis M.D.   On: 01/06/2019 13:20   Ct Angio Neck W Or Wo Contrast  Result Date: 01/07/2019 CLINICAL DATA:  Stroke, follow-up EXAM: CT ANGIOGRAPHY HEAD AND NECK TECHNIQUE: Multidetector CT imaging of the head and neck was performed using the standard protocol during bolus administration of intravenous contrast. Multiplanar CT image reconstructions and MIPs were obtained to evaluate the vascular anatomy. Carotid stenosis measurements (when applicable) are obtained utilizing NASCET criteria, using the distal internal carotid diameter as the denominator. CONTRAST:  193mL OMNIPAQUE IOHEXOL 350 MG/ML SOLN COMPARISON:  CTA 10/12/2012 correlation made with MRI December 30 2018 FINDINGS: CTA NECK FINDINGS Aortic arch: Moderate calcified and noncalcified plaque the arch. There is a focal area of plaque ulceration on series 5, image 136. Great vessel origins are patent. Right carotid system: Common, internal, and external arteries are patent. There is mild multifocal calcified plaque along the common carotid. Calcified plaque is also present at the bifurcation and along the proximal ICA with minimal measurable stenosis. There is some tortuosity of the distal cervical ICA. Left carotid system: Common, internal, and external arteries are patent. There is mild multifocal calcified plaque along the common carotid. Calcified plaque is also present at the bifurcation and along the proximal ICA with minimal measurable stenosis. Vertebral arteries: Patent. Left vertebral artery is dominant. No significant stenosis or evidence of dissection. Skeleton: Degenerative changes of the cervical spine. Other neck: No neck mass or adenopathy. Upper chest: No apical lung mass. Review of the MIP images confirms the above findings CTA HEAD  FINDINGS Anterior circulation: Intracranial internal carotid arteries are patent with mild calcified plaque along the cavernous and paraclinoid portions. Anterior and middle cerebral arteries are patent. Posterior circulation: Intracranial vertebral arteries are patent. The nondominant right vertebral artery terminates as a PICA. Basilar artery is patent and terminates as the left posterior cerebral artery. There is fetal origin of the right posterior cerebral artery. Venous sinuses: Not well evaluated due to contrast timing. Anatomic variants: As described previously. Otherwise none significant. Review of the MIP images confirms the above findings IMPRESSION: No  large vessel occlusion or significant stenosis. Similar appearance to 2014 study. Electronically Signed   By: Macy Mis M.D.   On: 12/29/2018 13:20   Mr Angio Head Wo Contrast  Result Date: 01/07/2019 CLINICAL DATA:  Acute on chronic stroke symptoms. Right facial droop EXAM: MRI HEAD WITHOUT CONTRAST MRA HEAD WITHOUT CONTRAST TECHNIQUE: Multiplanar, multiecho pulse sequences of the brain and surrounding structures were obtained without intravenous contrast. Angiographic images of the head were obtained using MRA technique without contrast. COMPARISON:  11/22/2012 FINDINGS: MRI HEAD FINDINGS Brain: Suspected acute lateral medullary infarct on the right, but not seen on coronal diffusion acquisition (which could be due to slice spacing). Remote left pontine infarct with volume loss. Remote left more than right small cerebellar infarcts. Chronic small vessel ischemic gliosis in the cerebral white matter with chronic lacune at the left corona radiata and bilateral thalamus. Dilated perivascular spaces at the deep gray nuclei. Remote micro hemorrhages in the deep brain, likely hypertensive. Small remote left occipital cortex infarct, see coronal T2 weighted imaging Mild for age cerebral volume loss without specific pattern Vascular: Arterial findings  below. Normal dural venous sinus flow voids Skull and upper cervical spine: No focal marrow lesion Sinuses/Orbits: Bilateral cataract resection MRA HEAD FINDINGS Left dominant vertebral artery. No visible right PICA, there is a patent right AICA. The left vertebral and basilar arteries are widely patent. Hypoplastic right P1 segment. Symmetric flow in the proximal posterior cerebral arteries with no flow limiting stenosis. A left PCA branch does appear stenotic with progression from 2014. Robust flow in anterior circulation branches. IMPRESSION: Brain MRI: 1. Equivocal for small acute infarct in the lateral right medulla, suggested on axial diffusion but not on coronal diffusion. 2. Chronic small vessel disease with multiple chronic small infarcts including in the left pons. Intracranial MRA: No significant change since 2014. No emergent finding or proximal flow limiting stenosis. Electronically Signed   By: Monte Fantasia M.D.   On: 01/06/2019 06:42   Mr Brain Wo Contrast  Result Date: 01/05/2019 CLINICAL DATA:  Acute on chronic stroke symptoms. Right facial droop EXAM: MRI HEAD WITHOUT CONTRAST MRA HEAD WITHOUT CONTRAST TECHNIQUE: Multiplanar, multiecho pulse sequences of the brain and surrounding structures were obtained without intravenous contrast. Angiographic images of the head were obtained using MRA technique without contrast. COMPARISON:  11/22/2012 FINDINGS: MRI HEAD FINDINGS Brain: Suspected acute lateral medullary infarct on the right, but not seen on coronal diffusion acquisition (which could be due to slice spacing). Remote left pontine infarct with volume loss. Remote left more than right small cerebellar infarcts. Chronic small vessel ischemic gliosis in the cerebral white matter with chronic lacune at the left corona radiata and bilateral thalamus. Dilated perivascular spaces at the deep gray nuclei. Remote micro hemorrhages in the deep brain, likely hypertensive. Small remote left occipital  cortex infarct, see coronal T2 weighted imaging Mild for age cerebral volume loss without specific pattern Vascular: Arterial findings below. Normal dural venous sinus flow voids Skull and upper cervical spine: No focal marrow lesion Sinuses/Orbits: Bilateral cataract resection MRA HEAD FINDINGS Left dominant vertebral artery. No visible right PICA, there is a patent right AICA. The left vertebral and basilar arteries are widely patent. Hypoplastic right P1 segment. Symmetric flow in the proximal posterior cerebral arteries with no flow limiting stenosis. A left PCA branch does appear stenotic with progression from 2014. Robust flow in anterior circulation branches. IMPRESSION: Brain MRI: 1. Equivocal for small acute infarct in the lateral right medulla, suggested on axial  diffusion but not on coronal diffusion. 2. Chronic small vessel disease with multiple chronic small infarcts including in the left pons. Intracranial MRA: No significant change since 2014. No emergent finding or proximal flow limiting stenosis. Electronically Signed   By: Monte Fantasia M.D.   On: 01/18/2019 06:42   Dg Chest Port 1 View  Result Date: 01/07/2019 CLINICAL DATA:  Shortness of breath EXAM: PORTABLE CHEST 1 VIEW COMPARISON:  CT chest dated 12/08/2014 FINDINGS: Mild patchy opacities in the right upper lobe, left upper lobe/lingula, and right lower lobe, suspicious for pneumonia. No pleural effusion or pneumothorax. Cardiomegaly.  Thoracic aortic atherosclerosis. IMPRESSION: Multifocal pneumonia. Electronically Signed   By: Julian Hy M.D.   On: 01/01/2019 20:27   Ct Head Code Stroke Wo Contrast  Result Date: 01/07/2019 CLINICAL DATA:  Code stroke. Initial evaluation for acute right-sided facial droop. EXAM: CT HEAD WITHOUT CONTRAST TECHNIQUE: Contiguous axial images were obtained from the base of the skull through the vertex without intravenous contrast. COMPARISON:  Prior head CT from 12/08/2014. FINDINGS: Brain:  Advanced age-related cerebral atrophy. Confluent and patchy hypodensity within the periventricular and deep white matter both cerebral hemispheres most consistent with chronic microvascular ischemic disease, moderate to advanced in nature. Multiple scattered remote lacunar infarcts seen involving the bilateral basal ganglia, thalami, and pons. No acute intracranial hemorrhage. No acute large vessel territory infarct. No mass lesion, midline shift or mass effect. No hydrocephalus. No extra-axial fluid collection. Vascular: No hyperdense vessel. Calcified atherosclerosis at the skull base. Skull: Scalp soft tissues and calvarium within normal limits. Sinuses/Orbits: Globes and orbital soft tissues demonstrate no acute finding. Left maxillary sinus retention cyst. Paranasal sinuses are otherwise clear. No mastoid effusion. Other: None. ASPECTS [facility] Stroke Program Early CT Score) - Ganglionic level infarction (caudate, lentiform nuclei, internal capsule, insula, M1-M3 cortex): 7 - Supraganglionic infarction (M4-M6 cortex): 3 Total score (0-10 with 10 being normal): 10 IMPRESSION: 1. No acute intracranial infarct or other abnormality. 2. ASPECTS is 10. 3. Advanced age-related cerebral atrophy with chronic microvascular ischemic disease. These results were communicated to Dr. Cheral Marker at 2:37 amon 11/08/2020by text page via the [facility] messaging system. Electronically Signed   By: Jeannine Boga M.D.   On: 12/30/2018 02:39    Microbiology: Recent Results (from the past 240 hour(s))  SARS CORONAVIRUS 2 (TAT 6-24 HRS) Nasopharyngeal Nasopharyngeal Swab     Status: None   Collection Time: 12/30/18  3:51 AM   Specimen: Nasopharyngeal Swab  Result Value Ref Range Status   SARS Coronavirus 2 NEGATIVE NEGATIVE Final    Comment: (NOTE) SARS-CoV-2 target nucleic acids are NOT DETECTED. The SARS-CoV-2 RNA is generally detectable in upper and lower respiratory specimens during the acute phase of infection.  Negative results do not preclude SARS-CoV-2 infection, do not rule out co-infections with other pathogens, and should not be used as the sole basis for treatment or other patient management decisions. Negative results must be combined with clinical observations, patient history, and epidemiological information. The expected result is Negative. Fact Sheet for Patients: SugarRoll.be Fact Sheet for Healthcare Providers: https://www.woods-mathews.com/ This test is not yet approved or cleared by the [place] FDA and  has been authorized for detection and/or diagnosis of SARS-CoV-2 by FDA under an Emergency Use Authorization (EUA). This EUA will remain  in effect (meaning this test can be used) for the duration of the COVID-19 declaration under Section 56 4(b)(1) of the Act, 21 U.S.C. section 360bbb-3(b)(1), unless the authorization is terminated or revoked sooner. Performed at  [facility], [place] [address] [place], [place] [address]   MRSA PCR Screening     Status: None   Collection Time: 01/08/2019  4:51 PM   Specimen: Nasopharyngeal  Result Value Ref Range Status   MRSA by PCR NEGATIVE NEGATIVE Final    Comment:        The GeneXpert MRSA Assay (FDA approved for NASAL specimens only), is one component of a comprehensive MRSA colonization surveillance program. It is not intended to diagnose MRSA infection nor to guide or monitor treatment for MRSA infections. Performed at [facility], [place] [address], [place], [place] [address]      Labs: Basic Metabolic Panel: Recent Labs  Lab 01/21/2019 0218 01/09/2019 0229 01/04/2019 0700 January 25, 2019 0241  NA 138 136 138 137  K 3.4* 5.2* 4.3 3.8  CL 106 107 109 105  CO2 22  --  19* 23  GLUCOSE 147* 146* 124* 159*  BUN 15 16 11 21   CREATININE 0.73 0.60 0.78 0.73  CALCIUM 8.9  --  8.1* 8.3*  MG  --   --  1.8 1.8   Liver Function Tests: Recent Labs  Lab 01/15/2019 0218  AST 14*    ALT 9  ALKPHOS 55  BILITOT 0.5  PROT 6.5  ALBUMIN 3.4*   No results for input(s): LIPASE, AMYLASE in the last 168 hours. No results for input(s): AMMONIA in the last 168 hours. CBC: Recent Labs  Lab 12/29/2018 0218 01/06/2019 0229 12/25/2018 2036 2019/01/25 0241  WBC 10.0  --  20.6* 21.6*  NEUTROABS 6.3  --  19.1* 19.7*  HGB 12.8 12.6 13.6 12.0  HCT 39.3 37.0 36.8 36.9  MCV 91.0  --  94.4 92.5  PLT 269  --  310 321   Cardiac Enzymes: No results for input(s): CKTOTAL, CKMB, CKMBINDEX, TROPONINI in the last 168 hours. D-Dimer No results for input(s): DDIMER in the last 72 hours. BNP: Invalid input(s): POCBNP CBG: Recent Labs  Lab 01/22/2019 0215 12/29/2018 0242  GLUCAP 137* 134*   Anemia work up No results for input(s): VITAMINB12, FOLATE, FERRITIN, TIBC, IRON, RETICCTPCT in the last 72 hours. Urinalysis    Component Value Date/Time   COLORURINE YELLOW 01/21/2019 1212   APPEARANCEUR CLOUDY (A) 01/21/2019 1212   LABSPEC 1.018 01/12/2019 1212   PHURINE 7.0 01/06/2019 1212   GLUCOSEU 50 (A) 01/08/2019 1212   HGBUR MODERATE (A) 12/29/2018 1212   BILIRUBINUR NEGATIVE 01/17/2019 1212   KETONESUR 5 (A) 01/14/2019 1212   PROTEINUR NEGATIVE 01/15/2019 1212   UROBILINOGEN 0.2 12/09/2014 0115   NITRITE NEGATIVE 01/03/2019 1212   LEUKOCYTESUR SMALL (A) 01/20/2019 1212   Sepsis Labs Invalid input(s): PROCALCITONIN,  WBC,  LACTICIDVEN     SIGNED:  Aline August, MD  [facility] 01/25/19, 1:09 PM

## 2019-01-24 DEATH — deceased
